# Patient Record
Sex: Male | Born: 1970 | Race: Black or African American | Hispanic: No | Marital: Single | Smoking: Former smoker
Health system: Southern US, Community
[De-identification: ages and names within clinical notes are randomized; demographics above are authoritative.]

## PROBLEM LIST (undated history)

## (undated) DIAGNOSIS — B20 Human immunodeficiency virus [HIV] disease: Secondary | ICD-10-CM

## (undated) DIAGNOSIS — Z21 Asymptomatic human immunodeficiency virus [HIV] infection status: Secondary | ICD-10-CM

## (undated) DIAGNOSIS — G629 Polyneuropathy, unspecified: Secondary | ICD-10-CM

## (undated) HISTORY — DX: Polyneuropathy, unspecified: G62.9

---

## 2021-06-12 ENCOUNTER — Emergency Department (HOSPITAL_BASED_OUTPATIENT_CLINIC_OR_DEPARTMENT_OTHER): Payer: Self-pay

## 2021-06-12 ENCOUNTER — Inpatient Hospital Stay (HOSPITAL_BASED_OUTPATIENT_CLINIC_OR_DEPARTMENT_OTHER)
Admission: EM | Admit: 2021-06-12 | Discharge: 2021-06-17 | DRG: 974 | Disposition: A | Discharge status: Home / Self Care | Payer: Self-pay | Attending: Internal Medicine | Admitting: Internal Medicine

## 2021-06-12 ENCOUNTER — Other Ambulatory Visit: Payer: Self-pay

## 2021-06-12 ENCOUNTER — Encounter (HOSPITAL_BASED_OUTPATIENT_CLINIC_OR_DEPARTMENT_OTHER): Payer: Self-pay

## 2021-06-12 DIAGNOSIS — H3322 Serous retinal detachment, left eye: Secondary | ICD-10-CM | POA: Diagnosis present

## 2021-06-12 DIAGNOSIS — T375X6A Underdosing of antiviral drugs, initial encounter: Secondary | ICD-10-CM | POA: Diagnosis present

## 2021-06-12 DIAGNOSIS — E538 Deficiency of other specified B group vitamins: Secondary | ICD-10-CM | POA: Diagnosis present

## 2021-06-12 DIAGNOSIS — Z8611 Personal history of tuberculosis: Secondary | ICD-10-CM

## 2021-06-12 DIAGNOSIS — J9601 Acute respiratory failure with hypoxia: Secondary | ICD-10-CM | POA: Diagnosis present

## 2021-06-12 DIAGNOSIS — E8809 Other disorders of plasma-protein metabolism, not elsewhere classified: Secondary | ICD-10-CM | POA: Diagnosis present

## 2021-06-12 DIAGNOSIS — R7989 Other specified abnormal findings of blood chemistry: Secondary | ICD-10-CM

## 2021-06-12 DIAGNOSIS — Z79899 Other long term (current) drug therapy: Secondary | ICD-10-CM

## 2021-06-12 DIAGNOSIS — H30892 Other chorioretinal inflammations, left eye: Secondary | ICD-10-CM | POA: Diagnosis present

## 2021-06-12 DIAGNOSIS — A419 Sepsis, unspecified organism: Principal | ICD-10-CM | POA: Diagnosis present

## 2021-06-12 DIAGNOSIS — Z91138 Patient's unintentional underdosing of medication regimen for other reason: Secondary | ICD-10-CM

## 2021-06-12 DIAGNOSIS — H5462 Unqualified visual loss, left eye, normal vision right eye: Secondary | ICD-10-CM | POA: Diagnosis present

## 2021-06-12 DIAGNOSIS — H3090 Unspecified chorioretinal inflammation, unspecified eye: Secondary | ICD-10-CM | POA: Diagnosis present

## 2021-06-12 DIAGNOSIS — Z20822 Contact with and (suspected) exposure to covid-19: Secondary | ICD-10-CM | POA: Diagnosis present

## 2021-06-12 DIAGNOSIS — J189 Pneumonia, unspecified organism: Secondary | ICD-10-CM

## 2021-06-12 DIAGNOSIS — B20 Human immunodeficiency virus [HIV] disease: Secondary | ICD-10-CM | POA: Diagnosis present

## 2021-06-12 DIAGNOSIS — R7401 Elevation of levels of liver transaminase levels: Secondary | ICD-10-CM

## 2021-06-12 DIAGNOSIS — E861 Hypovolemia: Secondary | ICD-10-CM | POA: Diagnosis present

## 2021-06-12 DIAGNOSIS — D75838 Other thrombocytosis: Secondary | ICD-10-CM | POA: Diagnosis present

## 2021-06-12 DIAGNOSIS — Z888 Allergy status to other drugs, medicaments and biological substances status: Secondary | ICD-10-CM

## 2021-06-12 DIAGNOSIS — J850 Gangrene and necrosis of lung: Secondary | ICD-10-CM | POA: Diagnosis present

## 2021-06-12 DIAGNOSIS — H547 Unspecified visual loss: Secondary | ICD-10-CM

## 2021-06-12 DIAGNOSIS — E876 Hypokalemia: Secondary | ICD-10-CM

## 2021-06-12 DIAGNOSIS — D649 Anemia, unspecified: Secondary | ICD-10-CM | POA: Diagnosis present

## 2021-06-12 DIAGNOSIS — B0089 Other herpesviral infection: Secondary | ICD-10-CM | POA: Diagnosis present

## 2021-06-12 DIAGNOSIS — E871 Hypo-osmolality and hyponatremia: Secondary | ICD-10-CM

## 2021-06-12 DIAGNOSIS — J851 Abscess of lung with pneumonia: Secondary | ICD-10-CM | POA: Diagnosis present

## 2021-06-12 DIAGNOSIS — D638 Anemia in other chronic diseases classified elsewhere: Secondary | ICD-10-CM | POA: Diagnosis present

## 2021-06-12 HISTORY — DX: Asymptomatic human immunodeficiency virus (hiv) infection status: Z21

## 2021-06-12 HISTORY — DX: Human immunodeficiency virus (HIV) disease: B20

## 2021-06-12 LAB — COMPREHENSIVE METABOLIC PANEL
ALT: 125 U/L — ABNORMAL HIGH (ref 0–44)
AST: 116 U/L — ABNORMAL HIGH (ref 15–41)
Albumin: 2 g/dL — ABNORMAL LOW (ref 3.5–5.0)
Alkaline Phosphatase: 134 U/L — ABNORMAL HIGH (ref 38–126)
Anion gap: 10 (ref 5–15)
BUN: 8 mg/dL (ref 6–20)
CO2: 27 mmol/L (ref 22–32)
Calcium: 7.9 mg/dL — ABNORMAL LOW (ref 8.9–10.3)
Chloride: 93 mmol/L — ABNORMAL LOW (ref 98–111)
Creatinine, Ser: 0.72 mg/dL (ref 0.61–1.24)
GFR, Estimated: >60 mL/min (ref >60)
Glucose, Bld: 111 mg/dL — ABNORMAL HIGH (ref 70–99)
Potassium: 3.3 mmol/L — ABNORMAL LOW (ref 3.5–5.1)
Sodium: 130 mmol/L — ABNORMAL LOW (ref 135–145)
Total Bilirubin: 0.6 mg/dL (ref 0.3–1.2)
Total Protein: 7.5 g/dL (ref 6.5–8.1)

## 2021-06-12 LAB — URINALYSIS, ROUTINE W REFLEX MICROSCOPIC
Bilirubin Urine: NEGATIVE
Glucose, UA: NEGATIVE mg/dL
Hgb urine dipstick: NEGATIVE
Ketones, ur: NEGATIVE mg/dL
Leukocytes,Ua: NEGATIVE
Nitrite: NEGATIVE
Protein, ur: 30 mg/dL — AB
Specific Gravity, Urine: 1.025 (ref 1.005–1.030)
pH: 6 (ref 5.0–8.0)

## 2021-06-12 LAB — RESP PANEL BY RT-PCR (FLU A&B, COVID) ARPGX2
Influenza A by PCR: NEGATIVE
Influenza B by PCR: NEGATIVE
SARS Coronavirus 2 by RT PCR: NEGATIVE

## 2021-06-12 LAB — URINALYSIS, MICROSCOPIC (REFLEX)

## 2021-06-12 LAB — T-HELPER CELLS (CD4) COUNT (NOT AT ARMC)
CD4 % Helper T Cell: 5 % — ABNORMAL LOW (ref 33–65)
CD4 T Cell Abs: 37 /uL — ABNORMAL LOW (ref 400–1790)

## 2021-06-12 LAB — CBC WITH DIFFERENTIAL/PLATELET
Abs Immature Granulocytes: 0.15 10*3/uL — ABNORMAL HIGH (ref 0.00–0.07)
Basophils Absolute: 0 10*3/uL (ref 0.0–0.1)
Basophils Relative: 0 %
Eosinophils Absolute: 0.3 10*3/uL (ref 0.0–0.5)
Eosinophils Relative: 2 %
HCT: 24.6 % — ABNORMAL LOW (ref 39.0–52.0)
Hemoglobin: 7.7 g/dL — ABNORMAL LOW (ref 13.0–17.0)
Immature Granulocytes: 1 %
Lymphocytes Relative: 6 %
Lymphs Abs: 0.7 10*3/uL (ref 0.7–4.0)
MCH: 25.9 pg — ABNORMAL LOW (ref 26.0–34.0)
MCHC: 31.3 g/dL (ref 30.0–36.0)
MCV: 82.8 fL (ref 80.0–100.0)
Monocytes Absolute: 0.6 10*3/uL (ref 0.1–1.0)
Monocytes Relative: 5 %
Neutro Abs: 10.9 10*3/uL — ABNORMAL HIGH (ref 1.7–7.7)
Neutrophils Relative %: 86 %
Platelets: 655 10*3/uL — ABNORMAL HIGH (ref 150–400)
RBC: 2.97 MIL/uL — ABNORMAL LOW (ref 4.22–5.81)
RDW: 13.7 % (ref 11.5–15.5)
WBC: 12.6 10*3/uL — ABNORMAL HIGH (ref 4.0–10.5)
nRBC: 0 % (ref 0.0–0.2)

## 2021-06-12 LAB — PROTIME-INR
INR: 1.3 — ABNORMAL HIGH (ref 0.8–1.2)
Prothrombin Time: 15.9 seconds — ABNORMAL HIGH (ref 11.4–15.2)

## 2021-06-12 LAB — EXPECTORATED SPUTUM ASSESSMENT W GRAM STAIN, RFLX TO RESP C

## 2021-06-12 LAB — APTT: aPTT: 38 seconds — ABNORMAL HIGH (ref 24–36)

## 2021-06-12 LAB — LACTIC ACID, PLASMA: Lactic Acid, Venous: 0.9 mmol/L (ref 0.5–1.9)

## 2021-06-12 MED ORDER — SODIUM CHLORIDE 0.9 % IV SOLN
2.0000 g | Freq: Once | INTRAVENOUS | Status: AC
  Administered 2021-06-12: 2 g via INTRAVENOUS
  Filled 2021-06-12: qty 2

## 2021-06-12 MED ORDER — CALCIUM CARBONATE 1250 (500 CA) MG PO TABS
1000.0000 mg | ORAL_TABLET | Freq: Every day | ORAL | Status: DC
  Administered 2021-06-13 – 2021-06-15 (×3): 1000 mg via ORAL
  Filled 2021-06-12 (×3): qty 1

## 2021-06-12 MED ORDER — VANCOMYCIN HCL 1250 MG/250ML IV SOLN
1250.0000 mg | Freq: Two times a day (BID) | INTRAVENOUS | Status: DC
  Administered 2021-06-13 (×2): 1250 mg via INTRAVENOUS
  Filled 2021-06-12 (×4): qty 250

## 2021-06-12 MED ORDER — ACETAMINOPHEN 325 MG PO TABS
650.0000 mg | ORAL_TABLET | Freq: Once | ORAL | Status: AC
  Administered 2021-06-12: 650 mg via ORAL
  Filled 2021-06-12: qty 2

## 2021-06-12 MED ORDER — ENOXAPARIN SODIUM 40 MG/0.4ML IJ SOSY
40.0000 mg | PREFILLED_SYRINGE | INTRAMUSCULAR | Status: DC
  Administered 2021-06-13 – 2021-06-17 (×5): 40 mg via SUBCUTANEOUS
  Filled 2021-06-12 (×5): qty 0.4

## 2021-06-12 MED ORDER — ACETAMINOPHEN 500 MG PO TABS
1000.0000 mg | ORAL_TABLET | Freq: Once | ORAL | Status: AC
  Administered 2021-06-12: 1000 mg via ORAL
  Filled 2021-06-12: qty 2

## 2021-06-12 MED ORDER — SODIUM CHLORIDE 0.9 % IV SOLN: INTRAVENOUS | Status: DC

## 2021-06-12 MED ORDER — SODIUM CHLORIDE 0.9 % IV SOLN
2.0000 g | Freq: Three times a day (TID) | INTRAVENOUS | Status: DC
  Administered 2021-06-12 – 2021-06-13 (×3): 2 g via INTRAVENOUS
  Filled 2021-06-12 (×3): qty 2

## 2021-06-12 MED ORDER — SODIUM CHLORIDE 0.9 % IV BOLUS
1000.0000 mL | Freq: Once | INTRAVENOUS | Status: AC
  Administered 2021-06-12: 1000 mL via INTRAVENOUS

## 2021-06-12 MED ORDER — VANCOMYCIN HCL IN DEXTROSE 1-5 GM/200ML-% IV SOLN
1000.0000 mg | Freq: Once | INTRAVENOUS | Status: AC
  Administered 2021-06-12: 1000 mg via INTRAVENOUS
  Filled 2021-06-12: qty 200

## 2021-06-12 MED ORDER — DEXTROSE 5 % IV SOLN
10.0000 mg/kg | Freq: Three times a day (TID) | INTRAVENOUS | Status: DC
  Administered 2021-06-12: 745 mg via INTRAVENOUS
  Filled 2021-06-12 (×4): qty 14.9

## 2021-06-12 MED ORDER — ACYCLOVIR 800 MG PO TABS
800.0000 mg | ORAL_TABLET | Freq: Three times a day (TID) | ORAL | Status: DC
  Filled 2021-06-12: qty 1

## 2021-06-12 MED ORDER — SODIUM CHLORIDE 0.9 % IV SOLN: INTRAVENOUS | Status: DC | PRN

## 2021-06-12 NOTE — Assessment & Plan Note (Addendum)
Sepsis physiology has resolved-continues to have intermittent fever-which is to be expected given advanced HIV-and multiple ongoing infections-has been restarted on antiretroviral so could have IRIS as well.  Multiple differentials for lung abscess -CMV, recurrent Mycobacterium kansasii infection-and usual bacterial organisms.  Sputum cultures negative-AFB smears negative so far.  Discussed with infectious disease-transition from Unasyn to Augmentin x4 weeks on discharge.  Will need to repeat CT chest post treatment to assess response to treatment.

## 2021-06-12 NOTE — ED Notes (Signed)
Patient transported to CT 

## 2021-06-12 NOTE — ED Notes (Signed)
Report given to [facility] 

## 2021-06-12 NOTE — ED Notes (Signed)
[facility] in to transfer. Report given to [facility] charge nurse

## 2021-06-12 NOTE — ED Provider Notes (Addendum)
[facility] EMERGENCY DEPARTMENT Provider Note   CSN: [id number] Arrival date & time: 06/12/21  1014     History  Chief Complaint  Patient presents with   Blurred Vision   Cough    [name] is a 51 y.o. male.  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  Was getting treatment while incarcerated but he has been out of prison for about a year and has not been able to follow-up with his infectious disease team at [facility].  He is still not been able to get in to see them but maybe has an appointment for April.  He is not happy with their care there.  He has had a bad cough for the last 2 weeks and having smelly brown sputum.  Last week he started to have some blurry vision in his left eye and then eventually completely lost vision in the left eye.  He started to feel the same symptoms in his right eye and now but still has vision in the right eye.  He has no headaches, no neck pain, no eye pain.  He is having some right-sided chest wall pain when he coughs.  He is having chills and body aches at times.  The history is provided by the patient.  Cough Cough characteristics:  Productive Sputum characteristics:  [place] Severity:  Moderate Onset quality:  Gradual Duration:  2 weeks Timing:  Constant Progression:  Unchanged Chronicity:  New Relieved by:  Nothing Worsened by:  Nothing Ineffective treatments:  None tried Associated symptoms: chest pain, chills, fever, myalgias and shortness of breath   Associated symptoms: no diaphoresis, no ear fullness, no ear pain, no eye discharge, no headaches, no rash, no rhinorrhea, no sinus congestion, no sore throat, no weight loss and no wheezing       Home Medications Prior to Admission medications   Not on File      Allergies    Dapsone and Primaquine    Review of Systems   Review of Systems  Constitutional:  Positive for chills and fever. Negative for diaphoresis and weight loss.   HENT:  Negative for ear pain, rhinorrhea and sore throat.   Eyes:  Negative for discharge.  Respiratory:  Positive for cough and shortness of breath. Negative for wheezing.   Cardiovascular:  Positive for chest pain.  Musculoskeletal:  Positive for myalgias.  Skin:  Negative for rash.  Neurological:  Negative for headaches.   Physical Exam Updated Vital Signs  ED Triage Vitals  Enc Vitals Group     BP 06/12/21 1051 (!) 145/76     Pulse Rate 06/12/21 1051 84     Resp 06/12/21 1051 20     Temp 06/12/21 1051 100.3 F (37.9 C)     Temp Source 06/12/21 1051 Oral     SpO2 06/12/21 1051 97 %     Weight 06/12/21 1029 164 lb (74.4 kg)     Height 06/12/21 1029 6' (1.829 m)     Head Circumference --      Peak Flow --      Pain Score 06/12/21 1028 7     Pain Loc --      Pain Edu? --      Excl. in [place]? --     Physical Exam Vitals and nursing note reviewed.  Constitutional:      General: He is not in acute distress.    Appearance: He is well-developed. He  is ill-appearing.  HENT:     Head: Normocephalic and atraumatic.     Nose: Nose normal.     Mouth/Throat:     Mouth: Mucous membranes are moist.  Eyes:     Extraocular Movements: Extraocular movements intact.     Conjunctiva/sclera: Conjunctivae normal.     Comments: He appears to have 20/20 vision in the right eye, he is unable to see light or any movement in the left eye, pupil is black bilaterally, no obvious cataracts, left pupil is minimally reactive and right pupil is reactive and equal to light  Cardiovascular:     Rate and Rhythm: Normal rate and regular rhythm.     Pulses: Normal pulses.     Heart sounds: Normal heart sounds. No murmur heard. Pulmonary:     Effort: No respiratory distress.     Comments: Increased work of breathing with diminished right breath sounds compared to left Abdominal:     Palpations: Abdomen is soft.     Tenderness: There is no abdominal tenderness.  Musculoskeletal:        General:  Tenderness present. No swelling.     Cervical back: Normal range of motion and neck supple.     Comments: Tenderness over right anterior chest wall  Skin:    General: Skin is warm and dry.     Capillary Refill: Capillary refill takes less than 2 seconds.  Neurological:     General: No focal deficit present.     Mental Status: He is alert and oriented to person, place, and time.     Cranial Nerves: No cranial nerve deficit.     Sensory: No sensory deficit.     Motor: No weakness.     Coordination: Coordination normal.     Comments: 5+ out of 5 strength, normal sensation  Psychiatric:        Mood and Affect: Mood normal.    ED Results / Procedures / Treatments   Labs (all labs ordered are listed, but only abnormal results are displayed) Labs Reviewed  CBC WITH DIFFERENTIAL/PLATELET - Abnormal; Notable for the following components:      Result Value   WBC 12.6 (*)    RBC 2.97 (*)    Hemoglobin 7.7 (*)    HCT 24.6 (*)    MCH 25.9 (*)    Platelets 655 (*)    Neutro Abs 10.9 (*)    Abs Immature Granulocytes 0.15 (*)    All other components within normal limits  COMPREHENSIVE METABOLIC PANEL - Abnormal; Notable for the following components:   Sodium 130 (*)    Potassium 3.3 (*)    Chloride 93 (*)    Glucose, Bld 111 (*)    Calcium 7.9 (*)    Albumin 2.0 (*)    AST 116 (*)    ALT 125 (*)    Alkaline Phosphatase 134 (*)    All other components within normal limits  PROTIME-INR - Abnormal; Notable for the following components:   Prothrombin Time 15.9 (*)    INR 1.3 (*)    All other components within normal limits  APTT - Abnormal; Notable for the following components:   aPTT 38 (*)    All other components within normal limits  RESP PANEL BY RT-PCR (FLU A&B, COVID) ARPGX2  CULTURE, BLOOD (ROUTINE X 2)  CULTURE, BLOOD (ROUTINE X 2)  URINE CULTURE  EXPECTORATED SPUTUM ASSESSMENT W GRAM STAIN, RFLX TO RESP C  ACID FAST SMEAR (AFB, MYCOBACTERIA)  ACID FAST  CULTURE WITH  REFLEXED SENSITIVITIES (MYCOBACTERIA)  LACTIC ACID, PLASMA  T-HELPER CELLS (CD4) COUNT (NOT AT [facility])  HIV-1 RNA QUANT-NO REFLEX-BLD  URINALYSIS, ROUTINE W REFLEX MICROSCOPIC  QUANTIFERON-TB GOLD PLUS    EKG EKG Interpretation  Date/Time:  Friday June 12 2021 10:57:11 EST Ventricular Rate:  98 PR Interval:  134 QRS Duration: 94 QT Interval:  341 QTC Calculation: 427 R Axis:   84 Text Interpretation: Sinus rhythm Paired ventricular premature complexes Confirmed by Lennice Sites ([id number]) on 06/12/2021 11:07:02 AM  Radiology CT Head Wo Contrast  Result Date: 06/12/2021 CLINICAL DATA:  Vision loss.  No reported injury. EXAM: CT HEAD WITHOUT CONTRAST TECHNIQUE: Contiguous axial images were obtained from the base of the skull through the vertex without intravenous contrast. RADIATION DOSE REDUCTION: This exam was performed according to the departmental dose-optimization program which includes automated exposure control, adjustment of the mA and/or kV according to patient size and/or use of iterative reconstruction technique. COMPARISON:  10/05/2015 head CT. FINDINGS: Brain: No evidence of parenchymal hemorrhage or extra-axial fluid collection. No mass lesion, mass effect, or midline shift. No CT evidence of acute infarction. Cerebral volume is age appropriate. No ventriculomegaly. Vascular: No acute abnormality. Skull: No evidence of calvarial fracture. Sinuses/Orbits: The visualized paranasal sinuses are essentially clear. Heterogeneous mild hyperdensity is seen layering in the vitreous of the globes bilaterally, left greater than right (series 2/image 5). Other:  The mastoid air cells are unopacified. IMPRESSION: 1. No evidence of acute intracranial abnormality. 2. Heterogeneous mild hyperdensity layering in the vitreous of the globes bilaterally, left greater than right, cannot exclude vitreous hemorrhage. Electronically Signed   By: Ilona Sorrel M.D.   On: 06/12/2021 11:27   DG Chest Portable 1  View  Result Date: 06/12/2021 CLINICAL DATA:  Cough shortness of breath EXAM: PORTABLE CHEST 1 VIEW COMPARISON:  10/05/2015 FINDINGS: There is moderate to large alveolar infiltrate in the medial right lower lung fields. Right cardiac margin is preserved. Rest of the lung fields are clear. There is no pleural effusion or pneumothorax. IMPRESSION: There is new large infiltrate in the right lower lung fields suggesting possible pneumonia in the right lower lobe. Electronically Signed   By: Elmer Picker M.D.   On: 06/12/2021 11:21    Procedures .Critical Care Performed by: Lennice Sites, DO Authorized by: Lennice Sites, DO   Critical care provider statement:    Critical care time (minutes):  45   Critical care was necessary to treat or prevent imminent or life-threatening deterioration of the following conditions:  Sepsis   Critical care was time spent personally by me on the following activities:  Blood draw for specimens, development of treatment plan with patient or surrogate, discussions with consultants, evaluation of patient's response to treatment, examination of patient, obtaining history from patient or surrogate, ordering and performing treatments and interventions, ordering and review of laboratory studies, ordering and review of radiographic studies, pulse oximetry, re-evaluation of patient's condition, review of old charts and discussions with primary provider    Medications Ordered in ED Medications  0.9 %  sodium chloride infusion ( Intravenous New Bag/Given 06/12/21 1210)  ceFEPIme (MAXIPIME) 2 g in sodium chloride 0.9 % 100 mL IVPB (has no administration in time range)  vancomycin (VANCOREADY) IVPB 1250 mg/250 mL (has no administration in time range)  vancomycin (VANCOCIN) IVPB 1000 mg/200 mL premix (0 mg Intravenous Stopped 06/12/21 1347)  ceFEPIme (MAXIPIME) 2 g in sodium chloride 0.9 % 100 mL IVPB (0 g Intravenous Stopped 06/12/21 1347)  acetaminophen (TYLENOL) tablet 1,000 mg  (1,000 mg Oral Given 06/12/21 1142)  sodium chloride 0.9 % bolus 1,000 mL (0 mLs Intravenous Stopped 06/12/21 1348)    ED Course/ Medical Decision Making/ A&P                           Medical Decision Making Amount and/or Complexity of Data Reviewed Labs: ordered. Radiology: ordered. ECG/medicine tests: ordered.  Risk OTC drugs. Prescription drug management. Decision regarding hospitalization.   Muzamil Kawecki is here with cough and vision loss.  Patient with history of HIV but no longer on treatment for the past year.  Arrives with fever of 100.7.  Hypoxic to the 80s that improved on 2 L of oxygen.  But otherwise normal vitals.  Patient states that he was getting HIV treatment in prison but when he was released a year ago he has not been able to follow-up with any medical providers.  Over the last 2 weeks has had cough with smelly brown sputum production.  Last week he lost vision in his left eye.  He was having several days of some blurred vision and now he is unable to see any light out of his left eye.  He started to feel the same symptoms in the right eye but still is able to see.  He denies any headache, eye pain, abdominal pain, nausea, vomiting.  But he has had chills and body aches.  He has had pain to the right side of his ribs when he coughs.  On exam he is unable to see light in the left eye.  Pupils minimally reactive.  Maybe there is some corneal clouding.  Hard to get a good funduscopic exam.  Right eye he appears to have 20/20 vision but he states he is having some difficulty focusing but does not appear to have double vision on testing.  Patient otherwise neurologically intact.  He has no neck stiffness and normal strength and sensation throughout.  Normal speech.  Normal extraocular movement.  Diminished breath sounds more so on the right.  Overall given fever and immunocompromise state likely in history of tuberculosis we will get sepsis work-up and start broad-spectrum IV  antibiotics with vancomycin and cefepime.  Patient's is able to produce sputum and we will send that off for evaluation.  We will also send off HIV testing as well as QuantiFERON testing.  Patient to get a head CT given vision loss.  I have a lower suspicion for stroke.  Differential includes sepsis likely from pulmonary source versus acute HIV versus tuberculosis.  Patient possibly with some sort of HIV related vision loss, will touch base with ophthalmology.  May need an MRI to fully rule out stroke upon admission.  Awaiting blood work and then will touch base with infectious disease and anticipate admission to medicine team.  We will talk with ophthalmology as well. Per Chart review, he was treat for myobacterium kansassi few years ago.  Talked with Dr. Katy Fitch with ophthalmology.  He states high on his differential would be endophthalmitis/some sort of HIV retinitis.  He is not a retinal specialist and there is no retinal specialist on-call this weekend and really thinks patient would be safer to be transported to/excepted to a larger healthcare system with those types of ophthalmology specialist.  Awaiting lab work and will talk with outside facilities for admission.  Per my review of chest x-ray patient with right lower lobe infiltrate.  Overall suspect sepsis  from pneumonia.  We will touch base with infectious disease about further antibiotic recommendations.  I have had secretary reach out to ophthalmology team at [facility] as well to try to start to initiate transfer.  1200pm I talked with Dr. Linus Salmons with infectious disease who is okay with IV antibiotic treatment at this time with vancomycin and cefepime.  1215pm I talked with Dr. Gayleen Orem from ophthalmology from [facility] who is happy to see the patient in consultation.  We will try to get her transferred to inpatient bed at [facility].  I have talked with the patient in transfer line and I am awaiting callback.  For my interpretation of labs  patient with mild leukocytosis of 12.6.  Hemoglobin 7.7.  Patient has mild elevation of liver enzymes but no acute kidney injury.  Blood cultures pending.  Sputum cultures pending.  1240 pm [facility] call line states that they are at capacity.  Not accepting outside transfers, not accepting patients on wait list either. I have now put in for calls for [facility] and [facility] which are the only other ophthalmology programs in the state.  115 pm [facility] or [facility] also or not accepting outside transfers or placing patients on wait list.  I have talked with Dr. Tyrone Nine with ED at [facility].  We will do lateral ED to ED transfer.  Dr. Katy Fitch will come to the ED to evaluate patient and do exam to determine urgency of his vision loss.  Suspicion that this is from vitreous hemorrhage.  Overall I am unable to admit him to outside facilities due to capacity issues.  Will have ophthalmology evaluate the patient and if there is emergent finding and need for immediate intervention will need to likely try to push for an ED to ED transfer to these outside facilities for ophthalmology consultation.  Overall he will need also treatment of his sepsis and pneumonia.  I have talked with Dr. Tyrone Nine who is aware of the plan.   When [facility] ED please page Dr. Katy Fitch to let him know that he has arrived.  This chart was dictated using voice recognition software.  Despite best efforts to proofread,  errors can occur which can change the documentation meaning.         Final Clinical Impression(s) / ED Diagnoses Final diagnoses:  Sepsis, due to unspecified organism, unspecified whether acute organ dysfunction present [facility])  Acute respiratory failure with hypoxia ([place])  Vision loss  Community acquired pneumonia of right lower lobe of lung    Rx / DC Orders ED Discharge Orders     None         Lennice Sites, DO 06/12/21 [place], [place], DO 06/12/21 [place], [place], DO 06/12/21 1417

## 2021-06-12 NOTE — ED Provider Notes (Signed)
[facility] EMERGENCY DEPARTMENT Provider Note   CSN: [id number] Arrival date & time: 06/12/21  1014     History  Chief Complaint  Patient presents with   Blurred Vision   Cough    Mason Moran is a 51 y.o. male.  Patient presents as a transfer from outside facility.  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  Presents with complaint of cough and vision loss in left eye.  Cough ongoing for about 2 weeks.  Vision loss in the left eye perhaps for several weeks exact timeframe unclear as the patient gives different time frames on different interviews.  Found to be febrile with vision loss in the left eye transferred here from outside facility for ophthalmology evaluation.      Home Medications Prior to Admission medications   Not on File      Allergies    Dapsone and Primaquine    Review of Systems   Review of Systems  Constitutional:  Positive for fever.  HENT:  Negative for ear pain and sore throat.   Eyes:  Negative for discharge.  Respiratory:  Positive for cough.   Cardiovascular:  Negative for chest pain.  Gastrointestinal:  Negative for abdominal pain.  Genitourinary:  Negative for flank pain.  Musculoskeletal:  Negative for back pain.  Skin:  Negative for color change and rash.  Neurological:  Negative for syncope.  All other systems reviewed and are negative.  Physical Exam Updated Vital Signs BP 109/68    Pulse 78    Temp 98.2 F (36.8 C)    Resp (!) 24    Ht 6' (1.829 m)    Wt 74.4 kg    SpO2 99%    BMI 22.24 kg/m  Physical Exam Constitutional:      Appearance: He is well-developed.  HENT:     Head: Normocephalic.     Nose: Nose normal.  Eyes:     Extraocular Movements: Extraocular movements intact.     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  Left eye is fixed and unresponsive to light.  Right eye is equal round reactive and extraocular motions intact.  Conjunctiva clear  bilaterally.  Cardiovascular:     Rate and Rhythm: Normal rate.  Pulmonary:     Effort: Pulmonary effort is normal.  Skin:    Coloration: Skin is not jaundiced.  Neurological:     Mental Status: He is alert. Mental status is at baseline.    ED Results / Procedures / Treatments   Labs (all labs ordered are listed, but only abnormal results are displayed) Labs Reviewed  CBC WITH DIFFERENTIAL/PLATELET - Abnormal; Notable for the following components:      Result Value   WBC 12.6 (*)    RBC 2.97 (*)    Hemoglobin 7.7 (*)    HCT 24.6 (*)    MCH 25.9 (*)    Platelets 655 (*)    Neutro Abs 10.9 (*)    Abs Immature Granulocytes 0.15 (*)    All other components within normal limits  T-HELPER CELLS (CD4) COUNT (NOT AT [facility]) - Abnormal; Notable for the following components:   CD4 T Cell Abs 37 (*)    CD4 % Helper T Cell 5 (*)    All other components within normal limits  COMPREHENSIVE METABOLIC PANEL - Abnormal; Notable for the following components:   Sodium 130 (*)    Potassium  3.3 (*)    Chloride 93 (*)    Glucose, Bld 111 (*)    Calcium 7.9 (*)    Albumin 2.0 (*)    AST 116 (*)    ALT 125 (*)    Alkaline Phosphatase 134 (*)    All other components within normal limits  PROTIME-INR - Abnormal; Notable for the following components:   Prothrombin Time 15.9 (*)    INR 1.3 (*)    All other components within normal limits  APTT - Abnormal; Notable for the following components:   aPTT 38 (*)    All other components within normal limits  URINALYSIS, ROUTINE W REFLEX MICROSCOPIC - Abnormal; Notable for the following components:   Color, Urine AMBER (*)    Protein, ur 30 (*)    All other components within normal limits  URINALYSIS, MICROSCOPIC (REFLEX) - Abnormal; Notable for the following components:   Bacteria, UA FEW (*)    All other components within normal limits  RESP PANEL BY RT-PCR (FLU A&B, COVID) ARPGX2  CULTURE, BLOOD (ROUTINE X 2)  CULTURE, BLOOD (ROUTINE X 2)   URINE CULTURE  EXPECTORATED SPUTUM ASSESSMENT W GRAM STAIN, RFLX TO RESP C  ACID FAST SMEAR (AFB, MYCOBACTERIA)  ACID FAST CULTURE WITH REFLEXED SENSITIVITIES (MYCOBACTERIA)  LACTIC ACID, PLASMA  HIV-1 RNA QUANT-NO REFLEX-BLD  QUANTIFERON-TB GOLD PLUS    EKG EKG Interpretation  Date/Time:  Friday June 12 2021 10:57:11 EST Ventricular Rate:  98 PR Interval:  134 QRS Duration: 94 QT Interval:  341 QTC Calculation: 427 R Axis:   84 Text Interpretation: Sinus rhythm Paired ventricular premature complexes Confirmed by Lennice Sites ([id number]) on 06/12/2021 11:07:02 AM  Radiology CT Head Wo Contrast  Result Date: 06/12/2021 CLINICAL DATA:  Vision loss.  No reported injury. EXAM: CT HEAD WITHOUT CONTRAST TECHNIQUE: Contiguous axial images were obtained from the base of the skull through the vertex without intravenous contrast. RADIATION DOSE REDUCTION: This exam was performed according to the departmental dose-optimization program which includes automated exposure control, adjustment of the mA and/or kV according to patient size and/or use of iterative reconstruction technique. COMPARISON:  10/05/2015 head CT. FINDINGS: Brain: No evidence of parenchymal hemorrhage or extra-axial fluid collection. No mass lesion, mass effect, or midline shift. No CT evidence of acute infarction. Cerebral volume is age appropriate. No ventriculomegaly. Vascular: No acute abnormality. Skull: No evidence of calvarial fracture. Sinuses/Orbits: The visualized paranasal sinuses are essentially clear. Heterogeneous mild hyperdensity is seen layering in the vitreous of the globes bilaterally, left greater than right (series 2/image 5). Other:  The mastoid air cells are unopacified. IMPRESSION: 1. No evidence of acute intracranial abnormality. 2. Heterogeneous mild hyperdensity layering in the vitreous of the globes bilaterally, left greater than right, cannot exclude vitreous hemorrhage. Electronically Signed   By: Ilona Sorrel M.D.   On: 06/12/2021 11:27   DG Chest Portable 1 View  Result Date: 06/12/2021 CLINICAL DATA:  Cough shortness of breath EXAM: PORTABLE CHEST 1 VIEW COMPARISON:  10/05/2015 FINDINGS: There is moderate to large alveolar infiltrate in the medial right lower lung fields. Right cardiac margin is preserved. Rest of the lung fields are clear. There is no pleural effusion or pneumothorax. IMPRESSION: There is new large infiltrate in the right lower lung fields suggesting possible pneumonia in the right lower lobe. Electronically Signed   By: Elmer Picker M.D.   On: 06/12/2021 11:21    Procedures Procedures    Medications Ordered in ED Medications  0.9 %  sodium chloride infusion (0 mLs Intravenous Stopped 06/12/21 1626)  ceFEPIme (MAXIPIME) 2 g in sodium chloride 0.9 % 100 mL IVPB (has no administration in time range)  vancomycin (VANCOREADY) IVPB 1250 mg/250 mL (has no administration in time range)  vancomycin (VANCOCIN) IVPB 1000 mg/200 mL premix (0 mg Intravenous Stopped 06/12/21 1347)  ceFEPIme (MAXIPIME) 2 g in sodium chloride 0.9 % 100 mL IVPB (0 g Intravenous Stopped 06/12/21 1347)  acetaminophen (TYLENOL) tablet 1,000 mg (1,000 mg Oral Given 06/12/21 1142)  sodium chloride 0.9 % bolus 1,000 mL (0 mLs Intravenous Stopped 06/12/21 1348)    ED Course/ Medical Decision Making/ A&P                           Medical Decision Making Amount and/or Complexity of Data Reviewed Labs: ordered. Radiology: ordered. ECG/medicine tests: ordered.  Risk OTC drugs. Prescription drug management.   Test from outside facility reviewed.  Patient appears to have right-sided pneumonia and is hypoxic here requiring 2 L nasal cannula support.  He was already started on IV cefepime and vancomycin.  Prior physician had consulted infectious disease as well as ophthalmology.  I repaged ophthalmology Dr. Katy Fitch upon patient's arrival here, he was evaluated by ophthalmology and they feel he is stable for  admission here recommending infectious disease consultation and medical admission.  Medicine has been consulted for admission.          Final Clinical Impression(s) / ED Diagnoses Final diagnoses:  Sepsis, due to unspecified organism, unspecified whether acute organ dysfunction present [facility])  Acute respiratory failure with hypoxia ([facility])  Vision loss  Community acquired pneumonia of right lower lobe of lung    Rx / DC Orders ED Discharge Orders     None         Luna Fuse, MD 06/12/21 1800

## 2021-06-12 NOTE — Assessment & Plan Note (Addendum)
Mild borderline hypercalcemia due to hypoalbuminemia-follow closely.

## 2021-06-12 NOTE — ED Notes (Signed)
Pt resting on stretcher, denies any complaints at present. Pt states he wants more food but when he goes upstairs. Pt assisted to turn on the TV and shown how to use the call bell and change the channels. No acute changes noted. Will continue to monitor.

## 2021-06-12 NOTE — Assessment & Plan Note (Addendum)
Mild hyponatremia on admission likely due to hypovolemia-responded to IVF.  Follow electrolytes closely.

## 2021-06-12 NOTE — Progress Notes (Signed)
Pharmacy Antibiotic Note  Mason Moran is a 51 y.o. male admitted on 06/12/2021 with pneumonia. Pharmacy has been consulted for vancomycin and cefepime dosing. Pt with Tmax 100.3 and WBC is elevated at 12.6. Scr and lactic acid are WNL.   Plan: Vancomycin 1gm IV x 1 then 1250mg  IV Q12H Cefepime 2gm IV Q8H F/u renal fxn, C&S, clinical status and peak/trough at SS  Height: 6' (182.9 cm) Weight: 74.4 kg (164 lb) IBW/kg (Calculated) : 77.6  Temp (24hrs), Avg:100.3 F (37.9 C), Min:100.3 F (37.9 C), Max:100.3 F (37.9 C)  Recent Labs  Lab 06/12/21 1130  WBC 12.6*  CREATININE 0.72  LATICACIDVEN 0.9    Estimated Creatinine Clearance: 115 mL/min (by C-G formula based on SCr of 0.72 mg/dL).    Allergies  Allergen Reactions   Dapsone     Other reaction(s): Other (See Comments) G6PD deficiency   Primaquine     Other reaction(s): Other (See Comments) G6PD deficiency    Antimicrobials this admission: Vanc 2/3>> Cefepime 2/3>>  Dose adjustments this admission: N/A  Microbiology results: Pending  Thank you for allowing pharmacy to be a part of this patients care.  Mason Moran, 08/10/21 06/12/2021 10:53 AM

## 2021-06-12 NOTE — Assessment & Plan Note (Addendum)
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  AFB smear x3 negative-please follow sputum AFB cultures until final.

## 2021-06-12 NOTE — Assessment & Plan Note (Addendum)
Recently incarcerated-off antiretrovirals for several years.  ID consulted-has been started on Biktarvy.  On Bactrim for PJP prophylaxis.

## 2021-06-12 NOTE — Consult Note (Signed)
Ophthalmology Initial Consult Note  Mason Moran, 51 y.o. male Date of Service:  06/12/2021   Requesting physician: Cheryll Cockayne, MD  Information Obtained from:  Chief Complaint:  Loss of vision left eye  HPI/Discussion:  Mason Moran is a 51 y.o. male who presents as an ER transfer for loss of vision in the left eye. He has a complex history that includes HIV. He is currently being admitted for pneumonia and sepsis evaluation. He says the vision has been "gone" in the left eye for about 6 weeks. He is worried that he is now losing vision in the right eye. He reports no pain.  Past Ocular Hx:  Could not obtain Ocular Meds:  None Family ocular history: Noncontributory  Past Medical History:  Diagnosis Date   HIV (human immunodeficiency virus infection) (HCC)    History reviewed. No pertinent surgical history.  Prior to Admission Meds: (Not in a hospital admission)   Inpatient Meds: See chart  Allergies  Allergen Reactions   Dapsone     Other reaction(s): Other (See Comments) G6PD deficiency   Primaquine     Other reaction(s): Other (See Comments) G6PD deficiency   Social History   Tobacco Use   Smoking status: Never   Smokeless tobacco: Never  Substance Use Topics   Alcohol use: Not Currently    Comment: none in a month   No family history on file.  ROS: Other than ROS in the HPI, all other systems were negative.  Exam: Temp: 98.2 F (36.8 C) Pulse Rate: 88 BP: 105/67 Resp: (!) 23 SpO2: 96 %  Visual Acuity:   near   OD  20/40   OS  NLP - confirmed      OD OS  Confr Vis Fields WNL Extinguished  EOM (Primary) Full Full  Lids/Lashes Normal Normal  Conjunctiva - Bulbar WNL WNL  Conjunctiva - Palpebral               WNL WNL  Adnexa  WNL WNL  Pupils  3 --> 2, no rAPD 3 --> 2, definite rAPD  Cornea  Clear Clear, scattered fine endopigment  Anterior Chamber Formed, grossly quiet Near-360 posterior synechiae; AC trace cell  Lens:  Clear Cataract  IOP  (tonopen) 19 12  Fundus - Dilated? YES   Optic Disc - C:D Ratio 0.6 No view                     Appearance  Normal                      NF Layer Normal   Post Seg:  Retina                    Vessels Normal caliber                   Vitreous  Clear                   Macula Normal, good FLR                   Periphery Normal, no whitening        Neuro:  Oriented to person, place, and time:  Yes Psychiatric:  Mood and Affect Appropriate:  Yes  Labs/imaging:   A/P:  51 y.o. male with HIV and concern for sepsis:  1) Evidence of anterior uveitis OS  2) Vitreous debris OS on CT - no view  due to tiny pupil and posterior synechiae. - Could be vitreous hemorrhage vs burned out retinitis vs other. B-scan looks most consistent with retinal detachment. 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 Also, with NLP vision there is unfortunately not much left to do for the eye at this time. - I would consider prophylaxis for the good eye (e.g. systemic valacyclovir or equivalent). Recommend ID consult while hospitalized here. - Discussed with retinal specialist Arlys John), who has agreed to see him in the office. - Please have patient notify me over the weekend if he has any loss of vision in the good (right) eye.  Baldwin Jamaica, MD [phone]  R Fabian Sharp, MD 06/12/2021, 4:46 PM

## 2021-06-12 NOTE — Assessment & Plan Note (Addendum)
Unclear etiology-could be related to underlying infectious issue affecting eyes/lung-acute hepatitis serology was negative.  RUQ ultrasound without any acute liver issues.  Thankfully transaminitis slowly improving.

## 2021-06-12 NOTE — ED Notes (Signed)
Pt given urinal and specimen cup for sputum specimen.

## 2021-06-12 NOTE — Assessment & Plan Note (Addendum)
Repleted. °

## 2021-06-12 NOTE — Progress Notes (Signed)
Pharmacy Antibiotic Note  Mason Moran is a 51 y.o. 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  Pharmacy has been consulted for IV acyclovir dosing.  WBC elevated at 12.6. SCr wnl.   Plan: -Start acyclovir 745 mg (10 mg/kg) IV Q 8 hours -Monitor renal fx closely --Vancomycin 1gm IV x 1 then 1250mg  IV Q12H. Per discussion with MD, will order MRSA PCR. If neg, will consider d/c'ing vancomycin to avoid nephrotoxicity  -Cefepime 2gm IV Q8H -F/u renal fxn, C&S, clinical status and peak/trough at SS   Height: 6' (182.9 cm) Weight: 74.4 kg (164 lb) IBW/kg (Calculated) : 77.6  Temp (24hrs), Avg:99.2 F (37.3 C), Min:98.2 F (36.8 C), Max:100.3 F (37.9 C)  Recent Labs  Lab 06/12/21 1130  WBC 12.6*  CREATININE 0.72  LATICACIDVEN 0.9    Estimated Creatinine Clearance: 115 mL/min (by C-G formula based on SCr of 0.72 mg/dL).    Allergies  Allergen Reactions   Dapsone Other (See Comments)    Other reaction(s): Other (See Comments) G6PD deficiency   Other Other (See Comments)    Seafood - can't speak   Primaquine     Other reaction(s): Other (See Comments) G6PD deficiency    Antimicrobials this admission: Vanc 2/3>> Cefepime 2/3>> Acyclovir 2/3 >>    Dose adjustments this admission: N/A   Microbiology results: Pending  Thank you for allowing pharmacy to be a part of this patients care.  08/10/21, PharmD., BCCCP Clinical Pharmacist Please refer to [facility] for unit-specific pharmacist

## 2021-06-12 NOTE — ED Notes (Signed)
Patient transported to X-ray 

## 2021-06-12 NOTE — ED Notes (Signed)
[facility] Op[facility]

## 2021-06-12 NOTE — H&P (Signed)
Mason Moran is an 51 y.o. male.   Chief Complaint:  Chief Complaint  Patient presents with   Blurred Vision   Cough    Today 06/12/21 is hospital day 0 after admission on 06/12/2021 10:22 AM  RECORD REVIEW AND HOSPITAL COURSE: Patient presented this morning to ED with chief complaint of cough over the past 2 weeks productive of brown sputum, also concerned about vision loss in left eye over the past 6 weeks or so.  History of HIV untreated since being released from prison about a year ago, history of TB in the past.  Reports some right-sided chest pain with cough, reports some chills/body aches.  Was initially at the emergency department in [facility] but transferred to [facility] to obtain ophthalmological evaluation for left eye blindness, initial vision screening 20/20 vision in right eye, no ability to see light/movement in left eye, left pupil minimally reactive, right pupil reactive.  CT showed likely vitreous hemorrhage, ophthalmology was concerned for left retinal detachment, no intervention would be helpful at this time but did recommend valacyclovir for preservation of right vision and call if needed otherwise patient can follow-up upon discharge.  In the ED, patient also received IV fluids, started on cefepime and vancomycin, given acetaminophen for pleuritic chest pain not cough which patient states was helpful.  He was having decreased oxygen saturation down to 88% on room air but has been doing well on 2 L nasal cannula  Procedures and Significant Results:  CXR 06/12/21 "There is new large infiltrate in the right lower lung fields suggesting possible pneumonia in the right lower lobe." CT Head 06/12/21 "Heterogeneous mild hyperdensity layering in the vitreous of the globes bilaterally, left greater than right, cannot exclude vitreous hemorrhage."  Consultants:  Ophthalmology saw patient in ED 06/12/2021 Infectious disease    SUBJECTIVE:  Patient seen and examined today in the  ED, reports overall feeling chest pain cough.  Patient said, patient has been about 6 weeks at this point.  At time of my interview/exam, he was most concerned about getting something to eat, and was happy to have a sandwich.      ASSESSMENT & PLAN  Sepsis due to pneumonia (HCC) RLL on CXR, hx TB -TB Quantiferon pending, CXR c/w RLL CAP no obvious granulomatous disease -ID consult pending -continue Vancomycine, Cefepime  -Consider f/u CXR pending clinical improvement -O2 via [place] at this time, continue to monitor and alert MD/DO if concern for needing escalation  -continue IV fluids for now, pt is eating may be able to come off IV soon   HIV (human immunodeficiency virus infection) (HCC) -ID consult placed - messaged on-call physician via secure-chat, please reach out again in AM if needed  -HIV Quant, CD4 labs  -has not been o nhome meds  Retinitis -Ophthalmology assessed in ED, see consult note from 06/12/21 -call if worse, outpatient follow up otherwise -Valcyclovir IV to preserve R eye   Anemia -trend AM CBC, Hgbon admission 7.7, normocytic -Iron, Ferritin, TIBC ordered -B12, Folate ordered  Hyponatremia -sodium on admission 130, mild hyponatremia -Trend AM labs -IV NS for sepsis and also renal protection w/ abx at pharmacy recommendations   Hypokalemia -mild, K on admission 3.3 -repeat in AM and replete as needed   Hypocalcemia Possible d/t HIV infection -PTH/Ionized Ca ordered -repletion protocol w/ po calcium  -repeat AM labs   Elevated platelet count Most likely inflammatory -trend AM CBC  History of TB (tuberculosis) No evidence for active TB at  this time -pending TB Quantiferon -ID consult   Transaminitis -Trend AM CMP -avoid hepatotoxic medications  -consider US liver if labs not improving   VTE Ppx: Lovenox CODE STATUS   Code Status: Full Code Admitted from: home Expected Dispo: home once medically stable Barriers to discharge: Continue  treatment for community-acquired pneumonia and likely immunocompromised patient, continue treatment for HIV related retinitis.  Infectious disease consult pending.  Patient is medically appropriate for inpatient status Family communication: Offered to call family for patient, he declined this stating that he had already spoken to his sister today              Past Medical History:  Diagnosis Date   HIV (human immunodeficiency virus infection) ([place])     History reviewed. No pertinent surgical history.  No family history on file. Social History:  reports that he has never smoked. He has never used smokeless tobacco. He reports that he does not currently use alcohol. He reports that he does not currently use drugs.  Allergies:  Allergies  Allergen Reactions   Dapsone Other (See Comments)    Other reaction(s): Other (See Comments) G6PD deficiency   Other Other (See Comments)    Seafood - can't speak   Primaquine     Other reaction(s): Other (See Comments) G6PD deficiency    (Not in a hospital admission)   Results for orders placed or performed during the hospital encounter of 06/12/21 (from the past 48 hour(s))  CBC with Differential     Status: Abnormal   Collection Time: 06/12/21 11:30 AM  Result Value Ref Range   WBC 12.6 (H) 4.0 - 10.5 K/uL   RBC 2.97 (L) 4.22 - 5.81 MIL/uL   Hemoglobin 7.7 (L) 13.0 - 17.0 g/dL   HCT 24.6 (L) 39.0 - 52.0 %   MCV 82.8 80.0 - 100.0 fL   MCH 25.9 (L) 26.0 - 34.0 pg   MCHC 31.3 30.0 - 36.0 g/dL   RDW 13.7 11.5 - 15.5 %   Platelets 655 (H) 150 - 400 K/uL   nRBC 0.0 0.0 - 0.2 %   Neutrophils Relative % 86 %   Neutro Abs 10.9 (H) 1.7 - 7.7 K/uL   Lymphocytes Relative 6 %   Lymphs Abs 0.7 0.7 - 4.0 K/uL   Monocytes Relative 5 %   Monocytes Absolute 0.6 0.1 - 1.0 K/uL   Eosinophils Relative 2 %   Eosinophils Absolute 0.3 0.0 - 0.5 K/uL   Basophils Relative 0 %   Basophils Absolute 0.0 0.0 - 0.1 K/uL   Immature Granulocytes 1 %    Abs Immature Granulocytes 0.15 (H) 0.00 - 0.07 K/uL    Comment: Performed at [facility], [address] [place], [place] [address]  T-helper cells (CD4) count (not at [facility])     Status: Abnormal   Collection Time: 06/12/21 11:30 AM  Result Value Ref Range   CD4 T Cell Abs 37 (L) 400 - 1,790 /uL   CD4 % Helper T Cell 5 (L) 33 - 65 %    Comment: Performed at [facility], [place] [address], [place], [place] [address]  Resp Panel by RT-PCR (Flu A&B, Covid) Nasopharyngeal Swab     Status: None   Collection Time: 06/12/21 11:30 AM   Specimen: Nasopharyngeal Swab; Nasopharyngeal(NP) swabs in vial transport medium  Result Value Ref Range   SARS Coronavirus 2 by RT PCR NEGATIVE NEGATIVE    Comment: (NOTE) SARS-CoV-2 target nucleic acids are  NOT DETECTED.  The SARS-CoV-2 RNA is generally detectable in upper respiratory specimens during the acute phase of infection. The lowest concentration of SARS-CoV-2 viral copies this assay can detect is 138 copies/mL. A negative result does not preclude SARS-Cov-2 infection and should not be used as the sole basis for treatment or other patient management decisions. A negative result may occur with  improper specimen collection/handling, submission of specimen other than nasopharyngeal swab, presence of viral mutation(s) within the areas targeted by this assay, and inadequate number of viral copies(<138 copies/mL). A negative result must be combined with clinical observations, patient history, and epidemiological information. The expected result is Negative.  Fact Sheet for Patients:  EntrepreneurPulse.com.au  Fact Sheet for Healthcare Providers:  IncredibleEmployment.be  This test is no t yet approved or cleared by the [place] FDA and  has been authorized for detection and/or diagnosis of SARS-CoV-2 by FDA under an Emergency Use Authorization (EUA). This EUA will remain  in  effect (meaning this test can be used) for the duration of the COVID-19 declaration under Section 564(b)(1) of the Act, 21 U.S.C.section 360bbb-3(b)(1), unless the authorization is terminated  or revoked sooner.       Influenza A by PCR NEGATIVE NEGATIVE   Influenza B by PCR NEGATIVE NEGATIVE    Comment: (NOTE) The Xpert Xpress SARS-CoV-2/FLU/RSV plus assay is intended as an aid in the diagnosis of influenza from Nasopharyngeal swab specimens and should not be used as a sole basis for treatment. Nasal washings and aspirates are unacceptable for Xpert Xpress SARS-CoV-2/FLU/RSV testing.  Fact Sheet for Patients: EntrepreneurPulse.com.au  Fact Sheet for Healthcare Providers: IncredibleEmployment.be  This test is not yet approved or cleared by the [place] FDA and has been authorized for detection and/or diagnosis of SARS-CoV-2 by FDA under an Emergency Use Authorization (EUA). This EUA will remain in effect (meaning this test can be used) for the duration of the COVID-19 declaration under Section 564(b)(1) of the Act, 21 U.S.C. section 360bbb-3(b)(1), unless the authorization is terminated or revoked.  Performed at [facility], [address] [place], [place] [address]   Lactic acid, plasma     Status: None   Collection Time: 06/12/21 11:30 AM  Result Value Ref Range   Lactic Acid, Venous 0.9 0.5 - 1.9 mmol/L    Comment: Performed at [facility], [address] [place], [place] [address]  Comprehensive metabolic panel     Status: Abnormal   Collection Time: 06/12/21 11:30 AM  Result Value Ref Range   Sodium 130 (L) 135 - 145 mmol/L   Potassium 3.3 (L) 3.5 - 5.1 mmol/L   Chloride 93 (L) 98 - 111 mmol/L   CO2 27 22 - 32 mmol/L   Glucose, Bld 111 (H) 70 - 99 mg/dL    Comment: Glucose reference range applies only to samples taken after fasting for at least 8 hours.   BUN 8 6 - 20 mg/dL   Creatinine, Ser 0.72  0.61 - 1.24 mg/dL   Calcium 7.9 (L) 8.9 - 10.3 mg/dL   Total Protein 7.5 6.5 - 8.1 g/dL   Albumin 2.0 (L) 3.5 - 5.0 g/dL   AST 116 (H) 15 - 41 U/L   ALT 125 (H) 0 - 44 U/L   Alkaline Phosphatase 134 (H) 38 - 126 U/L   Total Bilirubin 0.6 0.3 - 1.2 mg/dL   GFR, Estimated >60 >60 mL/min    Comment: (NOTE) Calculated using the CKD-EPI Creatinine Equation (2021)  Anion gap 10 5 - 15    Comment: Performed at [facility], [address] [place], [place] [address]  Protime-INR     Status: Abnormal   Collection Time: 06/12/21 11:30 AM  Result Value Ref Range   Prothrombin Time 15.9 (H) 11.4 - 15.2 seconds   INR 1.3 (H) 0.8 - 1.2    Comment: (NOTE) INR goal varies based on device and disease states. Performed at [facility], [address] [place], [place] [address]   APTT     Status: Abnormal   Collection Time: 06/12/21 11:30 AM  Result Value Ref Range   aPTT 38 (H) 24 - 36 seconds    Comment:        IF BASELINE aPTT IS ELEVATED, SUGGEST PATIENT RISK ASSESSMENT BE USED TO DETERMINE APPROPRIATE ANTICOAGULANT THERAPY. Performed at [facility], [address] [place], [place] [address]   Urinalysis, Routine w reflex microscopic Urine, Clean Catch     Status: Abnormal   Collection Time: 06/12/21  1:45 PM  Result Value Ref Range   Color, Urine AMBER (A) YELLOW    Comment: BIOCHEMICALS MAY BE AFFECTED BY COLOR   APPearance CLEAR CLEAR   Specific Gravity, Urine 1.025 1.005 - 1.030   pH 6.0 5.0 - 8.0   Glucose, UA NEGATIVE NEGATIVE mg/dL   Hgb urine dipstick NEGATIVE NEGATIVE   Bilirubin Urine NEGATIVE NEGATIVE   Ketones, ur NEGATIVE NEGATIVE mg/dL   Protein, ur 30 (A) NEGATIVE mg/dL   Nitrite NEGATIVE NEGATIVE   Leukocytes,Ua NEGATIVE NEGATIVE    Comment: Performed at [facility], [address] [place], [place] [address]  Urinalysis, Microscopic (reflex)     Status: Abnormal   Collection Time: 06/12/21  1:45 PM  Result Value  Ref Range   RBC / HPF 0-5 0 - 5 RBC/hpf   WBC, UA 0-5 0 - 5 WBC/hpf   Bacteria, UA FEW (A) NONE SEEN   Squamous Epithelial / LPF 6-10 0 - 5   Mucus PRESENT    Hyaline Casts, UA PRESENT    Granular Casts, UA PRESENT     Comment: Performed at [facility], [address] [place], [place] [address]   CT Head Wo Contrast  Result Date: 06/12/2021 CLINICAL DATA:  Vision loss.  No reported injury. EXAM: CT HEAD WITHOUT CONTRAST TECHNIQUE: Contiguous axial images were obtained from the base of the skull through the vertex without intravenous contrast. RADIATION DOSE REDUCTION: This exam was performed according to the departmental dose-optimization program which includes automated exposure control, adjustment of the mA and/or kV according to patient size and/or use of iterative reconstruction technique. COMPARISON:  10/05/2015 head CT. FINDINGS: Brain: No evidence of parenchymal hemorrhage or extra-axial fluid collection. No mass lesion, mass effect, or midline shift. No CT evidence of acute infarction. Cerebral volume is age appropriate. No ventriculomegaly. Vascular: No acute abnormality. Skull: No evidence of calvarial fracture. Sinuses/Orbits: The visualized paranasal sinuses are essentially clear. Heterogeneous mild hyperdensity is seen layering in the vitreous of the globes bilaterally, left greater than right (series 2/image 5). Other:  The mastoid air cells are unopacified. IMPRESSION: 1. No evidence of acute intracranial abnormality. 2. Heterogeneous mild hyperdensity layering in the vitreous of the globes bilaterally, left greater than right, cannot exclude vitreous hemorrhage. Electronically Signed   By: Ilona Sorrel M.D.   On: 06/12/2021 11:27   DG Chest Portable 1 View  Result Date:  06/12/2021 CLINICAL DATA:  Cough shortness of breath EXAM: PORTABLE CHEST 1 VIEW COMPARISON:  10/05/2015 FINDINGS: There is moderate to large alveolar infiltrate in the medial right lower lung fields. Right  cardiac margin is preserved. Rest of the lung fields are clear. There is no pleural effusion or pneumothorax. IMPRESSION: There is new large infiltrate in the right lower lung fields suggesting possible pneumonia in the right lower lobe. Electronically Signed   By: Elmer Picker M.D.   On: 06/12/2021 11:21    Review of Systems  Constitutional:  Positive for chills and fatigue.  HENT:  Negative for hearing loss.   Eyes:  Positive for visual disturbance.  Respiratory:  Positive for cough and shortness of breath. Negative for apnea and choking.   Cardiovascular:  Positive for chest pain. Negative for palpitations and leg swelling.  Gastrointestinal:  Negative for abdominal pain, constipation and diarrhea.  Genitourinary:  Negative for difficulty urinating.  Musculoskeletal:  Negative for arthralgias.  Neurological:  Positive for light-headedness. Negative for dizziness, syncope, speech difficulty and weakness.  Psychiatric/Behavioral:  Negative for agitation and confusion.    Blood pressure 107/65, pulse 88, temperature 98.2 F (36.8 C), resp. rate 20, height 6' (1.829 m), weight 74.4 kg, SpO2 96 %. Physical Exam Constitutional:      Appearance: Normal appearance. He is normal weight.  HENT:     Head: Normocephalic and atraumatic.  Eyes:     Extraocular Movements: Extraocular movements intact.     Comments: L eye pupil dilated  Cardiovascular:     Rate and Rhythm: Normal rate and regular rhythm.  Pulmonary:     Effort: Pulmonary effort is normal.     Breath sounds: Rhonchi (RLL) present.  Abdominal:     General: Abdomen is flat.     Palpations: Abdomen is soft.  Musculoskeletal:        General: Normal range of motion.     Cervical back: Normal range of motion and neck supple.  Skin:    General: Skin is warm and dry.  Neurological:     Mental Status: He is alert and oriented to person, place, and time.     Comments: No deficit other than L eye as noted  Psychiatric:         Mood and Affect: Mood normal.        Behavior: Behavior normal.        Thought Content: Thought content normal.        Judgment: Judgment normal.      Emeterio Reeve, DO 06/12/2021, 8:44 PM

## 2021-06-12 NOTE — ED Triage Notes (Signed)
Pt arrives ambulatory to ED with reports of 'left eye going out last week'. Pt reports he can see nothing out of the left eye and is now having some decreased vision in the right eye. Pt also states that he is HIV positive, recently out of prison, was getting all health care in prison but has had not other health care. Pt states he is also 'coughing up black stuff'

## 2021-06-12 NOTE — Assessment & Plan Note (Addendum)
Due to underlying chronic disease/HIV-transfused 1 unit of PRBC on 2/4-hemoglobin stable this morning.  No evidence of bleeding.  Please repeat CBC periodically as an outpatient.

## 2021-06-12 NOTE — Sepsis Progress Note (Signed)
eLink is following this Code Sepsis. °

## 2021-06-12 NOTE — ED Notes (Addendum)
Pt bib [facility] from [facility] for c/c of loss of vision. Pt started to lose vision in L eye last week and now is losing vision in R eye. Per carelink, concern for sepsis. Pt experiencing cough with dark sputum. Hx of TB and HIV.

## 2021-06-12 NOTE — Assessment & Plan Note (Addendum)
Visual loss in left eye-for the past 6 weeks-suspicion for HSV/VZV/CMV retinitis-on acyclovir appreciate ophthalmology input.  Discussed with ID-plan to transition to Valtrex on discharge.  Right eye appears unchanged overnight.  Please ensure follow-up with ophthalmology postdischarge.

## 2021-06-12 NOTE — Assessment & Plan Note (Addendum)
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

## 2021-06-13 ENCOUNTER — Inpatient Hospital Stay (HOSPITAL_COMMUNITY): Payer: Self-pay

## 2021-06-13 ENCOUNTER — Encounter (HOSPITAL_COMMUNITY): Payer: Self-pay | Admitting: Osteopathic Medicine

## 2021-06-13 DIAGNOSIS — H3320 Serous retinal detachment, unspecified eye: Secondary | ICD-10-CM

## 2021-06-13 DIAGNOSIS — E538 Deficiency of other specified B group vitamins: Secondary | ICD-10-CM | POA: Diagnosis present

## 2021-06-13 DIAGNOSIS — J852 Abscess of lung without pneumonia: Secondary | ICD-10-CM

## 2021-06-13 LAB — ACID FAST SMEAR (AFB, MYCOBACTERIA): Acid Fast Smear: NEGATIVE

## 2021-06-13 LAB — CBC
HCT: 20.6 % — ABNORMAL LOW (ref 39.0–52.0)
HCT: 22.1 % — ABNORMAL LOW (ref 39.0–52.0)
Hemoglobin: 6.3 g/dL — CL (ref 13.0–17.0)
Hemoglobin: 6.9 g/dL — CL (ref 13.0–17.0)
MCH: 25.8 pg — ABNORMAL LOW (ref 26.0–34.0)
MCH: 26.3 pg (ref 26.0–34.0)
MCHC: 30.6 g/dL (ref 30.0–36.0)
MCHC: 31.2 g/dL (ref 30.0–36.0)
MCV: 84.4 fL (ref 80.0–100.0)
MCV: 84.4 fL (ref 80.0–100.0)
Platelets: 508 10*3/uL — ABNORMAL HIGH (ref 150–400)
Platelets: 536 10*3/uL — ABNORMAL HIGH (ref 150–400)
RBC: 2.44 MIL/uL — ABNORMAL LOW (ref 4.22–5.81)
RBC: 2.62 MIL/uL — ABNORMAL LOW (ref 4.22–5.81)
RDW: 13.7 % (ref 11.5–15.5)
RDW: 13.6 % (ref 11.5–15.5)
WBC: 10.2 10*3/uL (ref 4.0–10.5)
WBC: 13.8 10*3/uL — ABNORMAL HIGH (ref 4.0–10.5)
nRBC: 0 % (ref 0.0–0.2)
nRBC: 0 % (ref 0.0–0.2)

## 2021-06-13 LAB — BASIC METABOLIC PANEL
Anion gap: 8 (ref 5–15)
BUN: 6 mg/dL (ref 6–20)
CO2: 27 mmol/L (ref 22–32)
Calcium: 7.7 mg/dL — ABNORMAL LOW (ref 8.9–10.3)
Chloride: 100 mmol/L (ref 98–111)
Creatinine, Ser: 0.58 mg/dL — ABNORMAL LOW (ref 0.61–1.24)
GFR, Estimated: >60 mL/min (ref >60)
Glucose, Bld: 114 mg/dL — ABNORMAL HIGH (ref 70–99)
Potassium: 3.5 mmol/L (ref 3.5–5.1)
Sodium: 135 mmol/L (ref 135–145)

## 2021-06-13 LAB — ABO/RH: ABO/RH(D): O POS

## 2021-06-13 LAB — IRON AND TIBC
Iron: 14 ug/dL — ABNORMAL LOW (ref 45–182)
Saturation Ratios: 10 % — ABNORMAL LOW (ref 17.9–39.5)
TIBC: 146 ug/dL — ABNORMAL LOW (ref 250–450)
UIBC: 132 ug/dL

## 2021-06-13 LAB — HEPATITIS PANEL, ACUTE
HCV Ab: NONREACTIVE
Hep A IgM: NONREACTIVE
Hep B C IgM: NONREACTIVE
Hepatitis B Surface Ag: NONREACTIVE

## 2021-06-13 LAB — FERRITIN: Ferritin: 854 ng/mL — ABNORMAL HIGH (ref 24–336)

## 2021-06-13 LAB — PREPARE RBC (CROSSMATCH)

## 2021-06-13 LAB — HEMOGLOBIN AND HEMATOCRIT, BLOOD
HCT: 21.8 % — ABNORMAL LOW (ref 39.0–52.0)
Hemoglobin: 7 g/dL — ABNORMAL LOW (ref 13.0–17.0)

## 2021-06-13 LAB — FOLATE: Folate: 15.6 ng/mL (ref >5.9)

## 2021-06-13 LAB — CREATININE, SERUM
Creatinine, Ser: 0.62 mg/dL (ref 0.61–1.24)
GFR, Estimated: >60 mL/min (ref >60)

## 2021-06-13 LAB — VITAMIN B12: Vitamin B-12: 333 pg/mL (ref 180–914)

## 2021-06-13 LAB — HIV-1 RNA QUANT-NO REFLEX-BLD
HIV 1 RNA Quant: 315000 copies/mL
LOG10 HIV-1 RNA: 5.498 log10copy/mL

## 2021-06-13 MED ORDER — SODIUM CHLORIDE 0.9 % IV SOLN
2.0000 g | INTRAVENOUS | Status: DC
  Administered 2021-06-13 – 2021-06-14 (×2): 2 g via INTRAVENOUS
  Filled 2021-06-13 (×2): qty 20

## 2021-06-13 MED ORDER — SODIUM CHLORIDE 0.9 % IV SOLN: INTRAVENOUS | Status: AC

## 2021-06-13 MED ORDER — SODIUM CHLORIDE 0.9% IV SOLUTION: Freq: Once | INTRAVENOUS | Status: AC

## 2021-06-13 MED ORDER — SODIUM CHLORIDE 0.9 % IV SOLN
5.0000 mg/kg | Freq: Two times a day (BID) | INTRAVENOUS | Status: DC
  Filled 2021-06-13: qty 7.4

## 2021-06-13 MED ORDER — IOHEXOL 300 MG/ML  SOLN
75.0000 mL | Freq: Once | INTRAMUSCULAR | Status: AC | PRN
  Administered 2021-06-13: 75 mL via INTRAVENOUS

## 2021-06-13 MED ORDER — DEXTROSE 5 % IV SOLN
10.0000 mg/kg | Freq: Three times a day (TID) | INTRAVENOUS | Status: DC
  Filled 2021-06-13 (×2): qty 14.7

## 2021-06-13 MED ORDER — SODIUM CHLORIDE 0.9 % IV SOLN: INTRAVENOUS | Status: DC

## 2021-06-13 MED ORDER — SODIUM CHLORIDE 0.9 % IV SOLN
5.0000 mg/kg | Freq: Two times a day (BID) | INTRAVENOUS | Status: DC
  Administered 2021-06-13: 370 mg via INTRAVENOUS
  Filled 2021-06-13 (×2): qty 7.4

## 2021-06-13 MED ORDER — METRONIDAZOLE 500 MG PO TABS
500.0000 mg | ORAL_TABLET | Freq: Two times a day (BID) | ORAL | Status: DC
  Administered 2021-06-13 – 2021-06-14 (×4): 500 mg via ORAL
  Filled 2021-06-13 (×4): qty 1

## 2021-06-13 MED ORDER — IBUPROFEN 400 MG PO TABS
400.0000 mg | ORAL_TABLET | Freq: Once | ORAL | Status: AC
  Administered 2021-06-13: 400 mg via ORAL
  Filled 2021-06-13: qty 1

## 2021-06-13 MED ORDER — CYANOCOBALAMIN 1000 MCG/ML IJ SOLN
1000.0000 ug | Freq: Every day | INTRAMUSCULAR | Status: DC
  Administered 2021-06-13 – 2021-06-17 (×5): 1000 ug via SUBCUTANEOUS
  Filled 2021-06-13 (×5): qty 1

## 2021-06-13 MED ORDER — ENSURE ENLIVE PO LIQD
237.0000 mL | Freq: Two times a day (BID) | ORAL | Status: DC
  Administered 2021-06-13 – 2021-06-17 (×7): 237 mL via ORAL

## 2021-06-13 NOTE — Progress Notes (Signed)
Lab called with critical value Hgb of 6.3.  Notified MD.

## 2021-06-13 NOTE — Assessment & Plan Note (Addendum)
Hypoxia seems to have resolved-on room air.

## 2021-06-13 NOTE — Hospital Course (Addendum)
Patient is a 51 y.o.  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  Significant Hospital events: 2/3>> admit to [facility] PNA/left eye visual loss.  Significant imaging studies: 2/3>> CT head: No acute intracranial abnormality 2/3>> CXR: Large right lower lobe PNA 2/4>> CT chest: 7.2 cm lung abscess in right lower lobe.  Significant microbiology data: 2/3>> COVID/flu PCR: Negative 2/3>> blood culture: No growth 2/3>> urine culture: Pending 2/3>> sputum AFB smear: Negative 2/3>> sputum culture: Normal respiratory flora 2/6>> sputum AFB smear: Negative 2/6>>CMV DNR>>pending 2/7>> sputum AFB smear: negative  Procedures: None  Consults:  ID Ophthalmology

## 2021-06-13 NOTE — Assessment & Plan Note (Addendum)
Borderline vitamin B12 levels-continue supplementation.

## 2021-06-13 NOTE — Progress Notes (Signed)
Pharmacy Antibiotic Note  Mason Moran is a 51 y.o. male admitted on 06/12/2021 with pneumonia. 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 Pharmacy has been consulted for vancomycin, cefepime, and ganciclovir dosing.   2/3 CD4 count 37. Tmax 100.9. WBC 13.8.  Plan: Start acyclovir 735 mg (10 mg/kg) IV Q 8 hours. Will time first dose when next dose of ganciclovir was scheduled.  Started continuous IV fluids per protocol NS @125mL /hr.  Discontinue ganciclovir per pharmacy  F/u renal fxn, CX results, clinical status and levels as needed F/u ability to transition to oral valacyclovir  Height: 6' (182.9 cm) Weight: 73.5 kg (161 lb 15.6 oz) IBW/kg (Calculated) : 77.6  Temp (24hrs), Avg:99.8 F (37.7 C), Min:98.2 F (36.8 C), Max:100.9 F (38.3 C)  Recent Labs  Lab 06/12/21 1130 06/12/21 2000 06/13/21 0143  WBC 12.6* 10.2 13.8*  CREATININE 0.72 0.62 0.58*  LATICACIDVEN 0.9  --   --      Estimated Creatinine Clearance: 113.6 mL/min (A) (by C-G formula based on SCr of 0.58 mg/dL (L)).    Allergies  Allergen Reactions   Dapsone Other (See Comments)    Other reaction(s): Other (See Comments) G6PD deficiency   Other Other (See Comments)    Seafood - can't speak   Primaquine     Other reaction(s): Other (See Comments) G6PD deficiency    Antimicrobials this admission: Vanc 2/3>> Cefepime 2/3>> Ganciclovir 2/4>>  Dose adjustments this admission: N/A  Microbiology results: 2/3 Resp Cx: gram + cocci, gram - rods, gram + rods 2/3 Sputum Cx: pending  2/3 Bcx: pending 2/3 Ucx: pending   Thank you for allowing pharmacy to be a part of this patients care.  08/11/21 06/13/2021 1:55 PM

## 2021-06-13 NOTE — Progress Notes (Signed)
Blood bank called to notify of [facility] unit ready to pick up.

## 2021-06-13 NOTE — Progress Notes (Signed)
PROGRESS NOTE        PATIENT DETAILS Name: Mason Moran Age: 51 y.o. Sex: male Date of Birth: 1970/07/15 Admit Date: 06/12/2021 Admitting Physician Emeterio Reeve, DO PCP:Pcp, No  Brief Summary: Patient is a 51 y.o.  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  Significant Hospital events: 2/3>> admit to [facility] PNA/left eye visual loss.  Significant imaging studies: 2/3>> CT head: No acute intracranial abnormality 2/3>> CXR: Large right lower lobe PNA  Significant microbiology data: 2/3>> COVID/flu PCR: Negative 2/3>> blood culture: Pending 2/3>> urine culture: Pending 2/3>> sputum AFB culture/smear: Pending 2/3>> sputum culture: Pending  Procedures: None  Consults:  ID Ophthalmology  Subjective: Lying comfortably in bed-denies any chest pain or shortness of breath.  Objective: Vitals: Blood pressure 107/70, pulse 80, temperature (!) 100.8 F (38.2 C), temperature source Oral, resp. rate (!) 22, height 6' (1.829 m), weight 73.5 kg, SpO2 100 %.   Exam: Gen Exam:Alert awake-not in any distress HEENT:atraumatic, normocephalic Chest: B/L clear to auscultation anteriorly CVS:S1S2 regular Abdomen:soft non tender, non distended Extremities:no edema Neurology: Non focal Skin: no rash  Pertinent Labs/Radiology: CBC Latest Ref Rng & Units 06/13/2021 06/13/2021 06/12/2021  WBC 4.0 - 10.5 K/uL - 13.8(H) 10.2  Hemoglobin 13.0 - 17.0 g/dL 7.0(L) 6.9(LL) 6.3(LL)  Hematocrit 39.0 - 52.0 % 21.8(L) 22.1(L) 20.6(L)  Platelets 150 - 400 K/uL - 536(H) 508(H)    Lab Results  Component Value Date   NA 135 06/13/2021   K 3.5 06/13/2021   CL 100 06/13/2021   CO2 27 06/13/2021      Assessment/Plan: * Sepsis due to RLL PNA- (present on admission) Sepsis physiology has resolved-multiple differentials  for PNA-CMV, recurrent Mycobacterium kansasii infection-and usual bacterial organisms.  Unlikely to be PJP given RLL consolidation on CXR await sputum/AFB cultures.  Ordered CT chest to better delineate PNA.  For now continue with Vanco/cefepime/and after discussion with ID-have switched from acyclovir to ganciclovir  Acute respiratory failure with hypoxia ([place])- (present on admission) Appears to have mild hypoxia due to PNA-titrate off oxygen as tolerated.  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  Appreciate ophthalmology input.  Right eye appears unchanged overnight.  Anemia- (present on admission) Due to underlying chronic disease/HIV-transfused 1 unit of PRBC earlier this morning-follow posttransfusion CBC.  No evidence of bleeding.  HIV  (CD4 count of 37 on 2/3)- (present on admission) Recently incarcerated-off antiretrovirals for several years.  Await ID evaluation-defer PJP prophylaxis/MAI prophylaxis medications to infectious disease.  Elevated platelet count Likely reactive thrombocytosis in the setting of HIV/AIDS.  Transaminitis Unclear etiology-could be related to underlying infectious issue-?  CMV.  Check hepatitis serology and RUQ ultrasound.  History of TB (tuberculosis) History of Mycobacterium kansasii in 2018-Per chart review and Care Everywhere-this was apparently treated.  Presenting with large RLL PNA-awaiting CT chest and sputum cultures/AFB smear.  Maintain airborne isolation for now.  Hyponatremia Mild hyponatremia on admission likely due to hypovolemia-responded to IVF.  Follow electrolytes closely.  Hypokalemia Repleted.  Hypocalcemia Mild borderline hypercalcemia due to hypoalbuminemia-follow closely.  BMI: Estimated body mass index is 21.97 kg/m as calculated from the following:   Height as  of this  encounter: 6' (1.829 m).   Weight as of this encounter: 73.5 kg.   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    Planned Discharge Destination:Home   Diet: Diet Order             Diet regular Room service appropriate? Yes; Fluid consistency: Thin  Diet effective now                     Antimicrobial agents: Anti-infectives (From admission, onward)    Start     Dose/Rate Route Frequency Ordered Stop   06/13/21 0930  ganciclovir (CYTOVENE) 370 mg in sodium chloride 0.9 % 100 mL IVPB        5 mg/kg  73.5 kg 100 mL/hr over 60 Minutes Intravenous Every 12 hours 06/13/21 0830     06/12/21 2200  vancomycin (VANCOREADY) IVPB 1250 mg/250 mL        1,250 mg 166.7 mL/hr over 90 Minutes Intravenous Every 12 hours 06/12/21 1254     06/12/21 2100  acyclovir (ZOVIRAX) 745 mg in dextrose 5 % 150 mL IVPB  Status:  Discontinued        10 mg/kg  74.4 kg 164.9 mL/hr over 60 Minutes Intravenous Every 8 hours 06/12/21 2003 06/13/21 0809   06/12/21 2030  acyclovir (ZOVIRAX) tablet 800 mg  Status:  Discontinued        800 mg Oral 3 times daily 06/12/21 1946 06/12/21 1952   06/12/21 2000  ceFEPIme (MAXIPIME) 2 g in sodium chloride 0.9 % 100 mL IVPB        2 g 200 mL/hr over 30 Minutes Intravenous Every 8 hours 06/12/21 1254     06/12/21 1100  vancomycin (VANCOCIN) IVPB 1000 mg/200 mL premix        1,000 mg 200 mL/hr over 60 Minutes Intravenous  Once 06/12/21 1046 06/12/21 1347   06/12/21 1100  ceFEPIme (MAXIPIME) 2 g in sodium chloride 0.9 % 100 mL IVPB        2 g 200 mL/hr over 30 Minutes Intravenous  Once 06/12/21 1046 06/12/21 1347         MEDICATIONS: Scheduled Meds:  calcium carbonate  1,000 mg of elemental calcium Oral Q breakfast   enoxaparin (LOVENOX) injection  40 mg Subcutaneous Q24H   feeding supplement  237 mL Oral BID BM   Continuous Infusions:  sodium chloride Stopped (06/12/21 1626)   sodium chloride     ceFEPime (MAXIPIME) IV 2 g (06/13/21 0706)   ganciclovir     vancomycin 1,250 mg (06/13/21 0902)   PRN Meds:.sodium chloride   I have personally reviewed following labs and imaging studies  LABORATORY DATA: CBC: Recent Labs  Lab 06/12/21 1130 06/12/21 2000 06/13/21 0143 06/13/21 0931  WBC 12.6* 10.2 13.8*  --   NEUTROABS 10.9*  --   --   --   HGB 7.7* 6.3* 6.9* 7.0*  HCT 24.6* 20.6* 22.1* 21.8*  MCV 82.8 84.4 84.4  --   PLT 655* 508* 536*  --     Basic Metabolic Panel: Recent Labs  Lab 06/12/21 1130 06/12/21 2000 06/13/21 0143  NA 130*  --  135  K 3.3*  --  3.5  CL 93*  --  100  CO2 27  --  27  GLUCOSE 111*  --  114*  BUN 8  --  6  CREATININE 0.72 0.62 0.58*  CALCIUM 7.9*  --  7.7*    GFR: Estimated Creatinine Clearance: 113.6 mL/min (A) (by C-G formula based on SCr of 0.58 mg/dL (L)).  Liver Function Tests: Recent Labs  Lab 06/12/21 1130  AST 116*  ALT 125*  ALKPHOS 134*  BILITOT 0.6  PROT 7.5  ALBUMIN 2.0*   No results for input(s): LIPASE, AMYLASE in the last 168 hours. No results for input(s): AMMONIA in the last 168 hours.  Coagulation Profile: Recent Labs  Lab 06/12/21 1130  INR 1.3*    Cardiac Enzymes: No results for input(s): CKTOTAL, CKMB, CKMBINDEX, TROPONINI in the last 168 hours.  BNP (last 3 results) No results for input(s): PROBNP in the last 8760 hours.  Lipid Profile: No results for input(s): CHOL, HDL, LDLCALC, TRIG, CHOLHDL, LDLDIRECT in the last 72 hours.  Thyroid Function Tests: No results for input(s): TSH, T4TOTAL, FREET4, T3FREE, THYROIDAB in the last 72 hours.  Anemia Panel: Recent Labs    06/13/21 0143  VITAMINB12 333   FOLATE 15.6  FERRITIN 854*  TIBC 146*  IRON 14*    Urine analysis:    Component Value Date/Time   COLORURINE AMBER (A) 06/12/2021 1345   APPEARANCEUR CLEAR 06/12/2021 1345   LABSPEC 1.025 06/12/2021 1345   PHURINE 6.0 06/12/2021 1345   GLUCOSEU NEGATIVE 06/12/2021 1345   HGBUR NEGATIVE 06/12/2021 1345   BILIRUBINUR NEGATIVE 06/12/2021 1345   KETONESUR NEGATIVE 06/12/2021 1345   PROTEINUR 30 (A) 06/12/2021 1345   NITRITE NEGATIVE 06/12/2021 1345   LEUKOCYTESUR NEGATIVE 06/12/2021 1345    Sepsis Labs: Lactic Acid, Venous    Component Value Date/Time   LATICACIDVEN 0.9 06/12/2021 1130    MICROBIOLOGY: Recent Results (from the past 240 hour(s))  Expectorated Sputum Assessment w Gram Stain, Rflx to Resp Cult     Status: None   Collection Time: 06/12/21 11:04 AM   Specimen: Sputum  Result Value Ref Range Status   Specimen Description   Final    SPUTUM Performed at [facility], [address] [place], [place] [address]    Special Requests   Final    Immunocompromised Performed at [facility], [address] [place], [place] [address]    Sputum evaluation   Final    THIS SPECIMEN IS ACCEPTABLE FOR SPUTUM CULTURE Performed at [facility], [address] [place], [place] [address]    Report Status 06/12/2021 FINAL  Final  Culture, Respiratory w Gram Stain     Status: None (Preliminary result)   Collection Time: 06/12/21 11:04 AM   Specimen: SPU  Result Value Ref Range Status   Specimen Description SPUTUM  Final   Special Requests Immunocompromised Reflexed from [id number]  Final   Gram Stain   Final    ABUNDANT WBC PRESENT,BOTH PMN AND MONONUCLEAR ABUNDANT GRAM POSITIVE COCCI FEW GRAM NEGATIVE RODS FEW GRAM POSITIVE RODS    Culture   Final    TOO YOUNG TO READ Performed at [facility], [address] [place], [place] [address]    Report Status PENDING  Incomplete  Resp Panel by RT-PCR (Flu A&B, Covid) Nasopharyngeal Swab      Status: None   Collection Time: 06/12/21 11:30 AM   Specimen: Nasopharyngeal Swab; Nasopharyngeal(NP) swabs in vial transport medium  Result Value Ref Range Status  SARS Coronavirus 2 by RT PCR NEGATIVE NEGATIVE Final    Comment: (NOTE) SARS-CoV-2 target nucleic acids are NOT DETECTED.  The SARS-CoV-2 RNA is generally detectable in upper respiratory specimens during the acute phase of infection. The lowest concentration of SARS-CoV-2 viral copies this assay can detect is 138 copies/mL. A negative result does not preclude SARS-Cov-2 infection and should not be used as the sole basis for treatment or other patient management decisions. A negative result may occur with  improper specimen collection/handling, submission of specimen other than nasopharyngeal swab, presence of viral mutation(s) within the areas targeted by this assay, and inadequate number of viral copies(<138 copies/mL). A negative result must be combined with clinical observations, patient history, and epidemiological information. The expected result is Negative.  Fact Sheet for Patients:  EntrepreneurPulse.com.au  Fact Sheet for Healthcare Providers:  IncredibleEmployment.be  This test is no t yet approved or cleared by the [place] FDA and  has been authorized for detection and/or diagnosis of SARS-CoV-2 by FDA under an Emergency Use Authorization (EUA). This EUA will remain  in effect (meaning this test can be used) for the duration of the COVID-19 declaration under Section 564(b)(1) of the Act, 21 U.S.C.section 360bbb-3(b)(1), unless the authorization is terminated  or revoked sooner.       Influenza A by PCR NEGATIVE NEGATIVE Final   Influenza B by PCR NEGATIVE NEGATIVE Final    Comment: (NOTE) The Xpert Xpress SARS-CoV-2/FLU/RSV plus assay is intended as an aid in the diagnosis of influenza from Nasopharyngeal swab specimens and should not be used as a sole basis  for treatment. Nasal washings and aspirates are unacceptable for Xpert Xpress SARS-CoV-2/FLU/RSV testing.  Fact Sheet for Patients: EntrepreneurPulse.com.au  Fact Sheet for Healthcare Providers: IncredibleEmployment.be  This test is not yet approved or cleared by the [place] FDA and has been authorized for detection and/or diagnosis of SARS-CoV-2 by FDA under an Emergency Use Authorization (EUA). This EUA will remain in effect (meaning this test can be used) for the duration of the COVID-19 declaration under Section 564(b)(1) of the Act, 21 U.S.C. section 360bbb-3(b)(1), unless the authorization is terminated or revoked.  Performed at [facility], [address] [place], [place] [address]   Blood Culture (routine x 2)     Status: None (Preliminary result)   Collection Time: 06/12/21 11:30 AM   Specimen: BLOOD  Result Value Ref Range Status   Specimen Description   Final    BLOOD Blood Culture adequate volume Performed at [facility], [address] [place], [place] [address]    Special Requests   Final    BOTTLES DRAWN AEROBIC AND ANAEROBIC RIGHT ANTECUBITAL Performed at [facility], [address] [place], [place] [address]    Culture   Final    NO GROWTH < 24 HOURS Performed at [facility], [place] [address] [place], [place] [address]    Report Status PENDING  Incomplete  Blood Culture (routine x 2)     Status: None (Preliminary result)   Collection Time: 06/12/21 11:50 AM   Specimen: BLOOD  Result Value Ref Range Status   Specimen Description   Final    BLOOD Blood Culture adequate volume Performed at [facility], [address] [place], [place] [address]    Special Requests   Final    BOTTLES DRAWN AEROBIC AND ANAEROBIC BLOOD RIGHT FOREARM Performed at [facility], [address] [place], [place] [address]    Culture   Final    NO GROWTH < 24  HOURS Performed at [facility], [place] [address] [place], [place] [address]    Report Status PENDING  Incomplete    RADIOLOGY STUDIES/RESULTS: CT Head Wo Contrast  Result Date: 06/12/2021 CLINICAL DATA:  Vision loss.  No reported injury. EXAM: CT HEAD WITHOUT CONTRAST TECHNIQUE: Contiguous axial images were obtained from the base of the skull through the vertex without intravenous contrast. RADIATION DOSE REDUCTION: This exam was performed according to the departmental dose-optimization program which includes automated exposure control, adjustment of the mA and/or kV according to patient size and/or use of iterative reconstruction technique. COMPARISON:  10/05/2015 head CT. FINDINGS: Brain: No evidence of parenchymal hemorrhage or extra-axial fluid collection. No mass lesion, mass effect, or midline shift. No CT evidence of acute infarction. Cerebral volume is age appropriate. No ventriculomegaly. Vascular: No acute abnormality. Skull: No evidence of calvarial fracture. Sinuses/Orbits: The visualized paranasal sinuses are essentially clear. Heterogeneous mild hyperdensity is seen layering in the vitreous of the globes bilaterally, left greater than right (series 2/image 5). Other:  The mastoid air cells are unopacified. IMPRESSION: 1. No evidence of acute intracranial abnormality. 2. Heterogeneous mild hyperdensity layering in the vitreous of the globes bilaterally, left greater than right, cannot exclude vitreous hemorrhage. Electronically Signed   By: Ilona Sorrel M.D.   On: 06/12/2021 11:27   DG Chest Portable 1 View  Result Date: 06/12/2021 CLINICAL DATA:  Cough shortness of breath EXAM: PORTABLE CHEST 1 VIEW COMPARISON:  10/05/2015 FINDINGS: There is moderate to large alveolar infiltrate in the medial right lower lung fields. Right cardiac margin is preserved. Rest of the lung fields are clear. There is no pleural effusion or pneumothorax. IMPRESSION: There is new large infiltrate in the right  lower lung fields suggesting possible pneumonia in the right lower lobe. Electronically Signed   By: Elmer Picker M.D.   On: 06/12/2021 11:21     LOS: 1 day   Oren Binet, MD  [facility]    To contact the attending provider between 7A-7P or the covering provider during after hours 7P-7A, please log into the web site [email or web address] and access using universal [place] password for that web site. If you do not have the password, please call the hospital operator.  06/13/2021, 10:47 AM

## 2021-06-13 NOTE — Plan of Care (Signed)

## 2021-06-13 NOTE — Progress Notes (Signed)
Blood bank called to notify they filed a 2nd lab draw on pt before they can finalize his type and screen. Called phlebotomy to make sure they knew about the new lab.  Will be up soon to draw patient.

## 2021-06-13 NOTE — Progress Notes (Signed)
Lab called with critical Hgb of 6.9; this was draw from type and screen.  Notified MD

## 2021-06-13 NOTE — Consult Note (Signed)
[facility]       Reason for Consult: HIV/AIDS, retinitis, cavitary pneumonia    Referring Physician: Dr. 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   Anemia   Hyponatremia   Hypokalemia   Hypocalcemia   Elevated platelet count   History of TB (tuberculosis)   Transaminitis   Acute respiratory failure with hypoxia (HCC)   Vitamin B12 deficiency    calcium carbonate  1,000 mg of elemental calcium Oral Q breakfast   cyanocobalamin  1,000 mcg Subcutaneous Daily   enoxaparin (LOVENOX) injection  40 mg Subcutaneous Q24H   feeding supplement  237 mL Oral BID BM    Recommendations: Acyclovir Will hold on ARVs Will add metronidazole Change cefepime to ceftriaxone Will stop vancomycin   Assessment: He is blind in his left eye with retinal detachment.  Differential of the retinitis and chronic retinal detachment in him with a CD4 count of just 37 includes VZV/HSV-type such as progressive outer retinal necrosis vs CMV retinitis.  The severity of the necrosis is more c/w VZV/HSV so will use acyclovir, though no way to know definitively.   This is mainly for prevention of progression in his right eye in hopes of avoiding further vision loss.  He will not need a prolonged course of IV acyclovir but will use IV for now and transition to oral valacyclovir.   Case discussed with Dr. Dione BoozeGroat.    He appears to have a lung abscess based on CT findings.  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  Appearance of the CT less like a Mycobacterial infection and more c/w a typical pneumonia/abscess such as anaerobic or MRSA.  He does report a putrid odor as well. Sputum culture with abundant GPC at this time.  He will need 3 AFB smears to rule out Tb, as is being done.    For his HIV/AIDS, he clearly has been off his medications and the only resistance concern in the past I can see is to efavirenz.  He had been on Tivicay and Truvada and likely will be ok with Biktarvy or Symtuza.  At this time though with his underlying issues, I will hold on starting ARVs to avoid early IRIS-like symptoms of his eyes.    Antibiotics: Vancomycin, cefepime  HPI: Mason Moran is a 51 y.o. male with a history of HIV over 17 years previously well-controlled here with multiple issues as above related to his poor immune system with a CD4 of 37.  He tells me he has been off his ARVs about 2 years and understands the implications of his poor immune system.  He presented mainly with blindness of his right eye and was seen by ophthalmology and noted his right eye c/w retinitis and chronic retinal detachment concerning for viral etiology.  He also has a large infiltrate of the posterior right lower lobe and multiple loculated fluid collections with air-fluid levels within the infiltrate concerning for abscess.  He has a history of M kansasii infection in 2018 and treated with isoniazid/B6, ethambutol and rifampin with a RUL process and hilar adenopathy. This occurred while he was in jail.    Review of Systems:  Constitutional: positive for fevers, chills, and weight loss or negative for sweats  and anorexia Respiratory: positive for cough or sputum, negative for hemoptysis Gastrointestinal: negative for nausea and diarrhea Integument/breast: negative for dryness All other systems reviewed and are negative    Past Medical History:  Diagnosis Date   HIV (human immunodeficiency virus infection) (HCC)     Social History   Tobacco Use   Smoking status: Never   Smokeless tobacco: Never  Vaping Use   Vaping Use: Never used  Substance Use Topics   Alcohol use: Not Currently    Comment: none in a month   Drug use: Not Currently    Comment: none in the past month    FMH: + cardiac  disease  Allergies  Allergen Reactions   Dapsone Other (See Comments)    Other reaction(s): Other (See Comments) G6PD deficiency   Other Other (See Comments)    Seafood - can't speak   Primaquine     Other reaction(s): Other (See Comments) G6PD deficiency    Physical Exam: Constitutional: in no apparent distress  Vitals:   06/13/21 0800 06/13/21 1200  BP: 115/77   Pulse: 85   Resp: 12   Temp:  99.8 F (37.7 C)  SpO2: 98%    EYES: anicteric ENMT: no thrush Cardiovascular: Cor RRR Respiratory: diminished at the bases; GI: soft Musculoskeletal: no edema Skin: diffuse scaly, dry skin Neuro: non-focal  Lab Results  Component Value Date   WBC 13.8 (H) 06/13/2021   HGB 7.0 (L) 06/13/2021   HCT 21.8 (L) 06/13/2021   MCV 84.4 06/13/2021   PLT 536 (H) 06/13/2021    Lab Results  Component Value Date   CREATININE 0.58 (L) 06/13/2021   BUN 6 06/13/2021   NA 135 06/13/2021   K 3.5 06/13/2021   CL 100 06/13/2021   CO2 27 06/13/2021    Lab Results  Component Value Date   ALT 125 (H) 06/12/2021   AST 116 (H) 06/12/2021   ALKPHOS 134 (H) 06/12/2021     Microbiology: Recent Results (from the past 240 hour(s))  Expectorated Sputum Assessment w Gram Stain, Rflx to Resp Cult     Status: None   Collection Time: 06/12/21 11:04 AM   Specimen: Sputum  Result Value Ref Range Status   Specimen Description   Final    SPUTUM Performed at [facility], [address] [place], [place] [address]    Special Requests   Final    Immunocompromised Performed at [facility], [address] [place], [place] [address]    Sputum evaluation   Final    THIS SPECIMEN IS ACCEPTABLE FOR SPUTUM CULTURE Performed at [facility], [address], [place], [place] [address]    Report Status 06/12/2021 FINAL  Final  Culture, Respiratory w Gram Stain     Status: None (Preliminary result)   Collection Time: 06/12/21 11:04 AM   Specimen: SPU  Result Value Ref  Range Status   Specimen Description SPUTUM  Final   Special Requests Immunocompromised Reflexed from [id number]  Final   Gram Stain   Final    ABUNDANT WBC PRESENT,BOTH PMN AND MONONUCLEAR ABUNDANT GRAM POSITIVE COCCI FEW GRAM NEGATIVE RODS FEW GRAM POSITIVE RODS    Culture   Final    TOO YOUNG TO READ Performed at [facility], [address] [place], [place] [address]    Report Status PENDING  Incomplete  Resp Panel by RT-PCR (Flu A&B, Covid) Nasopharyngeal Swab     Status: None  Collection Time: 06/12/21 11:30 AM   Specimen: Nasopharyngeal Swab; Nasopharyngeal(NP) swabs in vial transport medium  Result Value Ref Range Status   SARS Coronavirus 2 by RT PCR NEGATIVE NEGATIVE Final    Comment: (NOTE) SARS-CoV-2 target nucleic acids are NOT DETECTED.  The SARS-CoV-2 RNA is generally detectable in upper respiratory specimens during the acute phase of infection. The lowest concentration of SARS-CoV-2 viral copies this assay can detect is 138 copies/mL. A negative result does not preclude SARS-Cov-2 infection and should not be used as the sole basis for treatment or other patient management decisions. A negative result may occur with  improper specimen collection/handling, submission of specimen other than nasopharyngeal swab, presence of viral mutation(s) within the areas targeted by this assay, and inadequate number of viral copies(<138 copies/mL). A negative result must be combined with clinical observations, patient history, and epidemiological information. The expected result is Negative.  Fact Sheet for Patients:  BloggerCourse.comhttps://www.fda.gov/media/152166/download  Fact Sheet for Healthcare Providers:  SeriousBroker.ithttps://www.fda.gov/media/152162/download  This test is no t yet approved or cleared by the [place] FDA and  has been authorized for detection and/or diagnosis of SARS-CoV-2 by FDA under an Emergency Use Authorization (EUA). This EUA will remain  in effect (meaning this  test can be used) for the duration of the COVID-19 declaration under Section 564(b)(1) of the Act, 21 U.S.C.section 360bbb-3(b)(1), unless the authorization is terminated  or revoked sooner.       Influenza A by PCR NEGATIVE NEGATIVE Final   Influenza B by PCR NEGATIVE NEGATIVE Final    Comment: (NOTE) The Xpert Xpress SARS-CoV-2/FLU/RSV plus assay is intended as an aid in the diagnosis of influenza from Nasopharyngeal swab specimens and should not be used as a sole basis for treatment. Nasal washings and aspirates are unacceptable for Xpert Xpress SARS-CoV-2/FLU/RSV testing.  Fact Sheet for Patients: BloggerCourse.comhttps://www.fda.gov/media/152166/download  Fact Sheet for Healthcare Providers: SeriousBroker.ithttps://www.fda.gov/media/152162/download  This test is not yet approved or cleared by the [place] FDA and has been authorized for detection and/or diagnosis of SARS-CoV-2 by FDA under an Emergency Use Authorization (EUA). This EUA will remain in effect (meaning this test can be used) for the duration of the COVID-19 declaration under Section 564(b)(1) of the Act, 21 U.S.C. section 360bbb-3(b)(1), unless the authorization is terminated or revoked.  Performed at [facility], [address] [place], [place] [address]   Blood Culture (routine x 2)     Status: None (Preliminary result)   Collection Time: 06/12/21 11:30 AM   Specimen: BLOOD  Result Value Ref Range Status   Specimen Description   Final    BLOOD Blood Culture adequate volume Performed at [facility], [address] [place], [place] [address]    Special Requests   Final    BOTTLES DRAWN AEROBIC AND ANAEROBIC RIGHT ANTECUBITAL Performed at [facility] [place], [address] [place], [place] [address]    Culture   Final    NO GROWTH < 24 HOURS Performed at [facility], [address] [place], [place] [address]    Report Status PENDING  Incomplete  Blood Culture (routine x 2)     Status:  None (Preliminary result)   Collection Time: 06/12/21 11:50 AM   Specimen: BLOOD  Result Value Ref Range Status   Specimen Description   Final    BLOOD Blood Culture adequate volume Performed at [facility] [place], [address] [place], [place] [address]  Special Requests   Final    BOTTLES DRAWN AEROBIC AND ANAEROBIC BLOOD RIGHT FOREARM Performed at [facility], [address] [place], [place] [address]    Culture   Final    NO GROWTH < 24 HOURS Performed at [facility], [address], [place], [place] [address]    Report Status PENDING  Incomplete    Gardiner Barefoot, MD [facility] [email or web address] 06/13/2021, 1:31 PM

## 2021-06-13 NOTE — Progress Notes (Signed)
Pharmacy Antibiotic Note  Mason Moran is a 51 y.o. male admitted on 06/12/2021 with pneumonia. 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 Pharmacy has been consulted for vancomycin, cefepime, and ganciclovir dosing.   2/3 CD4 count 37. Tmax 100.9. WBC 13.8.  Plan: Start Ganciclovir 370mg  q12h for 14-21 days and follow with secondary ppx dosing as needed  Discontinue acyclovir per pharmacy  Vancomycin 1250mg  IV Q12H Cefepime 2gm IV Q8H F/u renal fxn, CX results, clinical status and levels as needed  Height: 6' (182.9 cm) Weight: 73.5 kg (161 lb 15.6 oz) IBW/kg (Calculated) : 77.6  Temp (24hrs), Avg:99.6 F (37.6 C), Min:98.2 F (36.8 C), Max:100.9 F (38.3 C)  Recent Labs  Lab 06/12/21 1130 06/12/21 2000 06/13/21 0143  WBC 12.6* 10.2 13.8*  CREATININE 0.72 0.62 0.58*  LATICACIDVEN 0.9  --   --      Estimated Creatinine Clearance: 113.6 mL/min (A) (by C-G formula based on SCr of 0.58 mg/dL (L)).    Allergies  Allergen Reactions   Dapsone Other (See Comments)    Other reaction(s): Other (See Comments) G6PD deficiency   Other Other (See Comments)    Seafood - can't speak   Primaquine     Other reaction(s): Other (See Comments) G6PD deficiency    Antimicrobials this admission: Vanc 2/3>> Cefepime 2/3>> Ganciclovir 2/4>>  Dose adjustments this admission: N/A  Microbiology results: 2/3 Resp Cx: gram + cocci, gram - rods, gram + rods 2/3 Sputum Cx: pending  2/3 Bcx: pending 2/3 Ucx: pending   Thank you for allowing pharmacy to be a part of this patients care.  08/10/21 06/13/2021 8:13 AM

## 2021-06-14 ENCOUNTER — Inpatient Hospital Stay (HOSPITAL_COMMUNITY): Payer: Self-pay

## 2021-06-14 DIAGNOSIS — R0603 Acute respiratory distress: Secondary | ICD-10-CM

## 2021-06-14 LAB — COMPREHENSIVE METABOLIC PANEL
ALT: 63 U/L — ABNORMAL HIGH (ref 0–44)
AST: 40 U/L (ref 15–41)
Albumin: 1.5 g/dL — ABNORMAL LOW (ref 3.5–5.0)
Alkaline Phosphatase: 81 U/L (ref 38–126)
Anion gap: 10 (ref 5–15)
BUN: 5 mg/dL — ABNORMAL LOW (ref 6–20)
CO2: 26 mmol/L (ref 22–32)
Calcium: 7.7 mg/dL — ABNORMAL LOW (ref 8.9–10.3)
Chloride: 100 mmol/L (ref 98–111)
Creatinine, Ser: 0.68 mg/dL (ref 0.61–1.24)
GFR, Estimated: >60 mL/min (ref >60)
Glucose, Bld: 95 mg/dL (ref 70–99)
Potassium: 3 mmol/L — ABNORMAL LOW (ref 3.5–5.1)
Sodium: 136 mmol/L (ref 135–145)
Total Bilirubin: 0.3 mg/dL (ref 0.3–1.2)
Total Protein: 6.1 g/dL — ABNORMAL LOW (ref 6.5–8.1)

## 2021-06-14 LAB — CBC
HCT: 25.9 % — ABNORMAL LOW (ref 39.0–52.0)
Hemoglobin: 8.3 g/dL — ABNORMAL LOW (ref 13.0–17.0)
MCH: 26.8 pg (ref 26.0–34.0)
MCHC: 32 g/dL (ref 30.0–36.0)
MCV: 83.5 fL (ref 80.0–100.0)
Platelets: 543 10*3/uL — ABNORMAL HIGH (ref 150–400)
RBC: 3.1 MIL/uL — ABNORMAL LOW (ref 4.22–5.81)
RDW: 13.8 % (ref 11.5–15.5)
WBC: 8.7 10*3/uL (ref 4.0–10.5)
nRBC: 0 % (ref 0.0–0.2)

## 2021-06-14 LAB — TYPE AND SCREEN
ABO/RH(D): O POS
Antibody Screen: NEGATIVE
Unit division: 0

## 2021-06-14 LAB — ECHOCARDIOGRAM COMPLETE
AR max vel: 2.46 cm2
AV Area VTI: 2.51 cm2
AV Area mean vel: 2.59 cm2
AV Mean grad: 8 mmHg
AV Peak grad: 18.5 mmHg
Ao pk vel: 2.15 m/s
Area-P 1/2: 2.6 cm2
Height: 72 in
MV VTI: 3.69 cm2
S' Lateral: 3.9 cm
Weight: 2591.55 oz

## 2021-06-14 LAB — BPAM RBC
Blood Product Expiration Date: 202303012359
ISSUE DATE / TIME: 202302040410
Unit Type and Rh: 5100

## 2021-06-14 LAB — PTH, INTACT AND CALCIUM
Calcium, Total (PTH): 7.5 mg/dL — ABNORMAL LOW (ref 8.7–10.2)
PTH: 18 pg/mL (ref 15–65)

## 2021-06-14 LAB — URINE CULTURE: Culture: NO GROWTH

## 2021-06-14 MED ORDER — DEXTROSE 5 % IV SOLN
10.0000 mg/kg | Freq: Once | INTRAVENOUS | Status: AC
  Administered 2021-06-14: 735 mg via INTRAVENOUS
  Filled 2021-06-14: qty 14.7

## 2021-06-14 MED ORDER — DEXTROSE 5 % IV SOLN
10.0000 mg/kg | Freq: Three times a day (TID) | INTRAVENOUS | Status: DC
  Administered 2021-06-14 – 2021-06-15 (×4): 735 mg via INTRAVENOUS
  Filled 2021-06-14 (×6): qty 14.7

## 2021-06-14 MED ORDER — IBUPROFEN 400 MG PO TABS
400.0000 mg | ORAL_TABLET | Freq: Four times a day (QID) | ORAL | Status: DC | PRN
  Administered 2021-06-14 – 2021-06-16 (×5): 400 mg via ORAL
  Filled 2021-06-14 (×5): qty 1

## 2021-06-14 MED ORDER — POTASSIUM CHLORIDE CRYS ER 20 MEQ PO TBCR
40.0000 meq | EXTENDED_RELEASE_TABLET | Freq: Once | ORAL | Status: AC
Start: 2021-06-14 — End: 2021-06-14
  Administered 2021-06-14: 40 meq via ORAL
  Filled 2021-06-14: qty 2

## 2021-06-14 MED ORDER — SODIUM CHLORIDE 0.9 % IV SOLN: INTRAVENOUS | Status: DC

## 2021-06-14 MED ORDER — ADULT MULTIVITAMIN W/MINERALS CH
1.0000 | ORAL_TABLET | Freq: Every day | ORAL | Status: DC
  Administered 2021-06-14 – 2021-06-15 (×2): 1 via ORAL
  Filled 2021-06-14 (×2): qty 1

## 2021-06-14 NOTE — Progress Notes (Signed)
Initial Nutrition Assessment  DOCUMENTATION CODES:   Not applicable  INTERVENTION:   Continue Ensure Enlive po BID, each supplement provides 350 kcal and 20 grams of protein.  MVI with minerals daily.  NUTRITION DIAGNOSIS:   Inadequate oral intake related to decreased appetite as evidenced by per patient/family report.  GOAL:   Patient will meet greater than or equal to 90% of their needs  MONITOR:   PO intake, Supplement acceptance  REASON FOR ASSESSMENT:   Malnutrition Screening Tool    ASSESSMENT:   51 yo male admitted with sepsis d/t PNA. PMH includes HIV, seafood allergy, TB.  Spoke with patient over the phone. He reports that his taste buds have been messed up since his blood transfusion (unsure when that occurred). He thinks he has lost a lot of weight. Usual weight in the 180's. He was not eating well because he had no appetite, but that seems to be improving. He likes vegetables, like potatoes, pinto beans, and greens. He likes meat with bones in it. Did not eat the chicken with lunch today because it was boneless. He tried the vanilla Ensure and did not like it; wants to try the strawberry flavor next.  Meal intakes: 100% of breakfast today.  Labs reviewed. K 3  Medications reviewed and include Os-cal, vitamin B-12. IVF: NS at 125 ml/h  No weight history available for review. Patient thinks he weighs in the 130's. Current weight documented at 73.5 kg (162 lbs)  NUTRITION - FOCUSED PHYSICAL EXAM:  Unable to complete  Diet Order:   Diet Order             Diet regular Room service appropriate? Yes; Fluid consistency: Thin  Diet effective now                   EDUCATION NEEDS:   No education needs have been identified at this time  Skin:  Skin Assessment: Reviewed RN Assessment  Last BM:  2/4  Height:   Ht Readings from Last 1 Encounters:  06/12/21 6' (1.829 m)    Weight:   Wt Readings from Last 1 Encounters:  06/12/21 73.5 kg      BMI:  Body mass index is 21.97 kg/m.  Estimated Nutritional Needs:   Kcal:  2000-2200  Protein:  95-115 gm  Fluid:  >/= 2 L    Gabriel Rainwater RD, LDN, CNSC Please refer to Amion for contact information.

## 2021-06-14 NOTE — Progress Notes (Signed)
PROGRESS NOTE        PATIENT DETAILS Name: Mason Moran Age: 51 y.o. Sex: male Date of Birth: [birth year] Admit Date: 06/12/2021 Admitting Physician Emeterio Reeve, DO PCP:Pcp, No  Brief Summary: Patient is a 51 y.o.  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  Significant Hospital events: 2/3>> admit to [facility] PNA/left eye visual loss.  Significant imaging studies: 2/3>> CT head: No acute intracranial abnormality 2/3>> CXR: Large right lower lobe PNA 2/4>> CT chest: 7.2 cm lung abscess in right lower lobe.  Significant microbiology data: 2/3>> COVID/flu PCR: Negative 2/3>> blood culture: No growth 2/3>> urine culture: Pending 2/3>> sputum AFB culture/smear: Pending 2/3>> sputum culture: Pending  Procedures: None  Consults:  ID Ophthalmology  Subjective: Complains of gritty taste in his mouth.  No issues with right eye vision-unchanged compared to yesterday.  Objective: Vitals: Blood pressure 120/70, pulse 98, temperature 100 F (37.8 C), temperature source Oral, resp. rate 20, height 6' (1.829 m), weight 73.5 kg, SpO2 95 %.   Exam: Gen Exam:Alert awake-not in any distress HEENT:atraumatic, normocephalic Chest: B/L clear to auscultation anteriorly CVS:S1S2 regular Abdomen:soft non tender, non distended Extremities:no edema Neurology: Non focal Skin: no rash   Pertinent Labs/Radiology: CBC Latest Ref Rng & Units 06/14/2021 06/13/2021 06/13/2021  WBC 4.0 - 10.5 K/uL 8.7 - 13.8(H)  Hemoglobin 13.0 - 17.0 g/dL 8.3(L) 7.0(L) 6.9(LL)  Hematocrit 39.0 - 52.0 % 25.9(L) 21.8(L) 22.1(L)  Platelets 150 - 400 K/uL 543(H) - 536(H)    Lab Results  Component Value Date   NA 136 06/14/2021   K 3.0 (L) 06/14/2021   CL 100 06/14/2021   CO2 26 06/14/2021      Assessment/Plan: * Sepsis due to RLL lung  abscess/necrotizing pneumonia- (present on admission) Sepsis physiology has resolved-multiple differentials for PNA-CMV, recurrent Mycobacterium kansasii infection-and usual bacterial organisms.  On broad-spectrum antimicrobial therapy with vancomycin/cefepime/acyclovir.  Await sputum cultures/AFB cultures..  Blood cultures negative so far.  ID following.   Acute respiratory failure with hypoxia ([place])- (present on admission) Hypoxia seems to have resolved-on room air.  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  Right eye appears unchanged overnight.  Anemia- (present on admission) Due to underlying chronic disease/HIV-transfused 1 unit of PRBC on 2/4-hemoglobin stable this morning.  No evidence of bleeding.  HIV  (CD4 count of 37 on 2/3)- (present on admission) Recently incarcerated-off antiretrovirals for several years.  ID following-defer PJP prophylaxis/MAI prophylaxis medications to infectious disease.  Elevated platelet count Likely reactive thrombocytosis in the setting of HIV/AIDS.  Transaminitis Unclear etiology-could be related to underlying infectious issue affecting eyes/lung-acute hepatitis serology was negative.  RUQ ultrasound without any acute liver issues.  Thankfully transaminitis slowly improving.  History of TB (tuberculosis) History of Mycobacterium kansasii in 2018-Per chart review and Care Everywhere-this was apparently treated.  Continue airborne isolation-I have discussed with nursing staff to see if we can get sputum induced in the morning-need 3 samples for AFB smears/cultures.  Hyponatremia Mild hyponatremia on admission likely due to hypovolemia-responded to IVF.  Follow electrolytes closely.  Hypokalemia Replete and recheck.  Hypocalcemia Mild borderline hypercalcemia due  to hypoalbuminemia-follow  closely.  Vitamin B12 deficiency Borderline vitamin B12 levels-continue supplementation.  BMI: Estimated body mass index is 21.97 kg/m as calculated from the following:   Height as of this encounter: 6' (1.829 m).   Weight as of this encounter: 73.5 kg.   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    Planned Discharge Destination:Home   Diet: Diet Order             Diet regular Room service appropriate? Yes; Fluid consistency: Thin  Diet effective now                     Antimicrobial agents: Anti-infectives (From admission, onward)    Start     Dose/Rate Route Frequency Ordered Stop   06/14/21 1200  acyclovir (ZOVIRAX) 735 mg in dextrose 5 % 150 mL IVPB  Status:  Discontinued        10 mg/kg  73.5 kg 164.7 mL/hr over 60 Minutes Intravenous Every 8 hours 06/13/21 1442 06/14/21 0823   06/14/21 0930  acyclovir (ZOVIRAX) 735 mg in dextrose 5 % 150 mL IVPB        10 mg/kg  73.5 kg 164.7 mL/hr over 60 Minutes Intravenous Every 8 hours 06/14/21 0823     06/14/21 0415  acyclovir (ZOVIRAX) 735 mg in dextrose 5 % 150 mL IVPB        10 mg/kg  73.5 kg 164.7 mL/hr over 60 Minutes Intravenous  Once 06/14/21 0315 06/14/21 0539   06/14/21 0000  acyclovir (ZOVIRAX) 735 mg in dextrose 5 % 150 mL IVPB  Status:  Discontinued        10 mg/kg  73.5 kg 164.7 mL/hr over 60 Minutes Intravenous Every 8 hours 06/13/21 1402 06/13/21 1442   06/14/21 0000  ganciclovir (CYTOVENE) 370 mg in sodium chloride 0.9 % 100 mL IVPB  Status:  Discontinued        5 mg/kg  73.5 kg 100 mL/hr over 60 Minutes Intravenous Every 12 hours 06/13/21 1442  06/14/21 0315   06/13/21 1500  metroNIDAZOLE (FLAGYL) tablet 500 mg        500 mg Oral Every 12 hours 06/13/21 1411     06/13/21 1500  cefTRIAXone (ROCEPHIN) 2 g in sodium chloride 0.9 % 100 mL IVPB        2 g 200 mL/hr over 30 Minutes Intravenous Every 24 hours 06/13/21 1411     06/13/21 0930  ganciclovir (CYTOVENE) 370 mg in sodium chloride 0.9 % 100 mL IVPB  Status:  Discontinued        5 mg/kg  73.5 kg 100 mL/hr over 60 Minutes Intravenous Every 12 hours 06/13/21 0830 06/13/21 1330   06/12/21 2200  vancomycin (VANCOREADY) IVPB 1250 mg/250 mL  Status:  Discontinued        1,250 mg 166.7 mL/hr over 90 Minutes Intravenous Every 12 hours 06/12/21 1254 06/13/21 1411   06/12/21 2100  acyclovir (ZOVIRAX) 745 mg in dextrose 5 % 150 mL IVPB  Status:  Discontinued        10 mg/kg  74.4 kg 164.9 mL/hr over 60 Minutes Intravenous Every 8 hours 06/12/21 2003 06/13/21 0809   06/12/21 2030  acyclovir (ZOVIRAX) tablet 800 mg  Status:  Discontinued  800 mg Oral 3 times daily 06/12/21 1946 06/12/21 1952   06/12/21 2000  ceFEPIme (MAXIPIME) 2 g in sodium chloride 0.9 % 100 mL IVPB  Status:  Discontinued        2 g 200 mL/hr over 30 Minutes Intravenous Every 8 hours 06/12/21 1254 06/13/21 1411   06/12/21 1100  vancomycin (VANCOCIN) IVPB 1000 mg/200 mL premix        1,000 mg 200 mL/hr over 60 Minutes Intravenous  Once 06/12/21 1046 06/12/21 1347   06/12/21 1100  ceFEPIme (MAXIPIME) 2 g in sodium chloride 0.9 % 100 mL IVPB        2 g 200 mL/hr over 30 Minutes Intravenous  Once 06/12/21 1046 06/12/21 1347        MEDICATIONS: Scheduled Meds:  calcium carbonate  1,000 mg of elemental calcium Oral Q breakfast   cyanocobalamin  1,000 mcg Subcutaneous Daily   enoxaparin (LOVENOX) injection  40 mg Subcutaneous Q24H   feeding supplement  237 mL Oral BID BM   metroNIDAZOLE  500 mg Oral Q12H   Continuous Infusions:  sodium chloride Stopped (06/12/21 1626)   sodium chloride 125 mL/hr at 06/14/21  1224   acyclovir 735 mg (06/14/21 0956)   cefTRIAXone (ROCEPHIN)  IV 2 g (06/13/21 1525)   PRN Meds:.sodium chloride, ibuprofen   I have personally reviewed following labs and imaging studies  LABORATORY DATA: CBC: Recent Labs  Lab 06/12/21 1130 06/12/21 2000 06/13/21 0143 06/13/21 0931 06/14/21 0201  WBC 12.6* 10.2 13.8*  --  8.7  NEUTROABS 10.9*  --   --   --   --   HGB 7.7* 6.3* 6.9* 7.0* 8.3*  HCT 24.6* 20.6* 22.1* 21.8* 25.9*  MCV 82.8 84.4 84.4  --  83.5  PLT 655* 508* 536*  --  543*    Basic Metabolic Panel: Recent Labs  Lab 06/12/21 1130 06/12/21 2000 06/13/21 0143 06/14/21 0201  NA 130*  --  135 136  K 3.3*  --  3.5 3.0*  CL 93*  --  100 100  CO2 27  --  27 26  GLUCOSE 111*  --  114* 95  BUN 8  --  6 5*  CREATININE 0.72 0.62 0.58* 0.68  CALCIUM 7.9*  --  7.7* 7.7*    GFR: Estimated Creatinine Clearance: 113.6 mL/min (by C-G formula based on SCr of 0.68 mg/dL).  Liver Function Tests: Recent Labs  Lab 06/12/21 1130 06/14/21 0201  AST 116* 40  ALT 125* 63*  ALKPHOS 134* 81  BILITOT 0.6 0.3  PROT 7.5 6.1*  ALBUMIN 2.0* 1.5*   No results for input(s): LIPASE, AMYLASE in the last 168 hours. No results for input(s): AMMONIA in the last 168 hours.  Coagulation Profile: Recent Labs  Lab 06/12/21 1130  INR 1.3*    Cardiac Enzymes: No results for input(s): CKTOTAL, CKMB, CKMBINDEX, TROPONINI in the last 168 hours.  BNP (last 3 results) No results for input(s): PROBNP in the last 8760 hours.  Lipid Profile: No results for input(s): CHOL, HDL, LDLCALC, TRIG, CHOLHDL, LDLDIRECT in the last 72 hours.  Thyroid Function Tests: No results for input(s): TSH, T4TOTAL, FREET4, T3FREE, THYROIDAB in the last 72 hours.  Anemia Panel: Recent Labs    06/13/21 0143  VITAMINB12 333  FOLATE 15.6  FERRITIN 854*  TIBC 146*  IRON 14*    Urine analysis:    Component Value Date/Time   COLORURINE AMBER (A) 06/12/2021 1345   APPEARANCEUR CLEAR  06/12/2021 1345  LABSPEC 1.025 06/12/2021 1345   PHURINE 6.0 06/12/2021 1345   GLUCOSEU NEGATIVE 06/12/2021 1345   HGBUR NEGATIVE 06/12/2021 1345   BILIRUBINUR NEGATIVE 06/12/2021 1345   KETONESUR NEGATIVE 06/12/2021 1345   PROTEINUR 30 (A) 06/12/2021 1345   NITRITE NEGATIVE 06/12/2021 1345   LEUKOCYTESUR NEGATIVE 06/12/2021 1345    Sepsis Labs: Lactic Acid, Venous    Component Value Date/Time   LATICACIDVEN 0.9 06/12/2021 1130    MICROBIOLOGY: Recent Results (from the past 240 hour(s))  Expectorated Sputum Assessment w Gram Stain, Rflx to Resp Cult     Status: None   Collection Time: 06/12/21 11:04 AM   Specimen: Sputum  Result Value Ref Range Status   Specimen Description   Final    SPUTUM Performed at [facility], [address] [place], [place] [address]    Special Requests   Final    Immunocompromised Performed at [facility], [address] [place], [place] [address]    Sputum evaluation   Final    THIS SPECIMEN IS ACCEPTABLE FOR SPUTUM CULTURE Performed at [facility], [address], [place], [place] [address]    Report Status 06/12/2021 FINAL  Final  Culture, Respiratory w Gram Stain     Status: None (Preliminary result)   Collection Time: 06/12/21 11:04 AM   Specimen: SPU  Result Value Ref Range Status   Specimen Description SPUTUM  Final   Special Requests Immunocompromised Reflexed from [id number]  Final   Gram Stain   Final    ABUNDANT WBC PRESENT,BOTH PMN AND MONONUCLEAR ABUNDANT GRAM POSITIVE COCCI FEW GRAM NEGATIVE RODS FEW GRAM POSITIVE RODS    Culture   Final    CULTURE REINCUBATED FOR BETTER GROWTH Performed at [facility], [place] [address], [place], [place] [address]    Report Status PENDING  Incomplete  Resp Panel by RT-PCR (Flu A&B, Covid) Nasopharyngeal Swab     Status: None   Collection Time: 06/12/21 11:30 AM   Specimen: Nasopharyngeal Swab; Nasopharyngeal(NP) swabs in vial transport medium  Result  Value Ref Range Status   SARS Coronavirus 2 by RT PCR NEGATIVE NEGATIVE Final    Comment: (NOTE) SARS-CoV-2 target nucleic acids are NOT DETECTED.  The SARS-CoV-2 RNA is generally detectable in upper respiratory specimens during the acute phase of infection. The lowest concentration of SARS-CoV-2 viral copies this assay can detect is 138 copies/mL. A negative result does not preclude SARS-Cov-2 infection and should not be used as the sole basis for treatment or other patient management decisions. A negative result may occur with  improper specimen collection/handling, submission of specimen other than nasopharyngeal swab, presence of viral mutation(s) within the areas targeted by this assay, and inadequate number of viral copies(<138 copies/mL). A negative result must be combined with clinical observations, patient history, and epidemiological information. The expected result is Negative.  Fact Sheet for Patients:  EntrepreneurPulse.com.au  Fact Sheet for Healthcare Providers:  IncredibleEmployment.be  This test is no t yet approved or cleared by the [place] FDA and  has been authorized for detection and/or diagnosis of SARS-CoV-2 by FDA under an Emergency Use Authorization (EUA). This EUA will remain  in effect (meaning this test can be used) for the duration of the COVID-19 declaration under Section 564(b)(1) of the Act, 21 U.S.C.section 360bbb-3(b)(1), unless the authorization is terminated  or revoked sooner.       Influenza A by PCR NEGATIVE NEGATIVE Final   Influenza B by PCR  NEGATIVE NEGATIVE Final    Comment: (NOTE) The Xpert Xpress SARS-CoV-2/FLU/RSV plus assay is intended as an aid in the diagnosis of influenza from Nasopharyngeal swab specimens and should not be used as a sole basis for treatment. Nasal washings and aspirates are unacceptable for Xpert Xpress SARS-CoV-2/FLU/RSV testing.  Fact Sheet for  Patients: EntrepreneurPulse.com.au  Fact Sheet for Healthcare Providers: IncredibleEmployment.be  This test is not yet approved or cleared by the [place] FDA and has been authorized for detection and/or diagnosis of SARS-CoV-2 by FDA under an Emergency Use Authorization (EUA). This EUA will remain in effect (meaning this test can be used) for the duration of the COVID-19 declaration under Section 564(b)(1) of the Act, 21 U.S.C. section 360bbb-3(b)(1), unless the authorization is terminated or revoked.  Performed at [facility], [address] [place], [place] [address]   Blood Culture (routine x 2)     Status: None (Preliminary result)   Collection Time: 06/12/21 11:30 AM   Specimen: BLOOD  Result Value Ref Range Status   Specimen Description   Final    BLOOD Blood Culture adequate volume Performed at [facility], [address] [place], [place] [address]    Special Requests   Final    BOTTLES DRAWN AEROBIC AND ANAEROBIC RIGHT ANTECUBITAL Performed at [facility], [address] [place], [place] [address]    Culture   Final    NO GROWTH 2 DAYS Performed at [facility], [place] [address], [place], [place] [address]    Report Status PENDING  Incomplete  Blood Culture (routine x 2)     Status: None (Preliminary result)   Collection Time: 06/12/21 11:50 AM   Specimen: BLOOD  Result Value Ref Range Status   Specimen Description   Final    BLOOD Blood Culture adequate volume Performed at [facility], [address] [place], [place] [address]    Special Requests   Final    BOTTLES DRAWN AEROBIC AND ANAEROBIC BLOOD RIGHT FOREARM Performed at [facility], [address] [place], [place] [address]    Culture   Final    NO GROWTH 2 DAYS Performed at [facility], [place] [address], [place], [place] [address]    Report Status PENDING  Incomplete  Urine Culture      Status: None   Collection Time: 06/12/21  1:45 PM   Specimen: In/Out Cath Urine  Result Value Ref Range Status   Specimen Description   Final    IN/OUT CATH URINE Performed at [facility], [address] [place], [place] [address]    Special Requests   Final    Immunocompromised Performed at [facility], [address] [place], [place] [address]    Culture   Final    NO GROWTH Performed at [facility], [place] [address] [place], [place] [address]    Report Status 06/14/2021 FINAL  Final  Acid Fast Smear (AFB)     Status: None   Collection Time: 06/12/21  1:45 PM   Specimen: Sputum  Result Value Ref Range Status   AFB Specimen Processing Concentration  Final   Acid Fast Smear Negative  Final    Comment: (NOTE) Performed At: [facility] [address] [place], [place] [id number] Rush Farmer MD [id number]    Source (AFB) SPUTUM  Final    Comment: Performed at [facility], [address] [place], [place] [address]    RADIOLOGY STUDIES/RESULTS: CT CHEST W CONTRAST  Result Date: 06/13/2021 CLINICAL DATA:  Pneumonia, evaluate for complications EXAM: CT CHEST WITH CONTRAST TECHNIQUE: Multidetector CT imaging of the chest was performed during intravenous contrast administration. RADIATION DOSE REDUCTION: This exam was performed according to the departmental dose-optimization program which includes automated exposure control, adjustment of the mA and/or kV according to patient size and/or use of iterative reconstruction technique. CONTRAST:  38mL OMNIPAQUE IOHEXOL 300 MG/ML  SOLN COMPARISON:  Chest radiographs done on 06/12/2021 FINDINGS: Cardiovascular: Heart is enlarged in size. Small pericardial effusion is present. Mediastinum/Nodes: No significant lymphadenopathy seen. Lungs/Pleura: There is large infiltrate in the posterior right lower lobe. There are multiple loculated fluid collections with air-fluid levels within the  infiltrate. Largest of these collections measures approximately 7.2 cm. Small right pleural effusion is present. There are no pockets of air in the right pleural effusion. Small subpleural patchy density is seen in the left lower lobe. There is small linear density in the posterior right apex, possibly suggesting scarring. There are few small pleural-based nodules in the right upper lung fields each measuring less than 4 mm. There is mild ectasia of bronchi. Upper Abdomen: Liver is enlarged in size. Musculoskeletal: Unremarkable. IMPRESSION: There is large infiltrate in the posterior right lower lobe suggesting pneumonia. There are multiple loculated fluid collections with air-fluid levels within this infiltrate. Findings suggest necrotic pneumonia with multiple lung abscesses largest measuring 7.2 cm in diameter. Small right pleural effusion is present. There is no pneumothorax. Small patchy infiltrate is seen in the subpleural location in the posterior left lower lobe suggesting atelectasis/pneumonia. Linear density in the right apex may suggest scarring. Small pericardial effusion is present. These results will be called to the ordering clinician or representative by the Radiologist Assistant, and communication documented in the PACS or [employer]. Electronically Signed   By: Elmer Picker M.D.   On: 06/13/2021 12:05   US Abdomen Limited RUQ (LIVER/GB)  Result Date: 06/13/2021 CLINICAL DATA:  Transaminitis EXAM: ULTRASOUND ABDOMEN LIMITED RIGHT UPPER QUADRANT COMPARISON:  None. FINDINGS: Gallbladder: Gallbladder is contracted. No gallbladder wall thickening or pericholecystic fluid. Questionable 5 mm stone within the gallbladder fundus region. No sonographic Murphy's sign elicited during the exam. Common bile duct: Diameter: 8 mm Liver: No focal lesion identified. Within normal limits in parenchymal echogenicity. Portal vein is patent on color Doppler imaging with normal direction of blood flow  towards the liver. Other: RIGHT pleural effusion. IMPRESSION: 1. Gallbladder is unremarkable. No evidence of cholecystitis. Questionable 5 mm gallstone, versus artifact. 2. CBD dilatation, measuring 8 mm diameter. Recommend correlation with liver function tests. If abnormal LFTs, when the patient is clinically stable and able to follow directions and hold their breath (preferably as an outpatient), further evaluation with dedicated MRCP should be considered. Otherwise, consider follow-up RIGHT upper quadrant ultrasound in 3-6 months to ensure stability. Electronically Signed   By: Franki Cabot M.D.   On: 06/13/2021 15:41     LOS: 2 days   Oren Binet, MD  [facility]    To contact the attending provider between 7A-7P or the covering provider during after hours 7P-7A, please log into the web site [email or web address] and access using universal [place] password for that web site. If you do not have the password, please call the hospital operator.  06/14/2021, 12:24 PM

## 2021-06-14 NOTE — Progress Notes (Signed)
*  PRELIMINARY RESULTS* Echocardiogram 2D Echocardiogram has been performed.  Mason Moran 06/14/2021, 3:46 PM

## 2021-06-15 ENCOUNTER — Other Ambulatory Visit (HOSPITAL_COMMUNITY): Payer: Self-pay

## 2021-06-15 LAB — CBC
HCT: 23.9 % — ABNORMAL LOW (ref 39.0–52.0)
Hemoglobin: 7.4 g/dL — ABNORMAL LOW (ref 13.0–17.0)
MCH: 26.2 pg (ref 26.0–34.0)
MCHC: 31 g/dL (ref 30.0–36.0)
MCV: 84.8 fL (ref 80.0–100.0)
Platelets: 556 10*3/uL — ABNORMAL HIGH (ref 150–400)
RBC: 2.82 MIL/uL — ABNORMAL LOW (ref 4.22–5.81)
RDW: 13.8 % (ref 11.5–15.5)
WBC: 8.1 10*3/uL (ref 4.0–10.5)
nRBC: 0 % (ref 0.0–0.2)

## 2021-06-15 LAB — COMPREHENSIVE METABOLIC PANEL
ALT: 49 U/L — ABNORMAL HIGH (ref 0–44)
AST: 30 U/L (ref 15–41)
Albumin: <1.5 g/dL — ABNORMAL LOW (ref 3.5–5.0)
Alkaline Phosphatase: 79 U/L (ref 38–126)
Anion gap: 10 (ref 5–15)
BUN: 6 mg/dL (ref 6–20)
CO2: 24 mmol/L (ref 22–32)
Calcium: 7.3 mg/dL — ABNORMAL LOW (ref 8.9–10.3)
Chloride: 99 mmol/L (ref 98–111)
Creatinine, Ser: 0.68 mg/dL (ref 0.61–1.24)
GFR, Estimated: >60 mL/min (ref >60)
Glucose, Bld: 95 mg/dL (ref 70–99)
Potassium: 3.3 mmol/L — ABNORMAL LOW (ref 3.5–5.1)
Sodium: 133 mmol/L — ABNORMAL LOW (ref 135–145)
Total Bilirubin: 0.4 mg/dL (ref 0.3–1.2)
Total Protein: 6 g/dL — ABNORMAL LOW (ref 6.5–8.1)

## 2021-06-15 LAB — CULTURE, RESPIRATORY W GRAM STAIN: Culture: NORMAL

## 2021-06-15 LAB — MAGNESIUM: Magnesium: 1.7 mg/dL (ref 1.7–2.4)

## 2021-06-15 MED ORDER — ADULT MULTIVITAMIN W/MINERALS CH
1.0000 | ORAL_TABLET | Freq: Every day | ORAL | Status: DC
  Administered 2021-06-16: 1 via ORAL
  Filled 2021-06-15: qty 1

## 2021-06-15 MED ORDER — CALCIUM CARBONATE 1250 (500 CA) MG PO TABS
1000.0000 mg | ORAL_TABLET | Freq: Every day | ORAL | Status: DC
  Administered 2021-06-16: 1000 mg via ORAL
  Filled 2021-06-15: qty 1

## 2021-06-15 MED ORDER — POTASSIUM CHLORIDE CRYS ER 20 MEQ PO TBCR
40.0000 meq | EXTENDED_RELEASE_TABLET | ORAL | Status: AC
  Administered 2021-06-15 (×2): 40 meq via ORAL
  Filled 2021-06-15 (×2): qty 2

## 2021-06-15 MED ORDER — SULFAMETHOXAZOLE-TRIMETHOPRIM 800-160 MG PO TABS
1.0000 | ORAL_TABLET | Freq: Every day | ORAL | Status: DC
  Administered 2021-06-15 – 2021-06-17 (×3): 1 via ORAL
  Filled 2021-06-15 (×3): qty 1

## 2021-06-15 MED ORDER — VALGANCICLOVIR HCL 450 MG PO TABS
900.0000 mg | ORAL_TABLET | Freq: Every day | ORAL | Status: DC
  Administered 2021-06-15 – 2021-06-16 (×2): 900 mg via ORAL
  Filled 2021-06-15 (×2): qty 2

## 2021-06-15 MED ORDER — MAGNESIUM SULFATE 2 GM/50ML IV SOLN
2.0000 g | Freq: Once | INTRAVENOUS | Status: AC
Start: 2021-06-15 — End: 2021-06-15
  Administered 2021-06-15: 2 g via INTRAVENOUS
  Filled 2021-06-15: qty 50

## 2021-06-15 MED ORDER — SODIUM CHLORIDE 0.9 % IV SOLN
3.0000 g | Freq: Four times a day (QID) | INTRAVENOUS | Status: DC
  Administered 2021-06-15 – 2021-06-17 (×9): 3 g via INTRAVENOUS
  Filled 2021-06-15 (×9): qty 8

## 2021-06-15 MED ORDER — GUAIFENESIN 100 MG/5ML PO LIQD
5.0000 mL | ORAL | Status: DC | PRN
  Administered 2021-06-15 – 2021-06-16 (×2): 5 mL via ORAL
  Filled 2021-06-15 (×2): qty 5

## 2021-06-15 MED ORDER — BICTEGRAVIR-EMTRICITAB-TENOFOV 50-200-25 MG PO TABS
1.0000 | ORAL_TABLET | Freq: Every day | ORAL | Status: DC
  Administered 2021-06-15 – 2021-06-17 (×3): 1 via ORAL
  Filled 2021-06-15 (×3): qty 1

## 2021-06-15 MED ORDER — VALGANCICLOVIR HCL 450 MG PO TABS
900.0000 mg | ORAL_TABLET | Freq: Two times a day (BID) | ORAL | Status: DC
  Filled 2021-06-15: qty 2

## 2021-06-15 NOTE — Progress Notes (Signed)
Pharmacy Antibiotic Note  Mason Moran is a 51 y.o. male admitted on 06/12/2021 with pneumonia. 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 Pharmacy has been consulted for acyclovir and unasyn dosing.  Afeb, WBC down to nl, CD4 37  Pt was on flagyl and ceftriaxone but pt with c/o metallic taste in mouth so changing to Unasyn.  Plan: Continue acyclovir 735 mg (10 mg/kg) IV Q 8 hours Unasyn 3gm IV q6h F/u renal fxn, CX results, clinical status F/u ability to transition to oral valacyclovir  Height: 6' (182.9 cm) Weight: 73.5 kg (161 lb 15.6 oz) IBW/kg (Calculated) : 77.6  Temp (24hrs), Avg:99 F (37.2 C), Min:97.6 F (36.4 C), Max:101.3 F (38.5 C)  Recent Labs  Lab 06/12/21 1130 06/12/21 2000 06/13/21 0143 06/14/21 0201 06/15/21 0343  WBC 12.6* 10.2 13.8* 8.7 8.1  CREATININE 0.72 0.62 0.58* 0.68 0.68  LATICACIDVEN 0.9  --   --   --   --      Estimated Creatinine Clearance: 113.6 mL/min (by C-G formula based on SCr of 0.68 mg/dL).    Allergies  Allergen Reactions   Dapsone Other (See Comments)    Other reaction(s): Other (See Comments) G6PD deficiency   Other Other (See Comments)    Seafood - can't speak   Primaquine     Other reaction(s): Other (See Comments) G6PD deficiency    Antimicrobials this admission: Vanc 2/3>> 2/4 Cefepime 2/3>> 2/4 Acyclovir 2/3 >> 2/4, 2/5 >> Ganiciclovir 2/4 x 2 doses CTX 2/4 > 2/6 Flagyl 2/4 > 2/6 Unasyn 2/6 >>  Microbiology results: 2/3 BCx: ngtd 2/3 sputum: abundant GPC, few GNR/GPR - reincubated 2/3 UCx: neg  Thank you for allowing pharmacy to be a part of this patients care.  Christoper Fabian, PharmD, BCPS Please see amion for complete clinical pharmacist phone list 06/15/2021 9:53 AM

## 2021-06-15 NOTE — Progress Notes (Signed)
Patient son Mason Moran. visiting with patient at bedside.  Patient's son requested information from RN on patient status and clinical diagnosis when exiting patient room to leave for the day.   RN and patient's son went back in to the patient's room, confirmed with the patient that it was okay for RN to provide info to patient's son.  Patient's son had some questions for MD, RN confirmed with patient okay for MD to call patient's son to discuss patient's clinical status and update on patient status.  Patient advised yes it was okay for MD to update son.  Added patient's son's contact info to Epic.

## 2021-06-15 NOTE — TOC Benefit Eligibility Note (Signed)
Patient Advocate Encounter  Completed and sent [employer] Advancing Access application for [place] for this patient who is uninsured.      BIN      [id number]  PCN    [id number] GRP    [id number] ID        [id number]     Lyndel Safe, [place] Patient Advocate Specialist [place] Patient Advocate Team Direct Number: [phone]  Fax: [phone]

## 2021-06-15 NOTE — TOC Initial Note (Signed)
Transition of Care [facility]) - Initial/Assessment Note    Patient Details  Name: Mason Moran MRN: [id number] Date of Birth: 1970-05-29  Transition of Care [facility]) CM/SW Contact:    Harriet Masson, RN Phone Number: 06/15/2021, 12:28 PM  Clinical Narrative:              Spoke to patient regarding PCP and no insurance. Patient agreeable to speak to someone regarding medicaid application and agreeable for TOC to make him a PCP apt.    PCP apt made. Referral made to [employer] in regards to [facility] application. Patient states he has a car and can drive himself to apts TOC will continue to follow for needs.   Expected Discharge Plan: Home/Self Care Barriers to Discharge: Continued Medical Work up   Patient Goals and CMS Choice Patient states their goals for this hospitalization and ongoing recovery are:: return home      Expected Discharge Plan and Services Expected Discharge Plan: Home/Self Care   Discharge Planning Services: [facility], Follow-up appt scheduled   Living arrangements for the past 2 months: Single Family Home                                      Prior Living Arrangements/Services Living arrangements for the past 2 months: Single Family Home     Do you feel safe going back to the place where you live?: Yes               Activities of Daily Living Home Assistive Devices/Equipment: None ADL Screening (condition at time of admission) Patient's cognitive ability adequate to safely complete daily activities?: Yes Is the patient deaf or have difficulty hearing?: No Does the patient have difficulty seeing, even when wearing glasses/contacts?: Yes Does the patient have difficulty concentrating, remembering, or making decisions?: No Patient able to express need for assistance with ADLs?: No Does the patient have difficulty dressing or bathing?: No Independently performs ADLs?: Yes (appropriate for developmental age) Does the patient have difficulty  walking or climbing stairs?: No Weakness of Legs: None Weakness of Arms/Hands: None  Permission Sought/Granted                  Emotional Assessment   Attitude/Demeanor/Rapport: Engaged Affect (typically observed): Accepting Orientation: : Oriented to Self, Oriented to Place, Oriented to  Time, Oriented to Situation   Psych Involvement: No (comment)  Admission diagnosis:  Vision loss [H54.7] Acute respiratory failure with hypoxia (HCC) [J96.01] Sepsis due to pneumonia (HCC) [J18.9, A41.9] Community acquired pneumonia of right lower lobe of lung [J18.9] Sepsis, due to unspecified organism, unspecified whether acute organ dysfunction present [facility]) [A41.9] Patient Active Problem List   Diagnosis Date Noted   Vitamin B12 deficiency 06/13/2021   Sepsis due to RLL lung abscess/necrotizing pneumonia 06/12/2021   HIV  (CD4 count of 37 on 2/3) 06/12/2021   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 [place] [address] Phone: [phone] Fax:  [phone]     Social Determinants of Health (SDOH) Interventions    Readmission Risk Interventions No flowsheet data found.

## 2021-06-15 NOTE — Progress Notes (Signed)
Patient currently on precautions for rule out Tuberculosis.  Contact and airborne precautions currently in place.   Called and spoke with infection prevention, infection prevention advised patient only requires airborne precautions.  Infection prevention advised patient should also be in a negative pressure room.   Contact precautions discontinued.   Charge RN notified of need for negative pressure room.

## 2021-06-15 NOTE — Progress Notes (Addendum)
PROGRESS NOTE        PATIENT DETAILS Name: Mason Moran Age: 51 y.o. Sex: male Date of Birth: 1970-06-11 Admit Date: 06/12/2021 Admitting Physician Emeterio Reeve, DO PCP:Pcp, No  Brief Summary: Patient is a 51 y.o.  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  Significant Hospital events: 2/3>> admit to [facility] PNA/left eye visual loss.  Significant imaging studies: 2/3>> CT head: No acute intracranial abnormality 2/3>> CXR: Large right lower lobe PNA 2/4>> CT chest: 7.2 cm lung abscess in right lower lobe.  Significant microbiology data: 2/3>> COVID/flu PCR: Negative 2/3>> blood culture: No growth 2/3>> urine culture: Pending 2/3>> sputum AFB culture/smear: Pending 2/3>> sputum culture: Pending  Procedures: None  Consults:  ID Ophthalmology  Subjective: Complains of gritty taste in his mouth.   Objective: Vitals: Blood pressure 115/76, pulse 90, temperature 99.1 F (37.3 C), temperature source Oral, resp. rate 20, height 6' (1.829 m), weight 73.5 kg, SpO2 96 %.   Exam: Gen Exam:Alert awake-not in any distress HEENT:atraumatic, normocephalic Chest: B/L clear to auscultation anteriorly CVS:S1S2 regular Abdomen:soft non tender, non distended Extremities:no edema Neurology: Non focal Skin: no rash   Pertinent Labs/Radiology: CBC Latest Ref Rng & Units 06/15/2021 06/14/2021 06/13/2021  WBC 4.0 - 10.5 K/uL 8.1 8.7 -  Hemoglobin 13.0 - 17.0 g/dL 7.4(L) 8.3(L) 7.0(L)  Hematocrit 39.0 - 52.0 % 23.9(L) 25.9(L) 21.8(L)  Platelets 150 - 400 K/uL 556(H) 543(H) -    Lab Results  Component Value Date   NA 133 (L) 06/15/2021   K 3.3 (L) 06/15/2021   CL 99 06/15/2021   CO2 24 06/15/2021      Assessment/Plan: * Sepsis due to RLL lung abscess/necrotizing pneumonia- (present on admission) Sepsis  physiology has resolved-multiple differentials for PNA-CMV, recurrent Mycobacterium kansasii infection-and usual bacterial organisms.  Sputum cultures negative-awaiting further AFB cultures/smears.  Given persistent gritty/metallic taste in mouth-we will switch to Unasyn-remains on acyclovir.  Will await further recommendations from infectious disease.  Acute respiratory failure with hypoxia ([place])- (present on admission) Hypoxia seems to have resolved-on room air.  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  Right eye appears unchanged overnight.  Anemia- (present on admission) Due to underlying chronic disease/HIV-transfused 1 unit of PRBC on 2/4-hemoglobin stable this morning.  No evidence of bleeding.  HIV  (CD4 count of 37 on 2/3)- (present on admission) Recently incarcerated-off antiretrovirals for several years.  ID following-defer PJP prophylaxis/MAI prophylaxis medications to infectious disease.  Elevated platelet count Likely reactive thrombocytosis in the setting of HIV/AIDS.  Transaminitis Unclear etiology-could be related to underlying infectious issue affecting eyes/lung-acute hepatitis serology was negative.  RUQ ultrasound without any acute liver issues.  Thankfully transaminitis slowly improving.  History of TB (tuberculosis) History of Mycobacterium kansasii in 2018-Per chart review and Care Everywhere-this was apparently treated.  Continue airborne isolation-follow sputum AFB smears/cultures-x1 negative so far.  RN staff aware of the need to recollect sputum tomorrow morning.  Hyponatremia Mild hyponatremia on admission likely due to hypovolemia-responded to IVF.  Follow electrolytes closely.  Hypokalemia Replete and recheck.  Hypocalcemia Mild borderline hypercalcemia due to  hypoalbuminemia-follow closely.  Vitamin B12 deficiency  Borderline vitamin B12 levels-continue supplementation.  BMI: Estimated body mass index is 21.97 kg/m as calculated from the following:   Height as of this encounter: 6' (1.829 m).   Weight as of this encounter: 73.5 kg.   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    Planned Discharge Destination:Home   Diet: Diet Order             Diet regular Room service appropriate? Yes; Fluid consistency: Thin  Diet effective now                     Antimicrobial agents: Anti-infectives (From admission, onward)    Start     Dose/Rate Route Frequency Ordered Stop   06/15/21 1315  sulfamethoxazole-trimethoprim (BACTRIM DS) 800-160 MG per tablet 1 tablet        1 tablet Oral Daily 06/15/21 1219     06/15/21 1000  Ampicillin-Sulbactam (UNASYN) 3 g in sodium chloride 0.9 % 100 mL IVPB        3 g 200 mL/hr over 30 Minutes Intravenous Every 6 hours 06/15/21 0956     06/14/21 1200  acyclovir (ZOVIRAX) 735 mg in dextrose 5 % 150 mL IVPB  Status:  Discontinued        10 mg/kg  73.5 kg 164.7 mL/hr over 60 Minutes Intravenous Every 8 hours 06/13/21 1442 06/14/21 0823   06/14/21 0930  acyclovir (ZOVIRAX) 735 mg in dextrose 5 % 150 mL IVPB        10 mg/kg  73.5 kg 164.7 mL/hr over 60 Minutes Intravenous Every 8 hours 06/14/21 0823     06/14/21 0415  acyclovir (ZOVIRAX) 735 mg in dextrose 5 % 150 mL IVPB        10 mg/kg  73.5 kg 164.7 mL/hr over 60 Minutes Intravenous  Once 06/14/21 0315 06/14/21 0539   06/14/21 0000  acyclovir  (ZOVIRAX) 735 mg in dextrose 5 % 150 mL IVPB  Status:  Discontinued        10 mg/kg  73.5 kg 164.7 mL/hr over 60 Minutes Intravenous Every 8 hours 06/13/21 1402 06/13/21 1442   06/14/21 0000  ganciclovir (CYTOVENE) 370 mg in sodium chloride 0.9 % 100 mL IVPB  Status:  Discontinued        5 mg/kg  73.5 kg 100 mL/hr over 60 Minutes Intravenous Every 12 hours 06/13/21 1442 06/14/21 0315   06/13/21 1500  metroNIDAZOLE (FLAGYL) tablet 500 mg  Status:  Discontinued        500 mg Oral Every 12 hours 06/13/21 1411 06/15/21 0943   06/13/21 1500  cefTRIAXone (ROCEPHIN) 2 g in sodium chloride 0.9 % 100 mL IVPB  Status:  Discontinued        2 g 200 mL/hr over 30 Minutes Intravenous Every 24 hours 06/13/21 1411 06/15/21 0943   06/13/21 0930  ganciclovir (CYTOVENE) 370 mg in sodium chloride 0.9 % 100 mL IVPB  Status:  Discontinued        5 mg/kg  73.5 kg 100 mL/hr over 60 Minutes Intravenous Every 12 hours 06/13/21 0830 06/13/21 1330   06/12/21 2200  vancomycin (VANCOREADY) IVPB 1250 mg/250 mL  Status:  Discontinued        1,250 mg 166.7 mL/hr over 90 Minutes Intravenous Every 12 hours 06/12/21 1254 06/13/21 1411   06/12/21 2100  acyclovir (ZOVIRAX) 745 mg in dextrose 5 % 150 mL IVPB  Status:  Discontinued        10 mg/kg  74.4 kg 164.9 mL/hr over 60 Minutes Intravenous Every 8 hours 06/12/21 2003 06/13/21 0809   06/12/21 2030  acyclovir (ZOVIRAX) tablet 800 mg  Status:  Discontinued        800 mg Oral 3 times daily 06/12/21 1946 06/12/21 1952   06/12/21 2000  ceFEPIme (MAXIPIME) 2 g in sodium chloride 0.9 % 100 mL IVPB  Status:  Discontinued        2 g 200 mL/hr over 30 Minutes Intravenous Every 8 hours 06/12/21 1254 06/13/21 1411   06/12/21 1100  vancomycin (VANCOCIN) IVPB 1000 mg/200 mL premix        1,000 mg 200 mL/hr over 60 Minutes Intravenous  Once 06/12/21 1046 06/12/21 1347   06/12/21 1100  ceFEPIme (MAXIPIME) 2 g in sodium chloride 0.9 % 100 mL IVPB        2 g 200 mL/hr over 30 Minutes  Intravenous  Once 06/12/21 1046 06/12/21 1347        MEDICATIONS: Scheduled Meds:  calcium carbonate  1,000 mg of elemental calcium Oral Q breakfast   cyanocobalamin  1,000 mcg Subcutaneous Daily   enoxaparin (LOVENOX) injection  40 mg Subcutaneous Q24H   feeding supplement  237 mL Oral BID BM   multivitamin with minerals  1 tablet Oral Daily   sulfamethoxazole-trimethoprim  1 tablet Oral Daily   Continuous Infusions:  sodium chloride Stopped (06/12/21 1342)   sodium chloride 125 mL/hr at 06/15/21 0800   acyclovir 735 mg (06/15/21 1103)   ampicillin-sulbactam (UNASYN) IV 3 g (06/15/21 1229)   PRN Meds:.sodium chloride, guaiFENesin, ibuprofen   I have personally reviewed following labs and imaging studies  LABORATORY DATA: CBC: Recent Labs  Lab 06/12/21 1130 06/12/21 2000 06/13/21 0143 06/13/21 0931 06/14/21 0201 06/15/21 0343  WBC 12.6* 10.2 13.8*  --  8.7 8.1  NEUTROABS 10.9*  --   --   --   --   --   HGB 7.7* 6.3* 6.9* 7.0* 8.3* 7.4*  HCT 24.6* 20.6* 22.1* 21.8* 25.9* 23.9*  MCV 82.8 84.4 84.4  --  83.5 84.8  PLT 655* 508* 536*  --  543* 556*    Basic Metabolic Panel: Recent Labs  Lab 06/12/21 1130 06/12/21 2000 06/13/21 0143 06/14/21 0201 06/15/21 0343  NA 130*  --  135 136 133*  K 3.3*  --  3.5 3.0* 3.3*  CL 93*  --  100 100 99  CO2 27  --  27 26 24   GLUCOSE 111*  --  114* 95 95  BUN 8  --  6 5* 6  CREATININE 0.72 0.62 0.58* 0.68 0.68  CALCIUM 7.9*  --  7.7*   7.5* 7.7* 7.3*  MG  --   --   --   --  1.7    GFR: Estimated Creatinine Clearance: 113.6 mL/min (by C-G formula based on SCr of 0.68 mg/dL).  Liver Function Tests: Recent Labs  Lab 06/12/21 1130 06/14/21 0201 06/15/21 0343  AST 116* 40 30  ALT 125* 63* 49*  ALKPHOS 134* 81 79  BILITOT 0.6 0.3 0.4  PROT 7.5 6.1* 6.0*  ALBUMIN 2.0* 1.5* <1.5*   No results for input(s): LIPASE, AMYLASE in  the last 168 hours. No results for input(s): AMMONIA in the last 168 hours.  Coagulation  Profile: Recent Labs  Lab 06/12/21 1130  INR 1.3*    Cardiac Enzymes: No results for input(s): CKTOTAL, CKMB, CKMBINDEX, TROPONINI in the last 168 hours.  BNP (last 3 results) No results for input(s): PROBNP in the last 8760 hours.  Lipid Profile: No results for input(s): CHOL, HDL, LDLCALC, TRIG, CHOLHDL, LDLDIRECT in the last 72 hours.  Thyroid Function Tests: No results for input(s): TSH, T4TOTAL, FREET4, T3FREE, THYROIDAB in the last 72 hours.  Anemia Panel: Recent Labs    06/13/21 0143  VITAMINB12 333  FOLATE 15.6  FERRITIN 854*  TIBC 146*  IRON 14*    Urine analysis:    Component Value Date/Time   COLORURINE AMBER (A) 06/12/2021 1345   APPEARANCEUR CLEAR 06/12/2021 1345   LABSPEC 1.025 06/12/2021 1345   PHURINE 6.0 06/12/2021 1345   GLUCOSEU NEGATIVE 06/12/2021 1345   HGBUR NEGATIVE 06/12/2021 1345   BILIRUBINUR NEGATIVE 06/12/2021 1345   KETONESUR NEGATIVE 06/12/2021 1345   PROTEINUR 30 (A) 06/12/2021 1345   NITRITE NEGATIVE 06/12/2021 1345   LEUKOCYTESUR NEGATIVE 06/12/2021 1345    Sepsis Labs: Lactic Acid, Venous    Component Value Date/Time   LATICACIDVEN 0.9 06/12/2021 1130    MICROBIOLOGY: Recent Results (from the past 240 hour(s))  Expectorated Sputum Assessment w Gram Stain, Rflx to Resp Cult     Status: None   Collection Time: 06/12/21 11:04 AM   Specimen: Sputum  Result Value Ref Range Status   Specimen Description   Final    SPUTUM Performed at [facility], [address] [place], [place] [address]    Special Requests   Final    Immunocompromised Performed at [facility], [address] [place], [place] [address]    Sputum evaluation   Final    THIS SPECIMEN IS ACCEPTABLE FOR SPUTUM CULTURE Performed at [facility], [place] [address] [place], [place] [address]    Report Status 06/12/2021 FINAL  Final  Culture, Respiratory w Gram Stain     Status: None   Collection Time: 06/12/21 11:04 AM    Specimen: SPU  Result Value Ref Range Status   Specimen Description SPUTUM  Final   Special Requests Immunocompromised Reflexed from [id number]  Final   Gram Stain   Final    ABUNDANT WBC PRESENT,BOTH PMN AND MONONUCLEAR ABUNDANT GRAM POSITIVE COCCI FEW GRAM NEGATIVE RODS FEW GRAM POSITIVE RODS    Culture   Final    MODERATE Consistent with normal respiratory flora. Performed at [facility], [place] [address] [place], [place] [address]    Report Status 06/15/2021 FINAL  Final  Resp Panel by RT-PCR (Flu A&B, Covid) Nasopharyngeal Swab     Status: None   Collection Time: 06/12/21 11:30 AM   Specimen: Nasopharyngeal Swab; Nasopharyngeal(NP) swabs in vial transport medium  Result Value Ref Range Status   SARS Coronavirus 2 by RT PCR NEGATIVE NEGATIVE Final    Comment: (NOTE) SARS-CoV-2 target nucleic acids are NOT DETECTED.  The SARS-CoV-2 RNA is generally detectable in upper respiratory specimens during the acute phase of infection. The lowest concentration of SARS-CoV-2 viral copies this assay can detect is 138 copies/mL. A negative result does not preclude SARS-Cov-2 infection and should not be used as the sole basis for treatment or other patient management decisions. A negative result may occur with  improper specimen collection/handling, submission of specimen  other than nasopharyngeal swab, presence of viral mutation(s) within the areas targeted by this assay, and inadequate number of viral copies(<138 copies/mL). A negative result must be combined with clinical observations, patient history, and epidemiological information. The expected result is Negative.  Fact Sheet for Patients:  EntrepreneurPulse.com.au  Fact Sheet for Healthcare Providers:  IncredibleEmployment.be  This test is no t yet approved or cleared by the [place] FDA and  has been authorized for detection and/or diagnosis of SARS-CoV-2 by FDA under an Emergency Use  Authorization (EUA). This EUA will remain  in effect (meaning this test can be used) for the duration of the COVID-19 declaration under Section 564(b)(1) of the Act, 21 U.S.C.section 360bbb-3(b)(1), unless the authorization is terminated  or revoked sooner.       Influenza A by PCR NEGATIVE NEGATIVE Final   Influenza B by PCR NEGATIVE NEGATIVE Final    Comment: (NOTE) The Xpert Xpress SARS-CoV-2/FLU/RSV plus assay is intended as an aid in the diagnosis of influenza from Nasopharyngeal swab specimens and should not be used as a sole basis for treatment. Nasal washings and aspirates are unacceptable for Xpert Xpress SARS-CoV-2/FLU/RSV testing.  Fact Sheet for Patients: EntrepreneurPulse.com.au  Fact Sheet for Healthcare Providers: IncredibleEmployment.be  This test is not yet approved or cleared by the [place] FDA and has been authorized for detection and/or diagnosis of SARS-CoV-2 by FDA under an Emergency Use Authorization (EUA). This EUA will remain in effect (meaning this test can be used) for the duration of the COVID-19 declaration under Section 564(b)(1) of the Act, 21 U.S.C. section 360bbb-3(b)(1), unless the authorization is terminated or revoked.  Performed at [facility], [address] [place], [place] [address]   Blood Culture (routine x 2)     Status: None (Preliminary result)   Collection Time: 06/12/21 11:30 AM   Specimen: BLOOD  Result Value Ref Range Status   Specimen Description   Final    BLOOD Blood Culture adequate volume Performed at [facility], [address] [place], [place] [address]    Special Requests   Final    BOTTLES DRAWN AEROBIC AND ANAEROBIC RIGHT ANTECUBITAL Performed at [facility], [address] [place], [place] [address]    Culture   Final    NO GROWTH 3 DAYS Performed at [facility], [place] [address] [place], [place] [address]    Report Status  PENDING  Incomplete  Blood Culture (routine x 2)     Status: None (Preliminary result)   Collection Time: 06/12/21 11:50 AM   Specimen: BLOOD  Result Value Ref Range Status   Specimen Description   Final    BLOOD Blood Culture adequate volume Performed at [facility], [address] [place], [place] [address]    Special Requests   Final    BOTTLES DRAWN AEROBIC AND ANAEROBIC BLOOD RIGHT FOREARM Performed at [facility], [address] [place], [place] [address]    Culture   Final    NO GROWTH 3 DAYS Performed at [facility], [place] [address] [place], [place] [address]    Report Status PENDING  Incomplete  Urine Culture     Status: None   Collection Time: 06/12/21  1:45 PM   Specimen: In/Out Cath Urine  Result Value Ref Range Status   Specimen Description   Final    IN/OUT CATH URINE Performed at [facility], [address] [place], [place] [address]    Special Requests   Final    Immunocompromised Performed at [facility], [address] [place], [place] [address]    Culture   Final    NO GROWTH Performed at [facility], [place] [address] [place], [place] [address]    Report Status 06/14/2021 FINAL  Final  Acid Fast Smear (AFB)     Status: None   Collection Time: 06/12/21  1:45 PM   Specimen: Sputum  Result Value Ref Range Status   AFB Specimen Processing Concentration  Final   Acid Fast Smear Negative  Final    Comment: (NOTE) Performed At: [facility] [place], [place] [id number] Rush Farmer MD [id number]    Source (AFB) SPUTUM  Final    Comment: Performed at [facility], [address] [place], [place] [address]    RADIOLOGY STUDIES/RESULTS: ECHOCARDIOGRAM COMPLETE  Result Date: 06/14/2021    ECHOCARDIOGRAM REPORT   Patient Name:   Shrey Garron Date of Exam: 06/14/2021 Medical Rec #:  [id number]    Height:       72.0 in Accession #:    [id number]   Weight:        162.0 lb Date of Birth:  Oct 07, 1970    BSA:          1.948 m Patient Age:    52 years     BP:           119/83 mmHg Patient Gender: M            HR:           91 bpm. Exam Location:  Inpatient Procedure: 2D Echo, Cardiac Doppler and Color Doppler Indications:    Acute respiratory distress  History:        Patient has no prior history of Echocardiogram examinations.  Sonographer:    Wenda Low Referring Phys: [place]  1. Left ventricular ejection fraction, by estimation, is 50 to 55%. The left ventricle has low normal function. The left ventricle has no regional wall motion abnormalities. There is mild left ventricular hypertrophy. Left ventricular diastolic parameters were normal.  2. Right ventricular systolic function is normal. The right ventricular size is normal. There is normal pulmonary artery systolic pressure.  3. A small pericardial effusion is present. There is no evidence of cardiac tamponade.  4. The mitral valve is grossly normal. Mild mitral valve regurgitation.  5. The aortic valve is grossly normal. Aortic valve regurgitation is not visualized. No aortic stenosis is present.  6. The inferior vena cava is normal in size with greater than 50% respiratory variability, suggesting right atrial pressure of 3 mmHg. Conclusion(s)/Recommendation(s): Otherwise normal echocardiogram, with minor abnormalities described in the report. FINDINGS  Left Ventricle: Left ventricular ejection fraction, by estimation, is 50 to 55%. The left ventricle has low normal function. The left ventricle has no regional wall motion abnormalities. The left ventricular internal cavity size was normal in size. There is mild left ventricular hypertrophy. Left ventricular diastolic parameters were normal. Right Ventricle: The right ventricular size is normal. No increase in right ventricular wall thickness. Right ventricular systolic function is normal. There is normal pulmonary artery systolic  pressure. The tricuspid regurgitant velocity is 2.59 m/s, and  with an assumed right atrial pressure of 3 mmHg, the estimated right ventricular systolic pressure is [id number] mmHg. Left Atrium: Left atrial size was normal in size.  Right Atrium: Right atrial size was normal in size. Pericardium: A small pericardial effusion is present. There is no evidence of cardiac tamponade. Mitral Valve: The mitral valve is grossly normal. Mild mitral valve regurgitation. MV peak gradient, 3.6 mmHg. The mean mitral valve gradient is 1.0 mmHg. Tricuspid Valve: The tricuspid valve is grossly normal. Tricuspid valve regurgitation is trivial. Aortic Valve: The aortic valve is grossly normal. Aortic valve regurgitation is not visualized. No aortic stenosis is present. Aortic valve mean gradient measures 8.0 mmHg. Aortic valve peak gradient measures 18.5 mmHg. Aortic valve area, by VTI measures  2.51 cm. Pulmonic Valve: The pulmonic valve was normal in structure. Pulmonic valve regurgitation is not visualized. Aorta: The aortic root is normal in size and structure. Venous: The inferior vena cava is normal in size with greater than 50% respiratory variability, suggesting right atrial pressure of 3 mmHg. IAS/Shunts: No atrial level shunt detected by color flow Doppler.  LEFT VENTRICLE PLAX 2D LVIDd:         4.80 cm   Diastology LVIDs:         3.90 cm   LV e' medial:   11.10 cm/s LV PW:         1.50 cm   LV E/e' medial: 8.5 LV IVS:        1.30 cm LVOT diam:     2.10 cm LV SV:         105 LV SV Index:   54 LVOT Area:     3.46 cm  RIGHT VENTRICLE RV Basal diam:  3.40 cm RV Mid diam:    3.00 cm RV S prime:     17.00 cm/s TAPSE (M-mode): 2.7 cm LEFT ATRIUM             Index        RIGHT ATRIUM           Index LA diam:        3.50 cm 1.80 cm/m   RA Area:     20.00 cm LA Vol (A2C):   69.4 ml 35.63 ml/m  RA Volume:   60.00 ml  30.80 ml/m LA Vol (A4C):   48.8 ml 25.05 ml/m LA Biplane Vol: 63.5 ml 32.60 ml/m  AORTIC VALVE AV Area (Vmax):     2.46 cm AV Area (Vmean):   2.59 cm AV Area (VTI):     2.51 cm AV Vmax:           215.00 cm/s AV Vmean:          128.000 cm/s AV VTI:            0.419 m AV Peak Grad:      18.5 mmHg AV Mean Grad:      8.0 mmHg LVOT Vmax:         153.00 cm/s LVOT Vmean:        95.700 cm/s LVOT VTI:          0.304 m LVOT/AV VTI ratio: 0.73  AORTA Ao Root diam: 3.20 cm MITRAL VALVE               TRICUSPID VALVE MV Area (PHT): 2.60 cm    TR Peak grad:   26.8 mmHg MV Area VTI:   3.69 cm    TR Vmax:        259.00 cm/s MV Peak grad:  3.6 mmHg MV Mean grad:  1.0 mmHg    SHUNTS MV Vmax:       0.96  m/s    Systemic VTI:  0.30 m MV Vmean:      51.7 cm/s   Systemic Diam: 2.10 cm MV Decel Time: 292 msec MV E velocity: 93.80 cm/s MV A velocity: 67.30 cm/s MV E/A ratio:  1.39 Placido Sou signed by Phineas Inches Signature Date/Time: 06/14/2021/3:59:39 PM    Final    US Abdomen Limited RUQ (LIVER/GB)  Result Date: 06/13/2021 CLINICAL DATA:  Transaminitis EXAM: ULTRASOUND ABDOMEN LIMITED RIGHT UPPER QUADRANT COMPARISON:  None. FINDINGS: Gallbladder: Gallbladder is contracted. No gallbladder wall thickening or pericholecystic fluid. Questionable 5 mm stone within the gallbladder fundus region. No sonographic Murphy's sign elicited during the exam. Common bile duct: Diameter: 8 mm Liver: No focal lesion identified. Within normal limits in parenchymal echogenicity. Portal vein is patent on color Doppler imaging with normal direction of blood flow towards the liver. Other: RIGHT pleural effusion. IMPRESSION: 1. Gallbladder is unremarkable. No evidence of cholecystitis. Questionable 5 mm gallstone, versus artifact. 2. CBD dilatation, measuring 8 mm diameter. Recommend correlation with liver function tests. If abnormal LFTs, when the patient is clinically stable and able to follow directions and hold their breath (preferably as an outpatient), further evaluation with dedicated MRCP should be considered. Otherwise, consider follow-up RIGHT  upper quadrant ultrasound in 3-6 months to ensure stability. Electronically Signed   By: Franki Cabot M.D.   On: 06/13/2021 15:41     LOS: 3 days   Oren Binet, MD  [facility]    To contact the attending provider between 7A-7P or the covering provider during after hours 7P-7A, please log into the web site [email or web address] and access using universal [place] password for that web site. If you do not have the password, please call the hospital operator.  06/15/2021, 12:31 PM

## 2021-06-15 NOTE — Progress Notes (Signed)
Pt sputum collected to send.

## 2021-06-15 NOTE — Progress Notes (Signed)
[facility]   Reason for visit: Follow up on lung abscess, HIV, retinal detachment  Interval History: right eye with no changes, no floaters or new concerns.  He is asking about discharging soon.  Tmax 101.3   Physical Exam: Constitutional:  Vitals:   06/15/21 0350 06/15/21 0522  BP:    Pulse: 90   Resp: 20   Temp: (!) 101.3 F (38.5 C) 99.1 F (37.3 C)  SpO2:     patient appears in NAD Respiratory: Normal respiratory effort; CTA B Cardiovascular: RRR GI: soft, nt, nd  Review of Systems: Gastrointestinal: negative for nausea and diarrhea Integument/breast: negative for rash  Lab Results  Component Value Date   WBC 8.1 06/15/2021   HGB 7.4 (L) 06/15/2021   HCT 23.9 (L) 06/15/2021   MCV 84.8 06/15/2021   PLT 556 (H) 06/15/2021    Lab Results  Component Value Date   CREATININE 0.68 06/15/2021   BUN 6 06/15/2021   NA 133 (L) 06/15/2021   K 3.3 (L) 06/15/2021   CL 99 06/15/2021   CO2 24 06/15/2021    Lab Results  Component Value Date   ALT 49 (H) 06/15/2021   AST 30 06/15/2021   ALKPHOS 79 06/15/2021     Microbiology: Recent Results (from the past 240 hour(s))  Expectorated Sputum Assessment w Gram Stain, Rflx to Resp Cult     Status: None   Collection Time: 06/12/21 11:04 AM   Specimen: Sputum  Result Value Ref Range Status   Specimen Description   Final    SPUTUM Performed at [facility], [address] [place], [place] [address]    Special Requests   Final    Immunocompromised Performed at [facility], [address] [place], [place] [address]    Sputum evaluation   Final    THIS SPECIMEN IS ACCEPTABLE FOR SPUTUM CULTURE Performed at [facility], [address] [place], [place] [address]    Report Status 06/12/2021 FINAL  Final  Culture, Respiratory w Gram Stain     Status: None   Collection Time: 06/12/21 11:04 AM   Specimen: SPU  Result Value Ref Range Status   Specimen  Description SPUTUM  Final   Special Requests Immunocompromised Reflexed from [id number]  Final   Gram Stain   Final    ABUNDANT WBC PRESENT,BOTH PMN AND MONONUCLEAR ABUNDANT GRAM POSITIVE COCCI FEW GRAM NEGATIVE RODS FEW GRAM POSITIVE RODS    Culture   Final    MODERATE Consistent with normal respiratory flora. Performed at [facility], [address], [place], [place] [address]    Report Status 06/15/2021 FINAL  Final  Resp Panel by RT-PCR (Flu A&B, Covid) Nasopharyngeal Swab     Status: None   Collection Time: 06/12/21 11:30 AM   Specimen: Nasopharyngeal Swab; Nasopharyngeal(NP) swabs in vial transport medium  Result Value Ref Range Status   SARS Coronavirus 2 by RT PCR NEGATIVE NEGATIVE Final    Comment: (NOTE) SARS-CoV-2 target nucleic acids are NOT DETECTED.  The SARS-CoV-2 RNA is generally detectable in upper respiratory specimens during the acute phase of infection. The lowest concentration of SARS-CoV-2 viral copies this assay can detect is 138 copies/mL. A negative result does not preclude SARS-Cov-2 infection and should not be used as the sole basis for treatment or other patient management decisions. A negative result may occur with  improper specimen collection/handling, submission of specimen other than nasopharyngeal  swab, presence of viral mutation(s) within the areas targeted by this assay, and inadequate number of viral copies(<138 copies/mL). A negative result must be combined with clinical observations, patient history, and epidemiological information. The expected result is Negative.  Fact Sheet for Patients:  BloggerCourse.com  Fact Sheet for Healthcare Providers:  SeriousBroker.it  This test is no t yet approved or cleared by the [place] FDA and  has been authorized for detection and/or diagnosis of SARS-CoV-2 by FDA under an Emergency Use Authorization (EUA). This EUA will remain  in effect  (meaning this test can be used) for the duration of the COVID-19 declaration under Section 564(b)(1) of the Act, 21 U.S.C.section 360bbb-3(b)(1), unless the authorization is terminated  or revoked sooner.       Influenza A by PCR NEGATIVE NEGATIVE Final   Influenza B by PCR NEGATIVE NEGATIVE Final    Comment: (NOTE) The Xpert Xpress SARS-CoV-2/FLU/RSV plus assay is intended as an aid in the diagnosis of influenza from Nasopharyngeal swab specimens and should not be used as a sole basis for treatment. Nasal washings and aspirates are unacceptable for Xpert Xpress SARS-CoV-2/FLU/RSV testing.  Fact Sheet for Patients: BloggerCourse.com  Fact Sheet for Healthcare Providers: SeriousBroker.it  This test is not yet approved or cleared by the [place] FDA and has been authorized for detection and/or diagnosis of SARS-CoV-2 by FDA under an Emergency Use Authorization (EUA). This EUA will remain in effect (meaning this test can be used) for the duration of the COVID-19 declaration under Section 564(b)(1) of the Act, 21 U.S.C. section 360bbb-3(b)(1), unless the authorization is terminated or revoked.  Performed at [facility], [address] [place], [place] [address]   Blood Culture (routine x 2)     Status: None (Preliminary result)   Collection Time: 06/12/21 11:30 AM   Specimen: BLOOD  Result Value Ref Range Status   Specimen Description   Final    BLOOD Blood Culture adequate volume Performed at [facility], [address] [place], [place] [address]    Special Requests   Final    BOTTLES DRAWN AEROBIC AND ANAEROBIC RIGHT ANTECUBITAL Performed at [facility], [address] [place], [place] [address]    Culture   Final    NO GROWTH 3 DAYS Performed at [facility], [address], [place], [place] [address]    Report Status PENDING  Incomplete  Blood Culture (routine x 2)      Status: None (Preliminary result)   Collection Time: 06/12/21 11:50 AM   Specimen: BLOOD  Result Value Ref Range Status   Specimen Description   Final    BLOOD Blood Culture adequate volume Performed at [facility], [address] [place], [place] [address]    Special Requests   Final    BOTTLES DRAWN AEROBIC AND ANAEROBIC BLOOD RIGHT FOREARM Performed at [facility], [address] [place], [place] [address]    Culture   Final    NO GROWTH 3 DAYS Performed at [facility], [address] [place], [place] [address]    Report Status PENDING  Incomplete  Urine Culture     Status: None   Collection Time: 06/12/21  1:45 PM   Specimen: In/Out Cath Urine  Result Value Ref Range Status   Specimen Description   Final    IN/OUT CATH URINE Performed at [facility], [address]  [place], [place] [address]    Special Requests   Final    Immunocompromised Performed at [facility], [address] [place], [place] [address]    Culture   Final    NO GROWTH Performed at [facility], [address] [place], [place] [address]    Report Status 06/14/2021 FINAL  Final  Acid Fast Smear (AFB)     Status: None   Collection Time: 06/12/21  1:45 PM   Specimen: Sputum  Result Value Ref Range Status   AFB Specimen Processing Concentration  Final   Acid Fast Smear Negative  Final    Comment: (NOTE) Performed At: [facility] [address] [place], [place] [id number] Jolene Schimke MD DP:[id number]    Source (AFB) SPUTUM  Final    Comment: Performed at [facility], [address] [place], [place] [address]    Impression/Plan:  1. 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  No new concerns in right eye and on acyclovir.  He will need follow up with the retinal specialist per Dr. Laruth Bouchard note to check on his good eye to se if there are any signs after some immune reconstitution.    2.  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  Will also assure he gets a supply for discharge.  He reports he has an appointment with infectious disease in [facility] in April ([facility]).  I have not verified this.    3. Opportunistic infection prophylaxis - will put him on bactrim prophylaxis  4. Necrotic pneumonia with Lung abscess - putrid breath, findings c/w abscess.  On amp/sulbactam. He will need about 4 weeks of Augmentin at discharge.  Sputum culture unhelpful with normal flora

## 2021-06-15 NOTE — Progress Notes (Signed)
°  Transition of Care [facility]) Screening Note   Patient Details  Name: Mason Moran Date of Birth: 08/09/1970   [facility]) CM/SW Contact:    Harriet Masson, RN Phone Number: 06/15/2021, 8:30 AM    Transition of Care Department [facility]) has reviewed patient. Patient has no insurance or PCP. We will continue to monitor patient advancement through interdisciplinary progression rounds. If new patient transition needs arise, please place a TOC consult.

## 2021-06-15 NOTE — Progress Notes (Signed)
Primary RN notified of  sputum collection needed in the morning. 06/16/21. If sample cannot be collected RTT needs to be contacted.

## 2021-06-16 ENCOUNTER — Other Ambulatory Visit (HOSPITAL_COMMUNITY): Payer: Self-pay

## 2021-06-16 LAB — CBC
HCT: 22.4 % — ABNORMAL LOW (ref 39.0–52.0)
Hemoglobin: 7.3 g/dL — ABNORMAL LOW (ref 13.0–17.0)
MCH: 27 pg (ref 26.0–34.0)
MCHC: 32.6 g/dL (ref 30.0–36.0)
MCV: 83 fL (ref 80.0–100.0)
Platelets: 528 10*3/uL — ABNORMAL HIGH (ref 150–400)
RBC: 2.7 MIL/uL — ABNORMAL LOW (ref 4.22–5.81)
RDW: 14 % (ref 11.5–15.5)
WBC: 6.4 10*3/uL (ref 4.0–10.5)
nRBC: 0 % (ref 0.0–0.2)

## 2021-06-16 LAB — BASIC METABOLIC PANEL
Anion gap: 6 (ref 5–15)
BUN: 5 mg/dL — ABNORMAL LOW (ref 6–20)
CO2: 25 mmol/L (ref 22–32)
Calcium: 7.3 mg/dL — ABNORMAL LOW (ref 8.9–10.3)
Chloride: 103 mmol/L (ref 98–111)
Creatinine, Ser: 0.62 mg/dL (ref 0.61–1.24)
GFR, Estimated: >60 mL/min (ref >60)
Glucose, Bld: 102 mg/dL — ABNORMAL HIGH (ref 70–99)
Potassium: 4 mmol/L (ref 3.5–5.1)
Sodium: 134 mmol/L — ABNORMAL LOW (ref 135–145)

## 2021-06-16 LAB — ACID FAST SMEAR (AFB, MYCOBACTERIA): Acid Fast Smear: NEGATIVE

## 2021-06-16 LAB — MAGNESIUM: Magnesium: 2 mg/dL (ref 1.7–2.4)

## 2021-06-16 MED ORDER — BICTEGRAVIR-EMTRICITAB-TENOFOV 50-200-25 MG PO TABS
1.0000 | ORAL_TABLET | Freq: Every day | ORAL | 3 refills | Status: DC
  Filled 2021-06-16: qty 30, 30d supply, fill #0

## 2021-06-16 MED ORDER — SULFAMETHOXAZOLE-TRIMETHOPRIM 800-160 MG PO TABS
1.0000 | ORAL_TABLET | Freq: Every day | ORAL | 3 refills | Status: DC
  Filled 2021-06-16: qty 30, 30d supply, fill #0

## 2021-06-16 MED ORDER — SULFAMETHOXAZOLE-TRIMETHOPRIM 800-160 MG PO TABS: 1.0000 | ORAL_TABLET | Freq: Every day | ORAL | 3 refills | Status: DC

## 2021-06-16 MED ORDER — BICTEGRAVIR-EMTRICITAB-TENOFOV 50-200-25 MG PO TABS: 1.0000 | ORAL_TABLET | Freq: Every day | ORAL | 3 refills | Status: DC

## 2021-06-16 MED ORDER — VALACYCLOVIR HCL 500 MG PO TABS
500.0000 mg | ORAL_TABLET | Freq: Two times a day (BID) | ORAL | Status: DC
  Administered 2021-06-16 – 2021-06-17 (×2): 500 mg via ORAL
  Filled 2021-06-16 (×2): qty 1

## 2021-06-16 MED ORDER — AMOXICILLIN-POT CLAVULANATE 875-125 MG PO TABS
1.0000 | ORAL_TABLET | Freq: Two times a day (BID) | ORAL | 0 refills | Status: DC
  Filled 2021-06-16: qty 56, 28d supply, fill #0

## 2021-06-16 MED ORDER — AMOXICILLIN-POT CLAVULANATE 875-125 MG PO TABS: 1.0000 | ORAL_TABLET | Freq: Two times a day (BID) | ORAL | 0 refills | Status: DC

## 2021-06-16 MED ORDER — VALACYCLOVIR HCL 500 MG PO TABS: 500.0000 mg | ORAL_TABLET | Freq: Two times a day (BID) | ORAL | 3 refills | Status: DC

## 2021-06-16 MED ORDER — VALACYCLOVIR HCL 500 MG PO TABS
500.0000 mg | ORAL_TABLET | Freq: Two times a day (BID) | ORAL | 3 refills | Status: DC
  Filled 2021-06-16: qty 60, 30d supply, fill #0

## 2021-06-16 NOTE — TOC Progression Note (Addendum)
Transition of Care [facility]) - Progression Note    Patient Details  Name: Mason Moran MRN: [id number] Date of Birth: Sep 23, 1970  Transition [facility] Care [facility]) CM/SW Contact  Lawerance Sabal, RN Phone Number: 06/16/2021, 2:19 PM  Clinical Narrative:    Entered patient into [facility] for [facility], please send meds through [facility] pharmacy. ID clinic will provide with Biktarvy. Patient would like to switch his ID provider to Dr Luciana Axe, Dr Luciana Axe happy to assist and will make follow up appointment. Made for 2/23/ at 3:00 Patient states that he has transportation help fromhis son and his sister. Informed that [facility] is no longer being used and he will be responsible for arranging his transportation to his follow up. He states that between his sister and his son he will have help.  Patient has follow up in [facility] 07/20/21 at 2:30  He states that he will be staying at his sister's house at [address] [facility] [place] after DC     Expected Discharge Plan: Home/Self Care Barriers to Discharge: Continued Medical Work up  Expected Discharge Plan and Services Expected Discharge Plan: Home/Self Care   Discharge Planning Services: [facility], Follow-up appt scheduled   Living arrangements for the past 2 months: Single Family Home                                       Social Determinants of Health (SDOH) Interventions    Readmission Risk Interventions No flowsheet data found.

## 2021-06-16 NOTE — Progress Notes (Addendum)
PROGRESS NOTE        PATIENT DETAILS Name: Mason Moran Age: 51 y.o. Sex: male Date of Birth: 03-20-1971 Admit Date: 06/12/2021 Admitting Physician Emeterio Reeve, DO PCP:Pcp, No  Brief Summary: Patient is a 51 y.o.  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  Significant Hospital events: 2/3>> admit to [facility] PNA/left eye visual loss.  Significant imaging studies: 2/3>> CT head: No acute intracranial abnormality 2/3>> CXR: Large right lower lobe PNA 2/4>> CT chest: 7.2 cm lung abscess in right lower lobe.  Significant microbiology data: 2/3>> COVID/flu PCR: Negative 2/3>> blood culture: No growth 2/3>> urine culture: Pending 2/3>> sputum AFB smear: Negative 2/3>> sputum culture: Normal respiratory flora 2/6>> sputum AFB smear: Negative 2/6>>CMV DNR>>pending 2/7>> sputum AFB smear: Pending  Procedures: None  Consults:  ID Ophthalmology  Subjective: No cough shortness of breath-lying comfortably in bed.  Gritty sensation in mouth has resolved after switching antibiotics to Unasyn.  Objective: Vitals: Blood pressure 132/73, pulse 83, temperature 98.9 F (37.2 C), temperature source Oral, resp. rate 18, height 6' (1.829 m), weight 73.5 kg, SpO2 97 %.   Exam: Gen Exam:Alert awake-not in any distress HEENT:atraumatic, normocephalic Chest: B/L clear to auscultation anteriorly CVS:S1S2 regular Abdomen:soft non tender, non distended Extremities:no edema Neurology: Non focal Skin: no rash   Pertinent Labs/Radiology: CBC Latest Ref Rng & Units 06/16/2021 06/15/2021 06/14/2021  WBC 4.0 - 10.5 K/uL 6.4 8.1 8.7  Hemoglobin 13.0 - 17.0 g/dL 7.3(L) 7.4(L) 8.3(L)  Hematocrit 39.0 - 52.0 % 22.4(L) 23.9(L) 25.9(L)  Platelets 150 - 400 K/uL 528(H) 556(H) 543(H)    Lab Results  Component Value Date   NA  134 (L) 06/16/2021   K 4.0 06/16/2021   CL 103 06/16/2021   CO2 25 06/16/2021      Assessment/Plan: * Sepsis due to RLL lung abscess/necrotizing pneumonia- (present on admission) Sepsis physiology has resolved-continues to have intermittent fever-which is to be expected given advanced HIV-and multiple ongoing infections-has been restarted on antiretroviral so could have high risk as well.  Multiple differentials for lung abscess -CMV, recurrent Mycobacterium kansasii infection-and usual bacterial organisms.  Sputum cultures negative-AFB smears negative so far.  Discussed with infectious disease-continue IV Unasyn for now-planning to switch to Augmentin x4 weeks on discharge.    Acute respiratory failure with hypoxia ([place])- (present on admission) Hypoxia seems to have resolved-on room air.  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  Right eye appears unchanged overnight.  Anemia- (present on admission) Due to underlying chronic disease/HIV-transfused 1 unit of PRBC on 2/4-hemoglobin stable this morning.  No evidence of bleeding.  Transfuse if Hb<7  HIV/AIDS  (CD4 count of 37 on 2/3)- (present on admission) Recently incarcerated-off antiretrovirals for several years.  ID consulted-has been started on Biktarvy.  On Bactrim for PJP prophylaxis.  Elevated platelet count Likely reactive thrombocytosis in the setting of HIV/AIDS.  Transaminitis Unclear etiology-could be related to underlying infectious issue affecting eyes/lung-acute hepatitis serology was negative.  RUQ ultrasound without any acute liver issues.  Thankfully transaminitis slowly improving.  History of TB (tuberculosis) History of Mycobacterium kansasii in 2018-Per chart review and Care Everywhere-this  was apparently treated.  Remains on  airborne isolation-awaiting AFB smears x3 to be negative before discontinuing isolation.  Hyponatremia Mild hyponatremia on admission likely due to hypovolemia-responded to IVF.  Follow electrolytes closely.  Hypokalemia Repleted  Hypocalcemia Mild borderline hypercalcemia due to hypoalbuminemia-follow closely.  Vitamin B12 deficiency- (present on admission) Borderline vitamin B12 levels-continue supplementation.  BMI: Estimated body mass index is 21.97 kg/m as calculated from the following:   Height as of this encounter: 6' (1.829 m).   Weight as of this encounter: 73.5 kg.   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    Planned Discharge Destination:Home   Diet: Diet Order             Diet regular Room service appropriate? Yes; Fluid consistency: Thin  Diet effective now                     Antimicrobial agents: Anti-infectives (From admission, onward)    Start     Dose/Rate Route Frequency Ordered Stop   06/17/21 0000  sulfamethoxazole-trimethoprim (BACTRIM DS) 800-160 MG tablet  Status:  Discontinued        1 tablet Oral Daily 06/16/21 1052 06/16/21    06/17/21 0000  sulfamethoxazole-trimethoprim (BACTRIM DS) 800-160 MG tablet        1 tablet Oral Daily 06/16/21 1113     06/16/21 2200  valACYclovir (VALTREX) tablet 500 mg        500 mg Oral 2 times daily 06/16/21 0943     06/16/21 0000  amoxicillin-clavulanate (AUGMENTIN) 875-125 MG tablet  Status:  Discontinued        1 tablet Oral 2 times daily 06/16/21 1052 06/16/21    06/16/21 0000   bictegravir-emtricitabine-tenofovir AF (BIKTARVY) 50-200-25 MG TABS tablet  Status:  Discontinued        1 tablet Oral Daily 06/16/21 1052 06/16/21    06/16/21 0000  valACYclovir (VALTREX) 500 MG tablet  Status:  Discontinued        500 mg Oral 2 times daily 06/16/21 1052 06/16/21    06/16/21 0000  bictegravir-emtricitabine-tenofovir AF (BIKTARVY) 50-200-25 MG TABS tablet        1 tablet Oral Daily 06/16/21 1113     06/16/21 0000  valACYclovir (VALTREX) 500 MG tablet        500 mg Oral 2 times daily 06/16/21 1113 10/14/21 2359   06/16/21 0000  amoxicillin-clavulanate (AUGMENTIN) 875-125 MG tablet        1 tablet Oral 2 times daily 06/16/21 1113 07/14/21 2359   06/15/21 1800  valGANciclovir (VALCYTE) 450 MG tablet TABS 900 mg  Status:  Discontinued        900 mg Oral 2 times daily 06/15/21 1448 06/15/21 1519   06/15/21 1615  valGANciclovir (VALCYTE) 450 MG tablet TABS 900 mg  Status:  Discontinued        900 mg Oral Daily 06/15/21 1519 06/16/21 0943   06/15/21 1545  bictegravir-emtricitabine-tenofovir AF (BIKTARVY) 50-200-25 MG per tablet 1 tablet        1 tablet Oral Daily 06/15/21 1448     06/15/21 1315  sulfamethoxazole-trimethoprim (BACTRIM DS) 800-160 MG per tablet 1 tablet  1 tablet Oral Daily 06/15/21 1219     06/15/21 1000  Ampicillin-Sulbactam (UNASYN) 3 g in sodium chloride 0.9 % 100 mL IVPB        3 g 200 mL/hr over 30 Minutes Intravenous Every 6 hours 06/15/21 0956     06/14/21 1200  acyclovir (ZOVIRAX) 735 mg in dextrose 5 % 150 mL IVPB  Status:  Discontinued        10 mg/kg  73.5 kg 164.7 mL/hr over 60 Minutes Intravenous Every 8 hours 06/13/21 1442 06/14/21 0823   06/14/21 0930  acyclovir (ZOVIRAX) 735 mg in dextrose 5 % 150 mL IVPB  Status:  Discontinued        10 mg/kg  73.5 kg 164.7 mL/hr over 60 Minutes Intravenous Every 8 hours 06/14/21 0823 06/15/21 1448   06/14/21 0415  acyclovir (ZOVIRAX) 735 mg in dextrose 5 % 150 mL IVPB        10 mg/kg  73.5 kg 164.7  mL/hr over 60 Minutes Intravenous  Once 06/14/21 0315 06/14/21 0539   06/14/21 0000  acyclovir (ZOVIRAX) 735 mg in dextrose 5 % 150 mL IVPB  Status:  Discontinued        10 mg/kg  73.5 kg 164.7 mL/hr over 60 Minutes Intravenous Every 8 hours 06/13/21 1402 06/13/21 1442   06/14/21 0000  ganciclovir (CYTOVENE) 370 mg in sodium chloride 0.9 % 100 mL IVPB  Status:  Discontinued        5 mg/kg  73.5 kg 100 mL/hr over 60 Minutes Intravenous Every 12 hours 06/13/21 1442 06/14/21 0315   06/13/21 1500  metroNIDAZOLE (FLAGYL) tablet 500 mg  Status:  Discontinued        500 mg Oral Every 12 hours 06/13/21 1411 06/15/21 0943   06/13/21 1500  cefTRIAXone (ROCEPHIN) 2 g in sodium chloride 0.9 % 100 mL IVPB  Status:  Discontinued        2 g 200 mL/hr over 30 Minutes Intravenous Every 24 hours 06/13/21 1411 06/15/21 0943   06/13/21 0930  ganciclovir (CYTOVENE) 370 mg in sodium chloride 0.9 % 100 mL IVPB  Status:  Discontinued        5 mg/kg  73.5 kg 100 mL/hr over 60 Minutes Intravenous Every 12 hours 06/13/21 0830 06/13/21 1330   06/12/21 2200  vancomycin (VANCOREADY) IVPB 1250 mg/250 mL  Status:  Discontinued        1,250 mg 166.7 mL/hr over 90 Minutes Intravenous Every 12 hours 06/12/21 1254 06/13/21 1411   06/12/21 2100  acyclovir (ZOVIRAX) 745 mg in dextrose 5 % 150 mL IVPB  Status:  Discontinued        10 mg/kg  74.4 kg 164.9 mL/hr over 60 Minutes Intravenous Every 8 hours 06/12/21 2003 06/13/21 0809   06/12/21 2030  acyclovir (ZOVIRAX) tablet 800 mg  Status:  Discontinued        800 mg Oral 3 times daily 06/12/21 1946 06/12/21 1952   06/12/21 2000  ceFEPIme (MAXIPIME) 2 g in sodium chloride 0.9 % 100 mL IVPB  Status:  Discontinued        2 g 200 mL/hr over 30 Minutes Intravenous Every 8 hours 06/12/21 1254 06/13/21 1411   06/12/21 1100  vancomycin (VANCOCIN) IVPB 1000 mg/200 mL premix        1,000 mg 200 mL/hr over 60 Minutes Intravenous  Once 06/12/21 1046 06/12/21 1347   06/12/21 1100   ceFEPIme (MAXIPIME) 2 g in sodium chloride 0.9 % 100 mL IVPB  2 g 200 mL/hr over 30 Minutes Intravenous  Once 06/12/21 1046 06/12/21 1347        MEDICATIONS: Scheduled Meds:  bictegravir-emtricitabine-tenofovir AF  1 tablet Oral Daily   calcium carbonate  1,000 mg of elemental calcium Oral QHS   cyanocobalamin  1,000 mcg Subcutaneous Daily   enoxaparin (LOVENOX) injection  40 mg Subcutaneous Q24H   feeding supplement  237 mL Oral BID BM   multivitamin with minerals  1 tablet Oral QHS   sulfamethoxazole-trimethoprim  1 tablet Oral Daily   valACYclovir  500 mg Oral BID   Continuous Infusions:  sodium chloride Stopped (06/12/21 1342)   ampicillin-sulbactam (UNASYN) IV 3 g (06/16/21 1511)   PRN Meds:.sodium chloride, guaiFENesin, ibuprofen   I have personally reviewed following labs and imaging studies  LABORATORY DATA: CBC: Recent Labs  Lab 06/12/21 1130 06/12/21 2000 06/13/21 0143 06/13/21 0931 06/14/21 0201 06/15/21 0343 06/16/21 0120  WBC 12.6* 10.2 13.8*  --  8.7 8.1 6.4  NEUTROABS 10.9*  --   --   --   --   --   --   HGB 7.7* 6.3* 6.9* 7.0* 8.3* 7.4* 7.3*  HCT 24.6* 20.6* 22.1* 21.8* 25.9* 23.9* 22.4*  MCV 82.8 84.4 84.4  --  83.5 84.8 83.0  PLT 655* 508* 536*  --  543* 556* 528*    Basic Metabolic Panel: Recent Labs  Lab 06/12/21 1130 06/12/21 2000 06/13/21 0143 06/14/21 0201 06/15/21 0343 06/16/21 0120  NA 130*  --  135 136 133* 134*  K 3.3*  --  3.5 3.0* 3.3* 4.0  CL 93*  --  100 100 99 103  CO2 27  --  27 26 24 25   GLUCOSE 111*  --  114* 95 95 102*  BUN 8  --  6 5* 6 5*  CREATININE 0.72 0.62 0.58* 0.68 0.68 0.62  CALCIUM 7.9*  --  7.7*   7.5* 7.7* 7.3* 7.3*  MG  --   --   --   --  1.7 2.0    GFR: Estimated Creatinine Clearance: 113.6 mL/min (by C-G formula based on SCr of 0.62 mg/dL).  Liver Function Tests: Recent Labs  Lab 06/12/21 1130 06/14/21 0201 06/15/21 0343  AST 116* 40 30  ALT 125* 63* 49*  ALKPHOS 134* 81 79  BILITOT  0.6 0.3 0.4  PROT 7.5 6.1* 6.0*  ALBUMIN 2.0* 1.5* <1.5*   No results for input(s): LIPASE, AMYLASE in the last 168 hours. No results for input(s): AMMONIA in the last 168 hours.  Coagulation Profile: Recent Labs  Lab 06/12/21 1130  INR 1.3*    Cardiac Enzymes: No results for input(s): CKTOTAL, CKMB, CKMBINDEX, TROPONINI in the last 168 hours.  BNP (last 3 results) No results for input(s): PROBNP in the last 8760 hours.  Lipid Profile: No results for input(s): CHOL, HDL, LDLCALC, TRIG, CHOLHDL, LDLDIRECT in the last 72 hours.  Thyroid Function Tests: No results for input(s): TSH, T4TOTAL, FREET4, T3FREE, THYROIDAB in the last 72 hours.  Anemia Panel: No results for input(s): VITAMINB12, FOLATE, FERRITIN, TIBC, IRON, RETICCTPCT in the last 72 hours.   Urine analysis:    Component Value Date/Time   COLORURINE AMBER (A) 06/12/2021 1345   APPEARANCEUR CLEAR 06/12/2021 1345   LABSPEC 1.025 06/12/2021 1345   PHURINE 6.0 06/12/2021 1345   GLUCOSEU NEGATIVE 06/12/2021 1345   HGBUR NEGATIVE 06/12/2021 1345   BILIRUBINUR NEGATIVE 06/12/2021 1345   KETONESUR NEGATIVE 06/12/2021 1345   PROTEINUR 30 (A) 06/12/2021 1345  NITRITE NEGATIVE 06/12/2021 1345   LEUKOCYTESUR NEGATIVE 06/12/2021 1345    Sepsis Labs: Lactic Acid, Venous    Component Value Date/Time   LATICACIDVEN 0.9 06/12/2021 1130    MICROBIOLOGY: Recent Results (from the past 240 hour(s))  Expectorated Sputum Assessment w Gram Stain, Rflx to Resp Cult     Status: None   Collection Time: 06/12/21 11:04 AM   Specimen: Sputum  Result Value Ref Range Status   Specimen Description   Final    SPUTUM Performed at [facility], [address] [place], [place] [address]    Special Requests   Final    Immunocompromised Performed at [facility], [address] [place], [place] [address]    Sputum evaluation   Final    THIS SPECIMEN IS ACCEPTABLE FOR SPUTUM CULTURE Performed at [facility], [place] [address] [place], [place] [address]    Report Status 06/12/2021 FINAL  Final  Culture, Respiratory w Gram Stain     Status: None   Collection Time: 06/12/21 11:04 AM   Specimen: SPU  Result Value Ref Range Status   Specimen Description SPUTUM  Final   Special Requests Immunocompromised Reflexed from [id number]  Final   Gram Stain   Final    ABUNDANT WBC PRESENT,BOTH PMN AND MONONUCLEAR ABUNDANT GRAM POSITIVE COCCI FEW GRAM NEGATIVE RODS FEW GRAM POSITIVE RODS    Culture   Final    MODERATE Consistent with normal respiratory flora. Performed at [facility], [place] [address], [place], [place] [address]    Report Status 06/15/2021 FINAL  Final  Resp Panel by RT-PCR (Flu A&B, Covid) Nasopharyngeal Swab     Status: None   Collection Time: 06/12/21 11:30 AM   Specimen: Nasopharyngeal Swab; Nasopharyngeal(NP) swabs in vial transport medium  Result Value Ref Range Status   SARS Coronavirus 2 by RT PCR NEGATIVE NEGATIVE Final    Comment: (NOTE) SARS-CoV-2 target nucleic acids are NOT DETECTED.  The SARS-CoV-2 RNA is generally detectable in upper respiratory specimens during the acute phase of infection. The lowest concentration of SARS-CoV-2 viral copies this assay can detect is 138 copies/mL. A negative result does not preclude SARS-Cov-2 infection and should not be used as the sole basis for treatment or other patient management decisions. A negative result may occur with  improper specimen collection/handling, submission of specimen other than nasopharyngeal swab, presence of viral mutation(s) within the areas targeted by this assay, and inadequate number of viral copies(<138 copies/mL). A negative result must be combined with clinical observations, patient history, and epidemiological information. The expected result is Negative.  Fact Sheet for Patients:  EntrepreneurPulse.com.au  Fact Sheet for Healthcare Providers:   IncredibleEmployment.be  This test is no t yet approved or cleared by the [place] FDA and  has been authorized for detection and/or diagnosis of SARS-CoV-2 by FDA under an Emergency Use Authorization (EUA). This EUA will remain  in effect (meaning this test can be used) for the duration of the COVID-19 declaration under Section 564(b)(1) of the Act, 21 U.S.C.section 360bbb-3(b)(1), unless the authorization is terminated  or revoked sooner.       Influenza A by PCR NEGATIVE NEGATIVE Final   Influenza B by PCR NEGATIVE NEGATIVE Final    Comment: (NOTE) The Xpert Xpress SARS-CoV-2/FLU/RSV plus assay is intended as an aid in the diagnosis of influenza from Nasopharyngeal swab specimens and should not be used as a sole basis for treatment. Nasal washings and  aspirates are unacceptable for Xpert Xpress SARS-CoV-2/FLU/RSV testing.  Fact Sheet for Patients: EntrepreneurPulse.com.au  Fact Sheet for Healthcare Providers: IncredibleEmployment.be  This test is not yet approved or cleared by the [place] FDA and has been authorized for detection and/or diagnosis of SARS-CoV-2 by FDA under an Emergency Use Authorization (EUA). This EUA will remain in effect (meaning this test can be used) for the duration of the COVID-19 declaration under Section 564(b)(1) of the Act, 21 U.S.C. section 360bbb-3(b)(1), unless the authorization is terminated or revoked.  Performed at [facility], [address] [place], [place] [address]   Blood Culture (routine x 2)     Status: None (Preliminary result)   Collection Time: 06/12/21 11:30 AM   Specimen: BLOOD  Result Value Ref Range Status   Specimen Description   Final    BLOOD Blood Culture adequate volume Performed at [facility], [address] [place], [place] [address]    Special Requests   Final    BOTTLES DRAWN AEROBIC AND ANAEROBIC RIGHT  ANTECUBITAL Performed at [facility], [address] [place], [place] [address]    Culture   Final    NO GROWTH 4 DAYS Performed at [facility], [place] [address] [place] [address], [place], [place] [address]    Report Status PENDING  Incomplete  Blood Culture (routine x 2)     Status: None (Preliminary result)   Collection Time: 06/12/21 11:50 AM   Specimen: BLOOD  Result Value Ref Range Status   Specimen Description   Final    BLOOD Blood Culture adequate volume Performed at [facility], [address] [place], [place] [address]    Special Requests   Final    BOTTLES DRAWN AEROBIC AND ANAEROBIC BLOOD RIGHT FOREARM Performed at [facility], [address], [place], [place] [address]    Culture   Final    NO GROWTH 4 DAYS Performed at [facility], [place] [address] [place], [place] [address]    Report Status PENDING  Incomplete  Urine Culture     Status: None   Collection Time: 06/12/21  1:45 PM   Specimen: In/Out Cath Urine  Result Value Ref Range Status   Specimen Description   Final    IN/OUT CATH URINE Performed at [facility], [address] [place], [place] [address]    Special Requests   Final    Immunocompromised Performed at [facility], [address] [place], [place] [address]    Culture   Final    NO GROWTH Performed at [facility], [place] [address] [place], [place] [address]    Report Status 06/14/2021 FINAL  Final  Acid Fast Smear (AFB)     Status: None   Collection Time: 06/12/21  1:45 PM   Specimen: Sputum  Result Value Ref Range Status   AFB Specimen Processing Concentration  Final   Acid Fast Smear Negative  Final    Comment: (NOTE) Performed At: [facility] [address] [place], [place] [id number] Rush Farmer MD [id number]    Source (AFB) SPUTUM  Final    Comment: Performed at [facility], [address] [place], [place] [address]  MTB-RIF NAA with AFB  Culture, sputum     Status: None (Preliminary result)   Collection Time: 06/12/21  1:45 PM   Specimen: Sputum  Result Value Ref  Range Status   Myco tuberculosis Complex PENDING  Incomplete   Rifampin PENDING  Incomplete   AFB Specimen Processing Concentration  Final    Comment: (NOTE) Performed At: [facility] [place], [place] [id number] Rush Farmer MD [id number]    Acid Fast Culture PENDING  Incomplete   Source (MTB RIF) SPUTUM  Final    Comment: Performed at [facility], [place] [address] [place], [place] [address]  Acid Fast Smear (AFB)     Status: None   Collection Time: 06/15/21 12:21 AM   Specimen: Expectorated Sputum  Result Value Ref Range Status   AFB Specimen Processing Concentration  Final   Acid Fast Smear Negative  Final    Comment: (NOTE) Performed At: [facility] [place] [facility] [place], [place] [id number] Rush Farmer MD [id number]    Source (AFB) SPUTUM  Final    Comment: Performed at [facility], [place] [address] [place], [place] [address]    RADIOLOGY STUDIES/RESULTS: ECHOCARDIOGRAM COMPLETE  Result Date: 06/14/2021    ECHOCARDIOGRAM REPORT   Patient Name:   Aiyden Luera Date of Exam: 06/14/2021 Medical Rec #:  [id number]    Height:       72.0 in Accession #:    [id number]   Weight:       162.0 lb Date of Birth:  1971/02/11    BSA:          1.948 m Patient Age:    20 years     BP:           119/83 mmHg Patient Gender: M            HR:           91 bpm. Exam Location:  Inpatient Procedure: 2D Echo, Cardiac Doppler and Color Doppler Indications:    Acute respiratory distress  History:        Patient has no prior history of Echocardiogram examinations.  Sonographer:    Wenda Low Referring Phys: [place]  1. Left ventricular ejection fraction, by estimation, is 50 to 55%. The left ventricle has low normal function. The left ventricle has no regional wall motion abnormalities. There is  mild left ventricular hypertrophy. Left ventricular diastolic parameters were normal.  2. Right ventricular systolic function is normal. The right ventricular size is normal. There is normal pulmonary artery systolic pressure.  3. A small pericardial effusion is present. There is no evidence of cardiac tamponade.  4. The mitral valve is grossly normal. Mild mitral valve regurgitation.  5. The aortic valve is grossly normal. Aortic valve regurgitation is not visualized. No aortic stenosis is present.  6. The inferior vena cava is normal in size with greater than 50% respiratory variability, suggesting right atrial pressure of 3 mmHg. Conclusion(s)/Recommendation(s): Otherwise normal echocardiogram, with minor abnormalities described in the report. FINDINGS  Left Ventricle: Left ventricular ejection fraction, by estimation, is 50 to 55%. The left ventricle has low normal function. The left ventricle has no regional wall motion abnormalities. The left ventricular internal cavity size was normal in size. There is mild left ventricular hypertrophy. Left ventricular diastolic parameters were normal. Right Ventricle: The right ventricular size is normal. No increase in right ventricular wall thickness. Right ventricular systolic function is normal. There is normal pulmonary artery systolic pressure. The tricuspid regurgitant velocity is 2.59 m/s, and  with an assumed right atrial pressure of 3 mmHg, the estimated right ventricular systolic pressure is [id number] mmHg.  Left Atrium: Left atrial size was normal in size. Right Atrium: Right atrial size was normal in size. Pericardium: A small pericardial effusion is present. There is no evidence of cardiac tamponade. Mitral Valve: The mitral valve is grossly normal. Mild mitral valve regurgitation. MV peak gradient, 3.6 mmHg. The mean mitral valve gradient is 1.0 mmHg. Tricuspid Valve: The tricuspid valve is grossly normal. Tricuspid valve regurgitation is trivial. Aortic Valve: The  aortic valve is grossly normal. Aortic valve regurgitation is not visualized. No aortic stenosis is present. Aortic valve mean gradient measures 8.0 mmHg. Aortic valve peak gradient measures 18.5 mmHg. Aortic valve area, by VTI measures  2.51 cm. Pulmonic Valve: The pulmonic valve was normal in structure. Pulmonic valve regurgitation is not visualized. Aorta: The aortic root is normal in size and structure. Venous: The inferior vena cava is normal in size with greater than 50% respiratory variability, suggesting right atrial pressure of 3 mmHg. IAS/Shunts: No atrial level shunt detected by color flow Doppler.  LEFT VENTRICLE PLAX 2D LVIDd:         4.80 cm   Diastology LVIDs:         3.90 cm   LV e' medial:   11.10 cm/s LV PW:         1.50 cm   LV E/e' medial: 8.5 LV IVS:        1.30 cm LVOT diam:     2.10 cm LV SV:         105 LV SV Index:   54 LVOT Area:     3.46 cm  RIGHT VENTRICLE RV Basal diam:  3.40 cm RV Mid diam:    3.00 cm RV S prime:     17.00 cm/s TAPSE (M-mode): 2.7 cm LEFT ATRIUM             Index        RIGHT ATRIUM           Index LA diam:        3.50 cm 1.80 cm/m   RA Area:     20.00 cm LA Vol (A2C):   69.4 ml 35.63 ml/m  RA Volume:   60.00 ml  30.80 ml/m LA Vol (A4C):   48.8 ml 25.05 ml/m LA Biplane Vol: 63.5 ml 32.60 ml/m  AORTIC VALVE AV Area (Vmax):    2.46 cm AV Area (Vmean):   2.59 cm AV Area (VTI):     2.51 cm AV Vmax:           215.00 cm/s AV Vmean:          128.000 cm/s AV VTI:            0.419 m AV Peak Grad:      18.5 mmHg AV Mean Grad:      8.0 mmHg LVOT Vmax:         153.00 cm/s LVOT Vmean:        95.700 cm/s LVOT VTI:          0.304 m LVOT/AV VTI ratio: 0.73  AORTA Ao Root diam: 3.20 cm MITRAL VALVE               TRICUSPID VALVE MV Area (PHT): 2.60 cm    TR Peak grad:   26.8 mmHg MV Area VTI:   3.69 cm    TR Vmax:        259.00 cm/s MV Peak grad:  3.6 mmHg MV Mean grad:  1.0 mmHg    SHUNTS  MV Vmax:       0.96 m/s    Systemic VTI:  0.30 m MV Vmean:      51.7 cm/s   Systemic  Diam: 2.10 cm MV Decel Time: 292 msec MV E velocity: 93.80 cm/s MV A velocity: 67.30 cm/s MV E/A ratio:  1.39 Landscape architect signed by Phineas Inches Signature Date/Time: 06/14/2021/3:59:39 PM    Final      LOS: 4 days   Oren Binet, MD  [facility]    To contact the attending provider between 7A-7P or the covering provider during after hours 7P-7A, please log into the web site [email or web address] and access using universal [place] password for that web site. If you do not have the password, please call the hospital operator.  06/16/2021, 3:36 PM

## 2021-06-16 NOTE — Progress Notes (Signed)
[facility]   Reason for visit: Follow up on HIV/AIDS, lung abscess, retinal detachment  Interval History: no acute events; Tmax 100.4, WBC wnl.   Day 5 antiviral Day 2 amp/sulbactam Day 2 SMX/TMP Day 2 Bictarvy  Physical Exam: Constitutional:  Vitals:   06/16/21 0400 06/16/21 0833  BP: (!) 104/91 120/84  Pulse: 73 69  Resp: 15 18  Temp: (!) 97.4 F (36.3 C) (!) 97.3 F (36.3 C)  SpO2: 94%    patient appears in NAD Respiratory: Normal respiratory effort; CTA B Cardiovascular: RRR Skin: no rashes  Review of Systems: Constitutional: negative for chills Gastrointestinal: negative for nausea and diarrhea Integument/breast: negative for rash  Lab Results  Component Value Date   WBC 6.4 06/16/2021   HGB 7.3 (L) 06/16/2021   HCT 22.4 (L) 06/16/2021   MCV 83.0 06/16/2021   PLT 528 (H) 06/16/2021    Lab Results  Component Value Date   CREATININE 0.62 06/16/2021   BUN 5 (L) 06/16/2021   NA 134 (L) 06/16/2021   K 4.0 06/16/2021   CL 103 06/16/2021   CO2 25 06/16/2021    Lab Results  Component Value Date   ALT 49 (H) 06/15/2021   AST 30 06/15/2021   ALKPHOS 79 06/15/2021     Microbiology: Recent Results (from the past 240 hour(s))  Expectorated Sputum Assessment w Gram Stain, Rflx to Resp Cult     Status: None   Collection Time: 06/12/21 11:04 AM   Specimen: Sputum  Result Value Ref Range Status   Specimen Description   Final    SPUTUM Performed at [facility], [address] [place], [place] [address]    Special Requests   Final    Immunocompromised Performed at [facility], [address] [place], [place] [address]    Sputum evaluation   Final    THIS SPECIMEN IS ACCEPTABLE FOR SPUTUM CULTURE Performed at [facility], [address] [place], [place] [address]    Report Status 06/12/2021 FINAL  Final  Culture, Respiratory w Gram Stain     Status: None   Collection Time: 06/12/21 11:04 AM    Specimen: SPU  Result Value Ref Range Status   Specimen Description SPUTUM  Final   Special Requests Immunocompromised Reflexed from [id number]  Final   Gram Stain   Final    ABUNDANT WBC PRESENT,BOTH PMN AND MONONUCLEAR ABUNDANT GRAM POSITIVE COCCI FEW GRAM NEGATIVE RODS FEW GRAM POSITIVE RODS    Culture   Final    MODERATE Consistent with normal respiratory flora. Performed at [facility], [address], [place], [place] [address]    Report Status 06/15/2021 FINAL  Final  Resp Panel by RT-PCR (Flu A&B, Covid) Nasopharyngeal Swab     Status: None   Collection Time: 06/12/21 11:30 AM   Specimen: Nasopharyngeal Swab; Nasopharyngeal(NP) swabs in vial transport medium  Result Value Ref Range Status   SARS Coronavirus 2 by RT PCR NEGATIVE NEGATIVE Final    Comment: (NOTE) SARS-CoV-2 target nucleic acids are NOT DETECTED.  The SARS-CoV-2 RNA is generally detectable in upper respiratory specimens during the acute phase of infection. The lowest concentration of SARS-CoV-2 viral copies this assay can detect is 138 copies/mL. A negative result does not preclude SARS-Cov-2 infection and should not be used as the sole basis for treatment or other patient management decisions. A negative result may occur with  improper specimen collection/handling, submission  of specimen other than nasopharyngeal swab, presence of viral mutation(s) within the areas targeted by this assay, and inadequate number of viral copies(<138 copies/mL). A negative result must be combined with clinical observations, patient history, and epidemiological information. The expected result is Negative.  Fact Sheet for Patients:  BloggerCourse.com  Fact Sheet for Healthcare Providers:  SeriousBroker.it  This test is no t yet approved or cleared by the [place] FDA and  has been authorized for detection and/or diagnosis of SARS-CoV-2 by FDA under an Emergency  Use Authorization (EUA). This EUA will remain  in effect (meaning this test can be used) for the duration of the COVID-19 declaration under Section 564(b)(1) of the Act, 21 U.S.C.section 360bbb-3(b)(1), unless the authorization is terminated  or revoked sooner.       Influenza A by PCR NEGATIVE NEGATIVE Final   Influenza B by PCR NEGATIVE NEGATIVE Final    Comment: (NOTE) The Xpert Xpress SARS-CoV-2/FLU/RSV plus assay is intended as an aid in the diagnosis of influenza from Nasopharyngeal swab specimens and should not be used as a sole basis for treatment. Nasal washings and aspirates are unacceptable for Xpert Xpress SARS-CoV-2/FLU/RSV testing.  Fact Sheet for Patients: BloggerCourse.com  Fact Sheet for Healthcare Providers: SeriousBroker.it  This test is not yet approved or cleared by the [place] FDA and has been authorized for detection and/or diagnosis of SARS-CoV-2 by FDA under an Emergency Use Authorization (EUA). This EUA will remain in effect (meaning this test can be used) for the duration of the COVID-19 declaration under Section 564(b)(1) of the Act, 21 U.S.C. section 360bbb-3(b)(1), unless the authorization is terminated or revoked.  Performed at [facility], [address] [place], [place] [address]   Blood Culture (routine x 2)     Status: None (Preliminary result)   Collection Time: 06/12/21 11:30 AM   Specimen: BLOOD  Result Value Ref Range Status   Specimen Description   Final    BLOOD Blood Culture adequate volume Performed at [facility], [address] [place], [place] [address]    Special Requests   Final    BOTTLES DRAWN AEROBIC AND ANAEROBIC RIGHT ANTECUBITAL Performed at [facility], [address] [place], [place] [address]    Culture   Final    NO GROWTH 4 DAYS Performed at [facility], [address] [place], [place] [address]    Report  Status PENDING  Incomplete  Blood Culture (routine x 2)     Status: None (Preliminary result)   Collection Time: 06/12/21 11:50 AM   Specimen: BLOOD  Result Value Ref Range Status   Specimen Description   Final    BLOOD Blood Culture adequate volume Performed at [facility], [address] [place], [place] [address]    Special Requests   Final    BOTTLES DRAWN AEROBIC AND ANAEROBIC BLOOD RIGHT FOREARM Performed at [facility], [address], [place], [place] [address]    Culture   Final    NO GROWTH 4 DAYS Performed at [facility], [address] [place], [place] [address]    Report Status PENDING  Incomplete  Urine Culture     Status: None   Collection Time: 06/12/21  1:45 PM   Specimen: In/Out Cath Urine  Result Value Ref Range Status   Specimen Description   Final    IN/OUT CATH URINE Performed at [facility]  [address], [place], [place] [address]    Special Requests   Final    Immunocompromised Performed at [facility], [address] [place], [place] [address]    Culture   Final    NO GROWTH Performed at [facility], [address] [place], [place] [address]    Report Status 06/14/2021 FINAL  Final  Acid Fast Smear (AFB)     Status: None   Collection Time: 06/12/21  1:45 PM   Specimen: Sputum  Result Value Ref Range Status   AFB Specimen Processing Concentration  Final   Acid Fast Smear Negative  Final    Comment: (NOTE) Performed At: [facility] [address] [place], [place] [id number] Jolene Schimke MD OY:[id number]    Source (AFB) SPUTUM  Final    Comment: Performed at [facility], [address] [place], [place] [address]  MTB-RIF NAA with AFB Culture, sputum     Status: None (Preliminary result)   Collection Time: 06/12/21  1:45 PM   Specimen: Sputum  Result Value Ref Range Status   Myco tuberculosis Complex PENDING  Incomplete   Rifampin PENDING  Incomplete    AFB Specimen Processing Concentration  Final    Comment: (NOTE) Performed At: [facility] [address] [place], [place] [id number] Jolene Schimke MD OQ:[id number]    Acid Fast Culture PENDING  Incomplete   Source (MTB RIF) SPUTUM  Final    Comment: Performed at [facility], [address], [place], [place] [address]  Acid Fast Smear (AFB)     Status: None   Collection Time: 06/15/21 12:21 AM   Specimen: Expectorated Sputum  Result Value Ref Range Status   AFB Specimen Processing Concentration  Final   Acid Fast Smear Negative  Final    Comment: (NOTE) Performed At: [facility] [address] [place], [place] [id number] Jolene Schimke MD FV:[id number]    Source (AFB) SPUTUM  Final    Comment: Performed at [facility], [address] [place], [place] [address]    Impression/Plan:  1. 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  Less likely CMV-related infection based on visual inspection.  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   He is on acyclovir for prophylaxis of the left eye in case he has disease there, though no symptoms to date with no floaters or new concerns.   He will continue with oral valacyclovir at discharge as prophlyaxis while he has improved immunoreconstitution and he should follow up with the retinal specialist Dr. Arlys John after discharge.    2.  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  He is on ampicillin/sulbactam and will need Augmentin at discharge for about 1 month.    3.  HIV/AIDS - started Biktarvy despite #1 since no expected recovery and good eye without disease noted on exam. He will need follow up for his HIV care and reports he has an appointment in [facility] with [facility].  If not, he can follow up in our clinic.    Ok from ID standpoint for discharge tomorrow once arrangements  have been arranged.

## 2021-06-17 ENCOUNTER — Other Ambulatory Visit: Payer: Self-pay

## 2021-06-17 ENCOUNTER — Telehealth: Payer: Self-pay

## 2021-06-17 ENCOUNTER — Other Ambulatory Visit (HOSPITAL_COMMUNITY): Payer: Self-pay

## 2021-06-17 DIAGNOSIS — B20 Human immunodeficiency virus [HIV] disease: Secondary | ICD-10-CM

## 2021-06-17 LAB — CULTURE, BLOOD (ROUTINE X 2)
Culture: NO GROWTH
Culture: NO GROWTH
Specimen Description: ADEQUATE
Specimen Description: ADEQUATE

## 2021-06-17 LAB — QUANTIFERON-TB GOLD PLUS (RQFGPL)
QuantiFERON Mitogen Value: 0.09 IU/mL
QuantiFERON Nil Value: 0.01 IU/mL
QuantiFERON TB1 Ag Value: 0.01 IU/mL
QuantiFERON TB2 Ag Value: 0.01 IU/mL

## 2021-06-17 LAB — ACID FAST SMEAR (AFB, MYCOBACTERIA): Acid Fast Smear: NEGATIVE

## 2021-06-17 LAB — QUANTIFERON-TB GOLD PLUS: QuantiFERON-TB Gold Plus: UNDETERMINED — AB

## 2021-06-17 MED ORDER — VITAMIN B-12 1000 MCG PO TABS
1000.0000 ug | ORAL_TABLET | Freq: Every day | ORAL | 2 refills | Status: DC
  Filled 2021-06-17: qty 30, 30d supply, fill #0

## 2021-06-17 NOTE — Progress Notes (Addendum)
Explained discharge instructions to patient. Reviewed all follow-up appts and next med administration time. Removed patient's IV and telemetry monitoring. CCMD was notified the patient was being discharged home. Awaiting on ride. Not appropriate for d/c lounge due to Airborne precautions.

## 2021-06-17 NOTE — Plan of Care (Signed)

## 2021-06-17 NOTE — Discharge Summary (Signed)
PATIENT DETAILS Name: Mason Moran Age: 51 y.o. Sex: male Date of Birth: 09/29/1970 MRN: [id number]. Admitting Physician: Sunnie Nielsen, DO PCP:Pcp, No  Admit Date: 06/12/2021 Discharge date: 06/17/2021  Recommendations for Outpatient Follow-up:  Follow up with PCP in 1-2 weeks Please obtain CMP/CBC in one week Please follow sputum AFB cultures until final Please follow CMV DNA results-pending at the time of discharge Please repeat CT imaging after completion of 4 weeks of Augmentin to assess response to treatment.  May need PCCM evaluation at some point in time.  Admitted From:  Home  Disposition: Home   Discharge Condition: fair  CODE STATUS:   Code Status: Full Code   Diet recommendation:  Diet Order             Diet general           Diet regular Room service appropriate? Yes; Fluid consistency: Thin  Diet effective now                    Brief Summary: Patient is a 51 y.o.  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  Significant Hospital events: 2/3>> admit to [facility] PNA/left eye visual loss.  Significant imaging studies: 2/3>> CT head: No acute intracranial abnormality 2/3>> CXR: Large right lower lobe PNA 2/4>> CT chest: 7.2 cm lung abscess in right lower lobe.  Significant microbiology data: 2/3>> COVID/flu PCR: Negative 2/3>> blood culture: No growth 2/3>> urine culture: Pending 2/3>> sputum AFB smear: Negative 2/3>> sputum culture: Normal respiratory flora 2/6>> sputum AFB smear: Negative 2/6>>CMV DNR>>pending 2/7>> sputum AFB smear: Pending  Procedures: None  Consults:  ID Ophthalmology  Brief Hospital Course: * Sepsis due to RLL lung abscess/necrotizing pneumonia- (present on admission) Sepsis physiology has resolved-continues to have intermittent fever-which is to be expected given  advanced HIV-and multiple ongoing infections-has been restarted on antiretroviral so could have IRIS as well.  Multiple differentials for lung abscess -CMV, recurrent Mycobacterium kansasii infection-and usual bacterial organisms.  Sputum cultures negative-AFB smears negative so far.  Discussed with infectious disease-transition from Unasyn to Augmentin x4 weeks on discharge.  Will need to repeat CT chest post treatment to assess response to treatment.  Acute respiratory failure with hypoxia (HCC)- (present on admission) Hypoxia seems to have resolved-on room air.  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  Discussed with ID-plan to transition to Valtrex on discharge.  Right eye appears unchanged overnight.  Please ensure follow-up with ophthalmology postdischarge.  Anemia- (present on admission) Due to underlying chronic disease/HIV-transfused 1 unit of PRBC on 2/4-hemoglobin stable this morning.  No evidence of bleeding.  Please repeat CBC periodically as an outpatient.  HIV/AIDS  (CD4 count of 37 on 2/3)- (present on admission) Recently incarcerated-off antiretrovirals for several years.  ID consulted-has been started on Biktarvy.  On Bactrim for PJP prophylaxis.  Elevated platelet count Likely reactive thrombocytosis in the setting of HIV/AIDS.  Transaminitis Unclear etiology-could be related to underlying infectious issue affecting eyes/lung-acute hepatitis serology was negative.  RUQ ultrasound without any acute liver issues.  Thankfully transaminitis slowly improving.  History of TB (tuberculosis) History of Mycobacterium kansasii in 2018-Per chart review and Care Everywhere-this was apparently treated.  AFB smear x3 negative-please follow sputum AFB cultures until final.  Hyponatremia Mild  hyponatremia on admission likely due to hypovolemia-responded to IVF.  Follow electrolytes closely.  Hypokalemia Repleted  Hypocalcemia Mild borderline hypercalcemia due to hypoalbuminemia-follow closely.  Vitamin B12 deficiency- (present on admission) Borderline vitamin B12 levels-continue supplementation.  Nutrition Status: Nutrition Problem: Inadequate oral intake Etiology: decreased appetite Signs/Symptoms: per patient/family report Interventions: Ensure Enlive (each supplement provides 350kcal and 20 grams of protein), MVI    Obesity: Estimated body mass index is 21.97 kg/m as calculated from the following:   Height as of this encounter: 6' (1.829 m).   Weight as of this encounter: 73.5 kg.    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   HIV/AIDS  (CD4 count of 37 on 2/3)   Anemia   Elevated platelet count   Transaminitis   History of TB (tuberculosis)   Hyponatremia   Hypokalemia   Hypocalcemia   Vitamin B12 deficiency   Discharge Instructions:  Activity:  As tolerated   Discharge Instructions     Call MD for:  difficulty breathing, headache or visual disturbances   Complete by: As directed    Diet general   Complete by: As directed    Discharge instructions   Complete by: As directed    Follow with Primary MD  in 1-2 weeks  Please get a complete blood count and chemistry panel checked by your Primary MD at your next visit, and again as instructed by your Primary MD.  Get Medicines reviewed and adjusted: Please take all your medications with you for your next visit with your Primary MD  Laboratory/radiological data: Please request your Primary MD to go over all hospital tests and procedure/radiological results at the follow up, please ask your Primary MD  to get all Hospital records sent to his/her office.  In some cases, they will be blood work, cultures and biopsy results pending at the time of your discharge. Please request that your primary care M.D. follows up on these results.  Also Note the following: If you experience worsening of your admission symptoms, develop shortness of breath, life threatening emergency, suicidal or homicidal thoughts you must seek medical attention immediately by calling 911 or calling your MD immediately  if symptoms less severe.  You must read complete instructions/literature along with all the possible adverse reactions/side effects for all the Medicines you take and that have been prescribed to you. Take any new Medicines after you have completely understood and accpet all the possible adverse reactions/side effects.   Do not drive when taking Pain medications or sleeping medications (Benzodaizepines)  Do not take more than prescribed Pain, Sleep and Anxiety Medications. It is not advisable to combine anxiety,sleep and pain medications without talking with your primary care practitioner  Special Instructions: If you have smoked or chewed Tobacco  in the last 2 yrs please stop smoking, stop any regular Alcohol  and or any Recreational drug use.  Wear Seat belts while driving.  Please note: You were cared for by a hospitalist during your hospital stay. Once you are discharged, your primary care physician will handle any further medical issues. Please note that NO REFILLS for any discharge medications will be authorized once you are discharged, as it is imperative that you return to your primary care physician (or establish a relationship with a primary care physician if you do not  have one) for your post hospital discharge needs so that they can reassess your need for medications and monitor your lab values.   Increase activity slowly   Complete by: As directed       Allergies as of 06/17/2021       Reactions    Dapsone Other (See Comments)   Other reaction(s): Other (See Comments) G6PD deficiency   Other Other (See Comments)   Seafood - can't speak   Primaquine    Other reaction(s): Other (See Comments) G6PD deficiency        Medication List     TAKE these medications    amoxicillin-clavulanate 875-125 MG tablet Commonly known as: Augmentin Take 1 tablet by mouth 2 (two) times daily for 28 days.   bictegravir-emtricitabine-tenofovir AF 50-200-25 MG Tabs tablet Commonly known as: BIKTARVY Take 1 tablet by mouth daily.   sulfamethoxazole-trimethoprim 800-160 MG tablet Commonly known as: BACTRIM DS Take 1 tablet by mouth daily.   valACYclovir 500 MG tablet Commonly known as: VALTREX Take 1 tablet (500 mg total) by mouth 2 (two) times daily.   vitamin B-12 1000 MCG tablet Commonly known as: CYANOCOBALAMIN Take 1 tablet (1,000 mcg total) by mouth daily.        Follow-up Information     Ladell Pier, MD Follow up.   Specialty: Internal Medicine Why: TIME : 2:30 PM DATE: July 21, 2021 DOCTOR : NEWLIN NEW LOCATION : [address], Hospital follow up Contact information: [place] [address] [phone]         Thayer Headings, MD Follow up on 07/02/2021.   Specialty: Infectious Diseases Why: appt at 3 pm Contact information: [address] [address] [phone]         Debbra Riding, MD. Schedule an appointment as soon as possible for a visit in 1 week(s).   Specialty: Ophthalmology Contact information: [address] [place] [address] [phone]                Allergies  Allergen Reactions   Dapsone Other (See Comments)    Other reaction(s): Other (See Comments) G6PD deficiency   Other Other (See Comments)    Seafood - can't speak   Primaquine     Other reaction(s): Other (See Comments) G6PD deficiency     Other Procedures/Studies: CT Head Wo  Contrast  Result Date: 06/12/2021 CLINICAL DATA:  Vision loss.  No reported injury. EXAM: CT HEAD WITHOUT CONTRAST TECHNIQUE: Contiguous axial images were obtained from the base of the skull through the vertex without intravenous contrast. RADIATION DOSE REDUCTION: This exam was performed according to the departmental dose-optimization program which includes automated exposure control, adjustment of the mA and/or kV according to patient size and/or use of iterative reconstruction technique. COMPARISON:  10/05/2015 head CT. FINDINGS: Brain: No evidence of parenchymal hemorrhage or extra-axial fluid collection. No mass lesion, mass effect, or midline shift. No CT evidence of acute infarction. Cerebral volume is age appropriate. No ventriculomegaly. Vascular: No acute abnormality. Skull: No evidence of calvarial fracture. Sinuses/Orbits: The visualized paranasal sinuses are essentially clear. Heterogeneous mild hyperdensity is seen layering in the vitreous of the globes bilaterally, left greater than right (series 2/image 5). Other:  The mastoid air cells are unopacified. IMPRESSION: 1. No evidence of acute intracranial abnormality. 2. Heterogeneous mild hyperdensity layering in the vitreous of the globes bilaterally, left greater than right, cannot exclude vitreous hemorrhage. Electronically Signed  By: Ilona Sorrel M.D.   On: 06/12/2021 11:27   CT CHEST W CONTRAST  Result Date: 06/13/2021 CLINICAL DATA:  Pneumonia, evaluate for complications EXAM: CT CHEST WITH CONTRAST TECHNIQUE: Multidetector CT imaging of the chest was performed during intravenous contrast administration. RADIATION DOSE REDUCTION: This exam was performed according to the departmental dose-optimization program which includes automated exposure control, adjustment of the mA and/or kV according to patient size and/or use of iterative reconstruction technique. CONTRAST:  61mL OMNIPAQUE IOHEXOL 300 MG/ML  SOLN COMPARISON:  Chest radiographs done  on 06/12/2021 FINDINGS: Cardiovascular: Heart is enlarged in size. Small pericardial effusion is present. Mediastinum/Nodes: No significant lymphadenopathy seen. Lungs/Pleura: There is large infiltrate in the posterior right lower lobe. There are multiple loculated fluid collections with air-fluid levels within the infiltrate. Largest of these collections measures approximately 7.2 cm. Small right pleural effusion is present. There are no pockets of air in the right pleural effusion. Small subpleural patchy density is seen in the left lower lobe. There is small linear density in the posterior right apex, possibly suggesting scarring. There are few small pleural-based nodules in the right upper lung fields each measuring less than 4 mm. There is mild ectasia of bronchi. Upper Abdomen: Liver is enlarged in size. Musculoskeletal: Unremarkable. IMPRESSION: There is large infiltrate in the posterior right lower lobe suggesting pneumonia. There are multiple loculated fluid collections with air-fluid levels within this infiltrate. Findings suggest necrotic pneumonia with multiple lung abscesses largest measuring 7.2 cm in diameter. Small right pleural effusion is present. There is no pneumothorax. Small patchy infiltrate is seen in the subpleural location in the posterior left lower lobe suggesting atelectasis/pneumonia. Linear density in the right apex may suggest scarring. Small pericardial effusion is present. These results will be called to the ordering clinician or representative by the Radiologist Assistant, and communication documented in the PACS or [employer]. Electronically Signed   By: Elmer Picker M.D.   On: 06/13/2021 12:05   DG Chest Portable 1 View  Result Date: 06/12/2021 CLINICAL DATA:  Cough shortness of breath EXAM: PORTABLE CHEST 1 VIEW COMPARISON:  10/05/2015 FINDINGS: There is moderate to large alveolar infiltrate in the medial right lower lung fields. Right cardiac margin is  preserved. Rest of the lung fields are clear. There is no pleural effusion or pneumothorax. IMPRESSION: There is new large infiltrate in the right lower lung fields suggesting possible pneumonia in the right lower lobe. Electronically Signed   By: Elmer Picker M.D.   On: 06/12/2021 11:21   ECHOCARDIOGRAM COMPLETE  Result Date: 06/14/2021    ECHOCARDIOGRAM REPORT   Patient Name:   Mason Moran Date of Exam: 06/14/2021 Medical Rec #:  [id number]    Height:       72.0 in Accession #:    [id number]   Weight:       162.0 lb Date of Birth:  May 17, 1970    BSA:          1.948 m Patient Age:    23 years     BP:           119/83 mmHg Patient Gender: M            HR:           91 bpm. Exam Location:  Inpatient Procedure: 2D Echo, Cardiac Doppler and Color Doppler Indications:    Acute respiratory distress  History:        Patient has no prior history of Echocardiogram examinations.  Sonographer:  Wenda Low Referring Phys: [place]  1. Left ventricular ejection fraction, by estimation, is 50 to 55%. The left ventricle has low normal function. The left ventricle has no regional wall motion abnormalities. There is mild left ventricular hypertrophy. Left ventricular diastolic parameters were normal.  2. Right ventricular systolic function is normal. The right ventricular size is normal. There is normal pulmonary artery systolic pressure.  3. A small pericardial effusion is present. There is no evidence of cardiac tamponade.  4. The mitral valve is grossly normal. Mild mitral valve regurgitation.  5. The aortic valve is grossly normal. Aortic valve regurgitation is not visualized. No aortic stenosis is present.  6. The inferior vena cava is normal in size with greater than 50% respiratory variability, suggesting right atrial pressure of 3 mmHg. Conclusion(s)/Recommendation(s): Otherwise normal echocardiogram, with minor abnormalities described in the report. FINDINGS  Left Ventricle: Left  ventricular ejection fraction, by estimation, is 50 to 55%. The left ventricle has low normal function. The left ventricle has no regional wall motion abnormalities. The left ventricular internal cavity size was normal in size. There is mild left ventricular hypertrophy. Left ventricular diastolic parameters were normal. Right Ventricle: The right ventricular size is normal. No increase in right ventricular wall thickness. Right ventricular systolic function is normal. There is normal pulmonary artery systolic pressure. The tricuspid regurgitant velocity is 2.59 m/s, and  with an assumed right atrial pressure of 3 mmHg, the estimated right ventricular systolic pressure is [id number] mmHg. Left Atrium: Left atrial size was normal in size. Right Atrium: Right atrial size was normal in size. Pericardium: A small pericardial effusion is present. There is no evidence of cardiac tamponade. Mitral Valve: The mitral valve is grossly normal. Mild mitral valve regurgitation. MV peak gradient, 3.6 mmHg. The mean mitral valve gradient is 1.0 mmHg. Tricuspid Valve: The tricuspid valve is grossly normal. Tricuspid valve regurgitation is trivial. Aortic Valve: The aortic valve is grossly normal. Aortic valve regurgitation is not visualized. No aortic stenosis is present. Aortic valve mean gradient measures 8.0 mmHg. Aortic valve peak gradient measures 18.5 mmHg. Aortic valve area, by VTI measures  2.51 cm. Pulmonic Valve: The pulmonic valve was normal in structure. Pulmonic valve regurgitation is not visualized. Aorta: The aortic root is normal in size and structure. Venous: The inferior vena cava is normal in size with greater than 50% respiratory variability, suggesting right atrial pressure of 3 mmHg. IAS/Shunts: No atrial level shunt detected by color flow Doppler.  LEFT VENTRICLE PLAX 2D LVIDd:         4.80 cm   Diastology LVIDs:         3.90 cm   LV e' medial:   11.10 cm/s LV PW:         1.50 cm   LV E/e' medial: 8.5 LV IVS:         1.30 cm LVOT diam:     2.10 cm LV SV:         105 LV SV Index:   54 LVOT Area:     3.46 cm  RIGHT VENTRICLE RV Basal diam:  3.40 cm RV Mid diam:    3.00 cm RV S prime:     17.00 cm/s TAPSE (M-mode): 2.7 cm LEFT ATRIUM             Index        RIGHT ATRIUM           Index LA diam:  3.50 cm 1.80 cm/m   RA Area:     20.00 cm LA Vol (A2C):   69.4 ml 35.63 ml/m  RA Volume:   60.00 ml  30.80 ml/m LA Vol (A4C):   48.8 ml 25.05 ml/m LA Biplane Vol: 63.5 ml 32.60 ml/m  AORTIC VALVE AV Area (Vmax):    2.46 cm AV Area (Vmean):   2.59 cm AV Area (VTI):     2.51 cm AV Vmax:           215.00 cm/s AV Vmean:          128.000 cm/s AV VTI:            0.419 m AV Peak Grad:      18.5 mmHg AV Mean Grad:      8.0 mmHg LVOT Vmax:         153.00 cm/s LVOT Vmean:        95.700 cm/s LVOT VTI:          0.304 m LVOT/AV VTI ratio: 0.73  AORTA Ao Root diam: 3.20 cm MITRAL VALVE               TRICUSPID VALVE MV Area (PHT): 2.60 cm    TR Peak grad:   26.8 mmHg MV Area VTI:   3.69 cm    TR Vmax:        259.00 cm/s MV Peak grad:  3.6 mmHg MV Mean grad:  1.0 mmHg    SHUNTS MV Vmax:       0.96 m/s    Systemic VTI:  0.30 m MV Vmean:      51.7 cm/s   Systemic Diam: 2.10 cm MV Decel Time: 292 msec MV E velocity: 93.80 cm/s MV A velocity: 67.30 cm/s MV E/A ratio:  1.39 Photographer signed by Carolan Clines Signature Date/Time: 06/14/2021/3:59:39 PM    Final    US Abdomen Limited RUQ (LIVER/GB)  Result Date: 06/13/2021 CLINICAL DATA:  Transaminitis EXAM: ULTRASOUND ABDOMEN LIMITED RIGHT UPPER QUADRANT COMPARISON:  None. FINDINGS: Gallbladder: Gallbladder is contracted. No gallbladder wall thickening or pericholecystic fluid. Questionable 5 mm stone within the gallbladder fundus region. No sonographic Murphy's sign elicited during the exam. Common bile duct: Diameter: 8 mm Liver: No focal lesion identified. Within normal limits in parenchymal echogenicity. Portal vein is patent on color Doppler imaging with normal direction  of blood flow towards the liver. Other: RIGHT pleural effusion. IMPRESSION: 1. Gallbladder is unremarkable. No evidence of cholecystitis. Questionable 5 mm gallstone, versus artifact. 2. CBD dilatation, measuring 8 mm diameter. Recommend correlation with liver function tests. If abnormal LFTs, when the patient is clinically stable and able to follow directions and hold their breath (preferably as an outpatient), further evaluation with dedicated MRCP should be considered. Otherwise, consider follow-up RIGHT upper quadrant ultrasound in 3-6 months to ensure stability. Electronically Signed   By: Bary Richard M.D.   On: 06/13/2021 15:41     TODAY-DAY OF DISCHARGE:  Subjective:   Mason Moran today has no headache,no chest abdominal pain,no new weakness tingling or numbness, feels much better wants to go home today.   Objective:   Blood pressure 106/73, pulse 97, temperature 97.8 F (36.6 C), temperature source Oral, resp. rate 20, height 6' (1.829 m), weight 73.5 kg, SpO2 93 %.  Intake/Output Summary (Last 24 hours) at 06/17/2021 1012 Last data filed at 06/17/2021 0422 Gross per 24 hour  Intake 1040 ml  Output 1600 ml  Net -560 ml   Filed  Weights   06/12/21 1029 06/12/21 2347  Weight: 74.4 kg 73.5 kg    Exam: Awake Alert, Oriented *3, No new F.N deficits, Normal affect [place].AT,PERRAL Supple Neck,No JVD, No cervical lymphadenopathy appriciated.  Symmetrical Chest wall movement, Good air movement bilaterally, CTAB RRR,No Gallops,Rubs or new Murmurs, No Parasternal Heave +ve B.Sounds, Abd Soft, Non tender, No organomegaly appriciated, No rebound -guarding or rigidity. No Cyanosis, Clubbing or edema, No new Rash or bruise   PERTINENT RADIOLOGIC STUDIES: No results found.   PERTINENT LAB RESULTS: CBC: Recent Labs    06/15/21 0343 06/16/21 0120  WBC 8.1 6.4  HGB 7.4* 7.3*  HCT 23.9* 22.4*  PLT 556* 528*   CMET CMP     Component Value Date/Time   NA 134 (L) 06/16/2021 0120    K 4.0 06/16/2021 0120   CL 103 06/16/2021 0120   CO2 25 06/16/2021 0120   GLUCOSE 102 (H) 06/16/2021 0120   BUN 5 (L) 06/16/2021 0120   CREATININE 0.62 06/16/2021 0120   CALCIUM 7.3 (L) 06/16/2021 0120   CALCIUM 7.5 (L) 06/13/2021 0143   PROT 6.0 (L) 06/15/2021 0343   ALBUMIN <1.5 (L) 06/15/2021 0343   AST 30 06/15/2021 0343   ALT 49 (H) 06/15/2021 0343   ALKPHOS 79 06/15/2021 0343   BILITOT 0.4 06/15/2021 0343   GFRNONAA >60 06/16/2021 0120    GFR Estimated Creatinine Clearance: 113.6 mL/min (by C-G formula based on SCr of 0.62 mg/dL). No results for input(s): LIPASE, AMYLASE in the last 72 hours. No results for input(s): CKTOTAL, CKMB, CKMBINDEX, TROPONINI in the last 72 hours. Invalid input(s): POCBNP No results for input(s): DDIMER in the last 72 hours. No results for input(s): HGBA1C in the last 72 hours. No results for input(s): CHOL, HDL, LDLCALC, TRIG, CHOLHDL, LDLDIRECT in the last 72 hours. No results for input(s): TSH, T4TOTAL, T3FREE, THYROIDAB in the last 72 hours.  Invalid input(s): FREET3 No results for input(s): VITAMINB12, FOLATE, FERRITIN, TIBC, IRON, RETICCTPCT in the last 72 hours. Coags: No results for input(s): INR in the last 72 hours.  Invalid input(s): PT Microbiology: Recent Results (from the past 240 hour(s))  Expectorated Sputum Assessment w Gram Stain, Rflx to Resp Cult     Status: None   Collection Time: 06/12/21 11:04 AM   Specimen: Sputum  Result Value Ref Range Status   Specimen Description   Final    SPUTUM Performed at [facility], [address] [place], [place] [address]    Special Requests   Final    Immunocompromised Performed at [facility], [address] [place], [place] [address]    Sputum evaluation   Final    THIS SPECIMEN IS ACCEPTABLE FOR SPUTUM CULTURE Performed at [facility], [place] [address] [place], [place] [address]    Report Status 06/12/2021 FINAL  Final  Culture, Respiratory w  Gram Stain     Status: None   Collection Time: 06/12/21 11:04 AM   Specimen: SPU  Result Value Ref Range Status   Specimen Description SPUTUM  Final   Special Requests Immunocompromised Reflexed from [id number]  Final   Gram Stain   Final    ABUNDANT WBC PRESENT,BOTH PMN AND MONONUCLEAR ABUNDANT GRAM POSITIVE COCCI FEW GRAM NEGATIVE RODS FEW GRAM POSITIVE RODS    Culture   Final    MODERATE Consistent with normal respiratory flora. Performed at [facility], [place] [address], [place], [place] [address]    Report  Status 06/15/2021 FINAL  Final  Resp Panel by RT-PCR (Flu A&B, Covid) Nasopharyngeal Swab     Status: None   Collection Time: 06/12/21 11:30 AM   Specimen: Nasopharyngeal Swab; Nasopharyngeal(NP) swabs in vial transport medium  Result Value Ref Range Status   SARS Coronavirus 2 by RT PCR NEGATIVE NEGATIVE Final    Comment: (NOTE) SARS-CoV-2 target nucleic acids are NOT DETECTED.  The SARS-CoV-2 RNA is generally detectable in upper respiratory specimens during the acute phase of infection. The lowest concentration of SARS-CoV-2 viral copies this assay can detect is 138 copies/mL. A negative result does not preclude SARS-Cov-2 infection and should not be used as the sole basis for treatment or other patient management decisions. A negative result may occur with  improper specimen collection/handling, submission of specimen other than nasopharyngeal swab, presence of viral mutation(s) within the areas targeted by this assay, and inadequate number of viral copies(<138 copies/mL). A negative result must be combined with clinical observations, patient history, and epidemiological information. The expected result is Negative.  Fact Sheet for Patients:  EntrepreneurPulse.com.au  Fact Sheet for Healthcare Providers:  IncredibleEmployment.be  This test is no t yet approved or cleared by the [place] FDA and  has been authorized for  detection and/or diagnosis of SARS-CoV-2 by FDA under an Emergency Use Authorization (EUA). This EUA will remain  in effect (meaning this test can be used) for the duration of the COVID-19 declaration under Section 564(b)(1) of the Act, 21 U.S.C.section 360bbb-3(b)(1), unless the authorization is terminated  or revoked sooner.       Influenza A by PCR NEGATIVE NEGATIVE Final   Influenza B by PCR NEGATIVE NEGATIVE Final    Comment: (NOTE) The Xpert Xpress SARS-CoV-2/FLU/RSV plus assay is intended as an aid in the diagnosis of influenza from Nasopharyngeal swab specimens and should not be used as a sole basis for treatment. Nasal washings and aspirates are unacceptable for Xpert Xpress SARS-CoV-2/FLU/RSV testing.  Fact Sheet for Patients: EntrepreneurPulse.com.au  Fact Sheet for Healthcare Providers: IncredibleEmployment.be  This test is not yet approved or cleared by the [place] FDA and has been authorized for detection and/or diagnosis of SARS-CoV-2 by FDA under an Emergency Use Authorization (EUA). This EUA will remain in effect (meaning this test can be used) for the duration of the COVID-19 declaration under Section 564(b)(1) of the Act, 21 U.S.C. section 360bbb-3(b)(1), unless the authorization is terminated or revoked.  Performed at [facility], [address] [place], [place] [address]   Blood Culture (routine x 2)     Status: None   Collection Time: 06/12/21 11:30 AM   Specimen: BLOOD  Result Value Ref Range Status   Specimen Description   Final    BLOOD Blood Culture adequate volume Performed at [facility], [address] [place], [place] [address]    Special Requests   Final    BOTTLES DRAWN AEROBIC AND ANAEROBIC RIGHT ANTECUBITAL Performed at [facility], [address] [place], [place] [address]    Culture   Final    NO GROWTH 5 DAYS Performed at [facility], [place] [address], [place], [place] [address]    Report Status 06/17/2021 FINAL  Final  Blood Culture (routine x 2)     Status: None   Collection Time: 06/12/21 11:50 AM   Specimen: BLOOD  Result Value Ref Range Status   Specimen Description   Final    BLOOD Blood Culture  adequate volume Performed at [facility], [address] [place], [place] [address]    Special Requests   Final    BOTTLES DRAWN AEROBIC AND ANAEROBIC BLOOD RIGHT FOREARM Performed at [facility], [address] [address] [place], [place] [address]    Culture   Final    NO GROWTH 5 DAYS Performed at [facility], [place] [address], [place], [place] [address]    Report Status 06/17/2021 FINAL  Final  Urine Culture     Status: None   Collection Time: 06/12/21  1:45 PM   Specimen: In/Out Cath Urine  Result Value Ref Range Status   Specimen Description   Final    IN/OUT CATH URINE Performed at [facility], [address] [place], [place] [address]    Special Requests   Final    Immunocompromised Performed at [facility], [address] [place], [place] [address]    Culture   Final    NO GROWTH Performed at [facility], [place] [address] [place], [place] [address]    Report Status 06/14/2021 FINAL  Final  Acid Fast Smear (AFB)     Status: None   Collection Time: 06/12/21  1:45 PM   Specimen: Sputum  Result Value Ref Range Status   AFB Specimen Processing Concentration  Final   Acid Fast Smear Negative  Final    Comment: (NOTE) Performed At: [facility] [address] [place], [place] [id number] Rush Farmer MD [id number]    Source (AFB) SPUTUM  Final    Comment: Performed at [facility], [address] [place], [place] [address]  MTB-RIF NAA with AFB Culture, sputum     Status: None (Preliminary result)   Collection Time: 06/12/21  1:45 PM   Specimen: Sputum  Result Value Ref Range Status   Myco tuberculosis Complex Comment NOT DETECTED  Final    Comment: Mycobacterium tuberculosis complex (MTBC) NOT detected.   Rifampin Comment Susceptible Final    Comment: (NOTE) Because Mycobacterium tuberculosis complex (MTBC) was not detected, no rifampin determination is possible.    AFB Specimen Processing Concentration  Final    Comment: (NOTE) Performed At: [facility] [place], [place] [id number] Rush Farmer MD [id number]    Acid Fast Culture PENDING  Incomplete   Source (MTB RIF) SPUTUM  Final    Comment: Performed at [facility], [place] [address], [place], [place] [address]  Acid Fast Smear (AFB)     Status: None   Collection Time: 06/15/21 12:21 AM   Specimen: Expectorated Sputum  Result Value Ref Range Status   AFB Specimen Processing Concentration  Final   Acid Fast Smear Negative  Final    Comment: (NOTE) Performed At: [facility] [place], [place] [id number] Rush Farmer MD [id number]    Source (AFB) SPUTUM  Final    Comment: Performed at [facility], [place] [address] [place], [place] [address]  MTB-RIF NAA with AFB Culture, sputum     Status: None (Preliminary result)   Collection Time: 06/15/21 12:21 AM   Specimen: Expectorated Sputum  Result Value Ref Range Status   Myco tuberculosis Complex PENDING  Incomplete   Rifampin PENDING  Incomplete   AFB Specimen Processing Concentration  Final    Comment: (NOTE) Performed At: [facility] [place], [place] [id number] Rush Farmer MD [id number]  Acid Fast Culture PENDING  Incomplete   Source (MTB RIF) SPUTUM  Final    Comment: Performed at [facility], [place] [address] [place], [place] [address]  Acid Fast Smear (AFB)     Status: None   Collection Time: 06/16/21  4:16 AM   Specimen: Sputum  Result Value Ref Range Status   AFB Specimen Processing Concentration  Final   Acid Fast Smear Negative  Final    Comment: (NOTE) Performed At: [facility] [place], [place] [id number] Rush Farmer MD [id number]    Source (AFB) SPUTUM  Final    Comment: Performed at [facility], [place] [address], [place], [place] [address]    FURTHER DISCHARGE INSTRUCTIONS:  Get Medicines reviewed and adjusted: Please take all your medications with you for your next visit with your Primary MD  Laboratory/radiological data: Please request your Primary MD to go over all hospital tests and procedure/radiological results at the follow up, please ask your Primary MD to get all Hospital records sent to his/her office.  In some cases, they will be blood work, cultures and biopsy results pending at the time of your discharge. Please request that your primary care M.D. goes through all the records of your hospital data and follows up on these results.  Also Note the following: If you experience worsening of your admission symptoms, develop shortness of breath, life threatening emergency, suicidal or homicidal thoughts you must seek medical attention immediately by calling 911 or calling your MD immediately  if symptoms less severe.  You must read complete instructions/literature along with all the possible adverse reactions/side effects for all the Medicines you take and that have been prescribed to you. Take any new Medicines after you have completely understood and accpet all the possible adverse reactions/side effects.   Do not drive when taking Pain medications or sleeping medications (Benzodaizepines)  Do not take more than prescribed Pain, Sleep and Anxiety Medications. It is not advisable to combine anxiety,sleep and pain medications without talking with your primary care practitioner  Special Instructions: If you have smoked or chewed Tobacco  in the last 2 yrs please stop smoking, stop any regular Alcohol  and or any Recreational drug use.  Wear Seat belts while driving.  Please note: You were cared for by a hospitalist  during your hospital stay. Once you are discharged, your primary care physician will handle any further medical issues. Please note that NO REFILLS for any discharge medications will be authorized once you are discharged, as it is imperative that you return to your primary care physician (or establish a relationship with a primary care physician if you do not have one) for your post hospital discharge needs so that they can reassess your need for medications and monitor your lab values.  Total Time spent coordinating discharge including counseling, education and face to face time equals greater than 30 minutes.  Signed: Orlene Salmons 06/17/2021 10:12 AM

## 2021-06-17 NOTE — Telephone Encounter (Signed)
[facility] Patient Advocate Encounter  Completed and sent [employer] Advancing Access application for Mason Moran for this patient who is uninsured.    Patient is approved 06/15/21 through 06/15/22.  BIN      [id number] PCN    [id number] GRP    [id number] ID        [id number]   Mason Moran, [facility] Phone: [phone] Fax:  [phone]

## 2021-06-17 NOTE — Plan of Care (Signed)

## 2021-06-17 NOTE — TOC Transition Note (Signed)
Transition of Care [facility]) - CM/SW Discharge Note   Patient Details  Name: Mason Moran MRN: [id number] Date of Birth: 1970-08-13  [facility] of [facility]) CM/SW Contact:  Harriet Masson, RN Phone Number: 06/17/2021, 10:31 AM   Clinical Narrative:    Patient stable for discharge. Match letter done, Medications will be delivered to the room by [facility] pharmacy. Patient's sister will transport patient home.   Final next level of care: Home/Self Care Barriers to Discharge: Barriers Resolved   Patient Goals and CMS Choice Patient states their goals for this hospitalization and ongoing recovery are:: return home      Discharge Placement               Sister's home        Discharge Plan and Services   Discharge Planning Services: [facility], Follow-up appt scheduled                                 Social Determinants of Health (SDOH) Interventions     Readmission Risk Interventions Readmission Risk Prevention Plan 06/17/2021  Post Dischage Appt Complete  Medication Screening Complete  Transportation Screening Complete

## 2021-06-17 NOTE — Progress Notes (Signed)
[facility]   Reason for visit: Follow up on HIV, lung abscess, necrotic eye/retinal detachment  Interval History: AFB smear negative x 3; no new complaints. Tmax 101.4.   Day 6 total antibiotics  Physical Exam: Constitutional:  Vitals:   06/17/21 0400 06/17/21 0831  BP: 115/86 106/73  Pulse:    Resp:    Temp: 98 F (36.7 C) 97.8 F (36.6 C)  SpO2: 94% 93%   patient appears in NAD Respiratory: Normal respiratory effort; CTA B Cardiovascular: RRR GI: soft, nt, nd  Review of Systems: Gastrointestinal: negative for nausea and diarrhea Musculoskeletal: negative for myalgias and arthralgias  Lab Results  Component Value Date   WBC 6.4 06/16/2021   HGB 7.3 (L) 06/16/2021   HCT 22.4 (L) 06/16/2021   MCV 83.0 06/16/2021   PLT 528 (H) 06/16/2021    Lab Results  Component Value Date   CREATININE 0.62 06/16/2021   BUN 5 (L) 06/16/2021   NA 134 (L) 06/16/2021   K 4.0 06/16/2021   CL 103 06/16/2021   CO2 25 06/16/2021    Lab Results  Component Value Date   ALT 49 (H) 06/15/2021   AST 30 06/15/2021   ALKPHOS 79 06/15/2021     Microbiology: Recent Results (from the past 240 hour(s))  Expectorated Sputum Assessment w Gram Stain, Rflx to Resp Cult     Status: None   Collection Time: 06/12/21 11:04 AM   Specimen: Sputum  Result Value Ref Range Status   Specimen Description   Final    SPUTUM Performed at [facility], [address] [place], [place] [address]    Special Requests   Final    Immunocompromised Performed at [facility], [address] [place], [place] [address]    Sputum evaluation   Final    THIS SPECIMEN IS ACCEPTABLE FOR SPUTUM CULTURE Performed at [facility], [address] [place], [place] [address]    Report Status 06/12/2021 FINAL  Final  Culture, Respiratory w Gram Stain     Status: None   Collection Time: 06/12/21 11:04 AM   Specimen: SPU  Result Value Ref Range Status    Specimen Description SPUTUM  Final   Special Requests Immunocompromised Reflexed from [id number]  Final   Gram Stain   Final    ABUNDANT WBC PRESENT,BOTH PMN AND MONONUCLEAR ABUNDANT GRAM POSITIVE COCCI FEW GRAM NEGATIVE RODS FEW GRAM POSITIVE RODS    Culture   Final    MODERATE Consistent with normal respiratory flora. Performed at [facility], [address] [place], [place] [address]    Report Status 06/15/2021 FINAL  Final  Resp Panel by RT-PCR (Flu A&B, Covid) Nasopharyngeal Swab     Status: None   Collection Time: 06/12/21 11:30 AM   Specimen: Nasopharyngeal Swab; Nasopharyngeal(NP) swabs in vial transport medium  Result Value Ref Range Status   SARS Coronavirus 2 by RT PCR NEGATIVE NEGATIVE Final    Comment: (NOTE) SARS-CoV-2 target nucleic acids are NOT DETECTED.  The SARS-CoV-2 RNA is generally detectable in upper respiratory specimens during the acute phase of infection. The lowest concentration of SARS-CoV-2 viral copies this assay can detect is 138 copies/mL. A negative result does not preclude SARS-Cov-2 infection and should not be used as the sole basis for treatment or other patient management decisions. A negative result may occur with  improper specimen collection/handling, submission of specimen other than nasopharyngeal swab, presence of  viral mutation(s) within the areas targeted by this assay, and inadequate number of viral copies(<138 copies/mL). A negative result must be combined with clinical observations, patient history, and epidemiological information. The expected result is Negative.  Fact Sheet for Patients:  BloggerCourse.com  Fact Sheet for Healthcare Providers:  SeriousBroker.it  This test is no t yet approved or cleared by the [place] FDA and  has been authorized for detection and/or diagnosis of SARS-CoV-2 by FDA under an Emergency Use Authorization (EUA). This EUA will remain  in  effect (meaning this test can be used) for the duration of the COVID-19 declaration under Section 564(b)(1) of the Act, 21 U.S.C.section 360bbb-3(b)(1), unless the authorization is terminated  or revoked sooner.       Influenza A by PCR NEGATIVE NEGATIVE Final   Influenza B by PCR NEGATIVE NEGATIVE Final    Comment: (NOTE) The Xpert Xpress SARS-CoV-2/FLU/RSV plus assay is intended as an aid in the diagnosis of influenza from Nasopharyngeal swab specimens and should not be used as a sole basis for treatment. Nasal washings and aspirates are unacceptable for Xpert Xpress SARS-CoV-2/FLU/RSV testing.  Fact Sheet for Patients: BloggerCourse.com  Fact Sheet for Healthcare Providers: SeriousBroker.it  This test is not yet approved or cleared by the [place] FDA and has been authorized for detection and/or diagnosis of SARS-CoV-2 by FDA under an Emergency Use Authorization (EUA). This EUA will remain in effect (meaning this test can be used) for the duration of the COVID-19 declaration under Section 564(b)(1) of the Act, 21 U.S.C. section 360bbb-3(b)(1), unless the authorization is terminated or revoked.  Performed at [facility], [address] [place], [place] [address]   Blood Culture (routine x 2)     Status: None   Collection Time: 06/12/21 11:30 AM   Specimen: BLOOD  Result Value Ref Range Status   Specimen Description   Final    BLOOD Blood Culture adequate volume Performed at [facility], [address] [place], [place] [address]    Special Requests   Final    BOTTLES DRAWN AEROBIC AND ANAEROBIC RIGHT ANTECUBITAL Performed at [facility], [address] [place], [place] [address]    Culture   Final    NO GROWTH 5 DAYS Performed at [facility], [address] [place], [place] [address]    Report Status 06/17/2021 FINAL  Final  Blood Culture (routine x 2)     Status:  None   Collection Time: 06/12/21 11:50 AM   Specimen: BLOOD  Result Value Ref Range Status   Specimen Description   Final    BLOOD Blood Culture adequate volume Performed at [facility], [address] [address] [place], [place] [address]    Special Requests   Final    BOTTLES DRAWN AEROBIC AND ANAEROBIC BLOOD RIGHT FOREARM Performed at [facility], [address] [place], [place] [address]    Culture   Final    NO GROWTH 5 DAYS Performed at [facility], [address] [place], [place] [address]    Report Status 06/17/2021 FINAL  Final  Urine Culture     Status: None   Collection Time: 06/12/21  1:45 PM   Specimen: In/Out Cath Urine  Result Value Ref Range Status   Specimen Description   Final    IN/OUT CATH URINE Performed at [facility], [address], [place], [place] [address]  Special Requests   Final    Immunocompromised Performed at [facility], [address] [place], [place] [address]    Culture   Final    NO GROWTH Performed at [facility], [address] [place], [place], [place] [address]    Report Status 06/14/2021 FINAL  Final  Acid Fast Smear (AFB)     Status: None   Collection Time: 06/12/21  1:45 PM   Specimen: Sputum  Result Value Ref Range Status   AFB Specimen Processing Concentration  Final   Acid Fast Smear Negative  Final    Comment: (NOTE) Performed At: [facility] [address] [place], [place] [id number] Jolene Schimke MD [id number]    Source (AFB) SPUTUM  Final    Comment: Performed at [facility], [address] [place], [place] [address]  MTB-RIF NAA with AFB Culture, sputum     Status: None (Preliminary result)   Collection Time: 06/12/21  1:45 PM   Specimen: Sputum  Result Value Ref Range Status   Myco tuberculosis Complex Comment NOT DETECTED Final    Comment: Mycobacterium tuberculosis complex (MTBC) NOT detected.   Rifampin Comment Susceptible Final     Comment: (NOTE) Because Mycobacterium tuberculosis complex (MTBC) was not detected, no rifampin determination is possible.    AFB Specimen Processing Concentration  Final    Comment: (NOTE) Performed At: [facility] [address] [place], [place] [id number] Jolene Schimke MD [id number]    Acid Fast Culture PENDING  Incomplete   Source (MTB RIF) SPUTUM  Final    Comment: Performed at [facility], [address] [place], [place] [address]  Acid Fast Smear (AFB)     Status: None   Collection Time: 06/15/21 12:21 AM   Specimen: Expectorated Sputum  Result Value Ref Range Status   AFB Specimen Processing Concentration  Final   Acid Fast Smear Negative  Final    Comment: (NOTE) Performed At: [facility] [address] [place], [place] [id number] Jolene Schimke MD [id number]    Source (AFB) SPUTUM  Final    Comment: Performed at [facility], [address], [place], [place] [address]  MTB-RIF NAA with AFB Culture, sputum     Status: None (Preliminary result)   Collection Time: 06/15/21 12:21 AM   Specimen: Expectorated Sputum  Result Value Ref Range Status   Myco tuberculosis Complex PENDING  Incomplete   Rifampin PENDING  Incomplete   AFB Specimen Processing Concentration  Final    Comment: (NOTE) Performed At: [facility] [address] [place], [place] [id number] Jolene Schimke MD [id number]    Acid Fast Culture PENDING  Incomplete   Source (MTB RIF) SPUTUM  Final    Comment: Performed at [facility], [address], [place], [place] [address]  Acid Fast Smear (AFB)     Status: None   Collection Time: 06/16/21  4:16 AM   Specimen: Sputum  Result Value Ref Range Status   AFB Specimen Processing Concentration  Final   Acid Fast Smear Negative  Final    Comment: (NOTE) Performed At: [facility] [address] [place], [place] [id number] Jolene Schimke MD [id number]    Source (AFB) SPUTUM  Final     Comment: Performed at [facility], 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  He will continue with valacyclovir at discharge for prophylaxis of his good eye.  He will need follow up with the retinal specialist Dr. Arlys JohnNathan Haines, per Dr. Dione BoozeGroat.   2.  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  He was started on Biktarvy here and has a supply for discharge, approval for one year, until he gets his HMAP paperwork done in clinic.   Continue Biktarvy  3.  OI prophylaxis - he will continue with Bactrim prophylaxis.  A supply was provided for him for discharge  4.  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   5.  Follow up - he has follow up with me arranged.  Will get him bus passes

## 2021-06-20 LAB — CMV DNA, QUANTITATIVE, PCR
CMV DNA Quant: NEGATIVE IU/mL
Log10 CMV Qn DNA Pl: UNDETERMINED log10 IU/mL

## 2021-07-02 ENCOUNTER — Encounter: Payer: Self-pay | Admitting: Internal Medicine

## 2021-07-02 ENCOUNTER — Ambulatory Visit (INDEPENDENT_AMBULATORY_CARE_PROVIDER_SITE_OTHER): Payer: Self-pay | Admitting: Internal Medicine

## 2021-07-02 ENCOUNTER — Other Ambulatory Visit: Payer: Self-pay

## 2021-07-02 VITALS — BP 139/89 | HR 112 | Temp 98.5°F | Wt 163.0 lb

## 2021-07-02 DIAGNOSIS — A419 Sepsis, unspecified organism: Secondary | ICD-10-CM

## 2021-07-02 DIAGNOSIS — Z113 Encounter for screening for infections with a predominantly sexual mode of transmission: Secondary | ICD-10-CM

## 2021-07-02 DIAGNOSIS — H3092 Unspecified chorioretinal inflammation, left eye: Secondary | ICD-10-CM

## 2021-07-02 DIAGNOSIS — Z79899 Other long term (current) drug therapy: Secondary | ICD-10-CM | POA: Insufficient documentation

## 2021-07-02 DIAGNOSIS — B20 Human immunodeficiency virus [HIV] disease: Secondary | ICD-10-CM

## 2021-07-02 DIAGNOSIS — J189 Pneumonia, unspecified organism: Secondary | ICD-10-CM

## 2021-07-02 MED ORDER — BICTEGRAVIR-EMTRICITAB-TENOFOV 50-200-25 MG PO TABS: 1.0000 | ORAL_TABLET | Freq: Every day | ORAL | 11 refills | Status: DC

## 2021-07-02 MED ORDER — SULFAMETHOXAZOLE-TRIMETHOPRIM 800-160 MG PO TABS: 1.0000 | ORAL_TABLET | Freq: Every day | ORAL | 5 refills | Status: DC

## 2021-07-02 MED ORDER — VALACYCLOVIR HCL 500 MG PO TABS: 500.0000 mg | ORAL_TABLET | Freq: Two times a day (BID) | ORAL | 1 refills | Status: DC

## 2021-07-02 NOTE — Assessment & Plan Note (Signed)
He was seen by Dr. Dione Booze and stable.  I will have him continue with valacyclovir while he immune reconstitutes.

## 2021-07-02 NOTE — Progress Notes (Signed)
° °  Subjective:    Patient ID: Mason Moran, male    DOB: [birth year], 51 y.o.   MRN: [id number]  HPI Here for hsfu He was hospitalized earlier this month with pneumonia and uncontrolled HIV and blindness in his left eye.  He was seen by Dr. 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  I put him on oral valacyclovir for prophylaxis of his other eye.  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  His CD4 was 37 and viral load 315,000.  He had previously been on Biktaryv and this was restarted during his hospitalization.  He has continued on this.  He saw Dr. Dione Booze earlier this week and told he is expected to some continued blurry vision from his good, right eye.  He has gained about 30 lbs.  He is having some loose stools and some numbness and tingling of his toes bilateral.     Review of Systems  Constitutional:  Negative for fatigue and unexpected weight change.  Gastrointestinal:  Negative for nausea.  Skin:  Negative for rash.      Objective:   Physical Exam Eyes:     General: No scleral icterus. Cardiovascular:     Rate and Rhythm: Regular rhythm. Tachycardia present.     Heart sounds: No murmur heard. Pulmonary:     Effort: Pulmonary effort is normal. No respiratory distress.     Breath sounds: No wheezing or rales.  Neurological:     General: No focal deficit present.     Mental Status: He is alert.  Psychiatric:        Mood and Affect: Mood normal.   SH: quit smoking       Assessment & Plan:

## 2021-07-02 NOTE — Assessment & Plan Note (Signed)
Much improved pneumonia.  He is having some sob and seems to be deconditioned.  No wheezes, no fever or other concerns.

## 2021-07-02 NOTE — Assessment & Plan Note (Addendum)
He seems to be doing better on his medication with good weight gain.  Will check his CD4 count and viral load today and he knows he can get refills with [facility] until his HMAP is renewed.  Will have him return in about 2 months to recheck.  Bus passes offered.   I have personally spent 45 minutes involved in face-to-face and non-face-to-face activities for this patient on the day of the visit. Professional time spent includes the following activities: Preparing to see the patient (review of tests), Obtaining and/or reviewing separately obtained history (admission/discharge record), Performing a medically appropriate examination and/or evaluation , Ordering medications/tests/procedures, referring and communicating with other health care professionals, Documenting clinical information in the EMR, Independently interpreting results (not separately reported), Communicating results to the patient/family/caregiver, Counseling and educating the patient/family/caregiver and Care coordination (not separately reported).

## 2021-07-03 ENCOUNTER — Encounter: Payer: Self-pay | Admitting: Internal Medicine

## 2021-07-04 LAB — CBC WITH DIFFERENTIAL/PLATELET
Absolute Monocytes: 787 cells/uL (ref 200–950)
Basophils Absolute: 49 cells/uL (ref 0–200)
Basophils Relative: 0.6 %
Eosinophils Absolute: 1419 cells/uL — ABNORMAL HIGH (ref 15–500)
Eosinophils Relative: 17.3 %
HCT: 30.4 % — ABNORMAL LOW (ref 38.5–50.0)
Hemoglobin: 9.6 g/dL — ABNORMAL LOW (ref 13.2–17.1)
Lymphs Abs: 1115 cells/uL (ref 850–3900)
MCH: 28.7 pg (ref 27.0–33.0)
MCHC: 31.6 g/dL — ABNORMAL LOW (ref 32.0–36.0)
MCV: 90.7 fL (ref 80.0–100.0)
MPV: 9.7 fL (ref 7.5–12.5)
Monocytes Relative: 9.6 %
Neutro Abs: 4830 cells/uL (ref 1500–7800)
Neutrophils Relative %: 58.9 %
Platelets: 413 10*3/uL — ABNORMAL HIGH (ref 140–400)
RBC: 3.35 10*6/uL — ABNORMAL LOW (ref 4.20–5.80)
RDW: 18.4 % — ABNORMAL HIGH (ref 11.0–15.0)
Total Lymphocyte: 13.6 %
WBC: 8.2 10*3/uL (ref 3.8–10.8)

## 2021-07-04 LAB — LIPID PANEL
Cholesterol: 126 mg/dL (ref <200)
HDL: 54 mg/dL (ref >=40)
LDL Cholesterol (Calc): 56 mg/dL (calc)
Non-HDL Cholesterol (Calc): 72 mg/dL (calc) (ref <130)
Total CHOL/HDL Ratio: 2.3 (calc) (ref <5.0)
Triglycerides: 82 mg/dL (ref <150)

## 2021-07-04 LAB — C. TRACHOMATIS/N. GONORRHOEAE RNA
C. trachomatis RNA, TMA: NOT DETECTED
N. gonorrhoeae RNA, TMA: NOT DETECTED

## 2021-07-04 LAB — COMPLETE METABOLIC PANEL WITH GFR
AG Ratio: 0.8 (calc) — ABNORMAL LOW (ref 1.0–2.5)
ALT: 20 U/L (ref 9–46)
AST: 16 U/L (ref 10–35)
Albumin: 3.6 g/dL (ref 3.6–5.1)
Alkaline phosphatase (APISO): 101 U/L (ref 35–144)
BUN: 11 mg/dL (ref 7–25)
CO2: 29 mmol/L (ref 20–32)
Calcium: 9.4 mg/dL (ref 8.6–10.3)
Chloride: 103 mmol/L (ref 98–110)
Creat: 0.86 mg/dL (ref 0.70–1.30)
Globulin: 4.4 g/dL (calc) — ABNORMAL HIGH (ref 1.9–3.7)
Glucose, Bld: 86 mg/dL (ref 65–99)
Potassium: 4.3 mmol/L (ref 3.5–5.3)
Sodium: 139 mmol/L (ref 135–146)
Total Bilirubin: 0.2 mg/dL (ref 0.2–1.2)
Total Protein: 8 g/dL (ref 6.1–8.1)
eGFR: 105 mL/min/{1.73_m2} (ref >=60)

## 2021-07-04 LAB — T-HELPER CELLS (CD4) COUNT (NOT AT ARMC)
Absolute CD4: 163 cells/uL — ABNORMAL LOW (ref 490–1740)
CD4 T Helper %: 14 % — ABNORMAL LOW (ref 30–61)
Total lymphocyte count: 1160 cells/uL (ref 850–3900)

## 2021-07-04 LAB — HIV-1 RNA QUANT-NO REFLEX-BLD
HIV 1 RNA Quant: 197 Copies/mL — ABNORMAL HIGH
HIV-1 RNA Quant, Log: 2.29 Log cps/mL — ABNORMAL HIGH

## 2021-07-04 LAB — RPR: RPR Ser Ql: NONREACTIVE

## 2021-07-06 ENCOUNTER — Telehealth: Payer: Self-pay

## 2021-07-06 NOTE — Telephone Encounter (Signed)
Spoke with patient, relayed that his CD4 count is improving and viral load is almost suppressed. Encouraged him to keep up the good work in taking his medications daily. Patient verbalized understanding and has no further questions.   Sandie Ano, RN

## 2021-07-06 NOTE — Telephone Encounter (Signed)
-----   Message from Gardiner Barefoot, MD sent at 07/06/2021  8:56 AM EST ----- If you could let him know of his recent test results.  His CD4 is coming up nicely, viral load nearly suppressed. thanks

## 2021-07-21 ENCOUNTER — Ambulatory Visit
Admission: RE | Admit: 2021-07-21 | Discharge: 2021-07-21 | Disposition: A | Discharge status: Home / Self Care | Payer: Self-pay | Source: Ambulatory Visit | Attending: Family Medicine | Admitting: Family Medicine

## 2021-07-21 ENCOUNTER — Other Ambulatory Visit: Payer: Self-pay

## 2021-07-21 ENCOUNTER — Ambulatory Visit: Payer: Self-pay | Attending: Family Medicine | Admitting: Family Medicine

## 2021-07-21 ENCOUNTER — Encounter: Payer: Self-pay | Admitting: Family Medicine

## 2021-07-21 VITALS — BP 126/75 | HR 91 | Ht 72.0 in | Wt 184.0 lb

## 2021-07-21 DIAGNOSIS — J189 Pneumonia, unspecified organism: Secondary | ICD-10-CM

## 2021-07-21 DIAGNOSIS — G629 Polyneuropathy, unspecified: Secondary | ICD-10-CM

## 2021-07-21 DIAGNOSIS — Z131 Encounter for screening for diabetes mellitus: Secondary | ICD-10-CM

## 2021-07-21 DIAGNOSIS — B20 Human immunodeficiency virus [HIV] disease: Secondary | ICD-10-CM

## 2021-07-21 DIAGNOSIS — R21 Rash and other nonspecific skin eruption: Secondary | ICD-10-CM

## 2021-07-21 DIAGNOSIS — A419 Sepsis, unspecified organism: Secondary | ICD-10-CM

## 2021-07-21 LAB — POCT GLYCOSYLATED HEMOGLOBIN (HGB A1C): Hemoglobin A1C: 4.8 % (ref 4.0–5.6)

## 2021-07-21 MED ORDER — GABAPENTIN 300 MG PO CAPS
300.0000 mg | ORAL_CAPSULE | Freq: Every day | ORAL | 3 refills | Status: DC
  Filled 2021-07-21: qty 30, 30d supply, fill #0

## 2021-07-21 NOTE — Progress Notes (Signed)
Has rash on buttocks. ?Allergies ?Soft stool ?Numbness in feet. ?

## 2021-07-21 NOTE — Progress Notes (Signed)
? ?Subjective:  ?Patient ID: Mason Moran, male    DOB: January 23, 1971  Age: 51 y.o. MRN: [id number] ? ?CC: Hospitalization Follow-up ? ? ?HPI ?Mason Moran is a 51 y.o. 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 ?He had a hospitalization last month for sepsis and acute respiratory failure secondary to community-acquired pneumonia of right lung.  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 ?Blood culture revealed no growth, sputum AFB was negative. ?He was treated with Unasyn which was transitioned to Augmentin at discharge.  He did receive 1 unit of PRBC during his hospital course. ?CT chest revealed: ?IMPRESSION: ?There is large infiltrate in the posterior right lower lobe ?suggesting pneumonia. There are multiple loculated fluid collections ?with air-fluid levels within this infiltrate. Findings suggest ?necrotic pneumonia with multiple lung abscesses largest measuring ?7.2 cm in diameter. Small right pleural effusion is present. There ?is no pneumothorax. ?  ?Small patchy infiltrate is seen in the subpleural location in the ?posterior left lower lobe suggesting atelectasis/pneumonia. Linear ?density in the right apex may suggest scarring. ?  ?Small pericardial effusion is present. ?  ?These results will be called to the ordering clinician or ?representative by the Radiologist Assistant, and communication ?documented in the PACS or [employer]. ?  ?  ?Interval History: ?His last visit with infectious disease was last month. ?He states he has difficulty obtaining his antiretroviral and they are giving him the run around. ?He continues to experience some dyspnea and at his visit with infectious disease this was attributed to deconditioning. ? ?Complains of 1 months history of numbness and  tingling of the feet and can only feel his big toes, he is also off balance. 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 ? ?He has loose bowel which is soft but not diarrhea ? ?He has bumps on his butttock which improved with hydrocortisone. . When he pops the lesion they feel better. Lesions of fluid contained in the itch. ? ?Past Medical History:  ?Diagnosis Date  ? HIV (human immunodeficiency virus infection) (HCC)   ? ? ?No past surgical history on file. ? ?No family history on file. ? ?Social History  ? ?Socioeconomic History  ? Marital status: Single  ?  Spouse name: Not on file  ? Number of children: Not on file  ? Years of education: Not on file  ? Highest education level: Not on file  ?Occupational History  ? Not on file  ?Tobacco Use  ? Smoking status: Never  ? Smokeless tobacco: Never  ?Vaping Use  ? Vaping Use: Never used  ?Substance and Sexual Activity  ? Alcohol use: Not Currently  ?  Comment: none in a month  ? Drug use: Not Currently  ?  Comment: none in the past month  ? Sexual activity: Not on file  ?Other Topics Concern  ? Not on file  ?Social History Narrative  ? Not on file  ? ?Social Determinants of Health  ? ?Financial Resource Strain: Not on file  ?Food Insecurity: Not on file  ?Transportation Needs: Not on file  ?Physical Activity: Not on file  ?Stress: Not on file  ?Social Connections: Not on file  ? ? ?Allergies  ?Allergen Reactions  ? Dapsone Other (See Comments)  ?  Other reaction(s):  Other (See Comments) ?G6PD deficiency  ? Other Other (See Comments)  ?  Seafood - can't speak  ? Primaquine   ?  Other reaction(s): Other (See Comments) ?G6PD deficiency  ? ? ?Outpatient Medications Prior to Visit  ?Medication Sig Dispense Refill  ? bictegravir-emtricitabine-tenofovir AF (BIKTARVY) 50-200-25 MG TABS tablet Take 1 tablet by mouth daily. 30 tablet 11  ? sulfamethoxazole-trimethoprim (BACTRIM DS) 800-160 MG tablet Take 1 tablet by mouth daily. 30 tablet 5  ?  valACYclovir (VALTREX) 500 MG tablet Take 1 tablet (500 mg total) by mouth 2 (two) times daily. 60 tablet 1  ? vitamin B-12 (CYANOCOBALAMIN) 1000 MCG tablet Take 1 tablet (1,000 mcg total) by mouth daily. 30 tablet 2  ? ?No facility-administered medications prior to visit.  ? ? ? ?ROS ?Review of Systems  ?Constitutional:  Negative for activity change and appetite change.  ?HENT:  Negative for sinus pressure and sore throat.   ?Eyes:  Positive for visual disturbance.  ?Respiratory:  Negative for cough, chest tightness and shortness of breath.   ?Cardiovascular:  Negative for chest pain and leg swelling.  ?Gastrointestinal:  Negative for abdominal distention, abdominal pain, constipation and diarrhea.  ?Endocrine: Negative.   ?Genitourinary:  Negative for dysuria.  ?Musculoskeletal:  Negative for joint swelling and myalgias.  ?Skin:  Positive for rash.  ?Allergic/Immunologic: Negative.   ?Neurological:  Positive for numbness. Negative for weakness and light-headedness.  ?Psychiatric/Behavioral:  Negative for dysphoric mood and suicidal ideas.   ? ?Objective:  ?BP 126/75   Pulse 91   Ht 6' (1.829 m)   Wt 184 lb (83.5 kg)   SpO2 98%   BMI 24.95 kg/m?  ? ?BP/Weight 07/21/2021 07/02/2021 06/17/2021  ?Systolic BP 126 139 106  ?Diastolic BP 75 89 73  ?Wt. (Lbs) 184 163 -  ?BMI 24.95 22.11 -  ? ? ? ? ?Physical Exam ?Constitutional:   ?   Appearance: He is well-developed.  ?Cardiovascular:  ?   Rate and Rhythm: Normal rate.  ?   Heart sounds: Normal heart sounds. No murmur heard. ?Pulmonary:  ?   Effort: Pulmonary effort is normal.  ?   Breath sounds: Normal breath sounds. No wheezing or rales.  ?Chest:  ?   Chest wall: No tenderness.  ?Abdominal:  ?   General: Bowel sounds are normal. There is no distension.  ?   Palpations: Abdomen is soft. There is no mass.  ?   Tenderness: There is no abdominal tenderness.  ?Musculoskeletal:     ?   General: Normal range of motion.  ?   Right lower leg: No edema.  ?   Left lower leg: No  edema.  ?Skin: ?   Comments: Nodules in the medial aspect of bilateral gluteal cleft with no discharge  ?Neurological:  ?   Mental Status: He is alert and oriented to person, place, and time.  ?Psychiatric:     ?   Mood and Affect: Mood normal.  ? ? ?CMP Latest Ref Rng & Units 07/02/2021 06/16/2021 06/15/2021  ?Glucose 65 - 99 mg/dL 86 161(W102(H) 95  ?BUN 7 - 25 mg/dL 11 5(L) 6  ?Creatinine 0.70 - 1.30 mg/dL 9.600.86 4.540.62 0.980.68  ?Sodium 135 - 146 mmol/L 139 134(L) 133(L)  ?Potassium 3.5 - 5.3 mmol/L 4.3 4.0 3.3(L)  ?Chloride 98 - 110 mmol/L 103 103 99  ?CO2 20 - 32 mmol/L 29 25 24   ?Calcium 8.6 - 10.3 mg/dL 9.4 7.3(L) 7.3(L)  ?Total Protein 6.1 - 8.1 g/dL 8.0 -  6.0(L)  ?Total Bilirubin 0.2 - 1.2 mg/dL 0.2 - 0.4  ?Alkaline Phos 38 - 126 U/L - - 79  ?AST 10 - 35 U/L 16 - 30  ?ALT 9 - 46 U/L 20 - 49(H)  ? ? ?Lipid Panel  ?   ?Component Value Date/Time  ? CHOL 126 07/02/2021 1603  ? TRIG 82 07/02/2021 1603  ? HDL 54 07/02/2021 1603  ? CHOLHDL 2.3 07/02/2021 1603  ? LDLCALC 56 07/02/2021 1603  ? ? ?CBC ?   ?Component Value Date/Time  ? WBC 8.2 07/02/2021 1603  ? RBC 3.35 (L) 07/02/2021 1603  ? HGB 9.6 (L) 07/02/2021 1603  ? HCT 30.4 (L) 07/02/2021 1603  ? PLT 413 (H) 07/02/2021 1603  ? MCV 90.7 07/02/2021 1603  ? MCH 28.7 07/02/2021 1603  ? MCHC 31.6 (L) 07/02/2021 1603  ? RDW 18.4 (H) 07/02/2021 1603  ? LYMPHSABS 1,115 07/02/2021 1603  ? MONOABS 0.6 06/12/2021 1130  ? EOSABS 1,419 (H) 07/02/2021 1603  ? BASOSABS 49 07/02/2021 1603  ? ? ?Lab Results  ?Component Value Date  ? HGBA1C 4.8 07/21/2021  ? ? ?Assessment & Plan:  ?1. Sepsis due to RLL lung abscess/necrotizing pneumonia ?Completed course of Augmentin ?We will follow-up with chest x-ray ?- DG Chest 2 View; Future ? ?2. Symptomatic HIV infection (HCC) ?Last CD4 count was 163 from 06/2021 ?Due to medication nonadherence ?He has been advised to stop by his infectious disease clinic to inform them of his difficulty obtaining his antiretroviral therapy ? ?3. Rash ?Unfortunately he  continues to have opportunistic infections due to his immunocompromise status and nonadherence with his antiretroviral therapy ?Lesions are improving with hydrocortisone ?He has been advised to discuss this with his infe

## 2021-07-22 ENCOUNTER — Other Ambulatory Visit: Payer: Self-pay

## 2021-07-22 LAB — CBC WITH DIFFERENTIAL/PLATELET
Basophils Absolute: 0 10*3/uL (ref 0.0–0.2)
Basos: 1 %
EOS (ABSOLUTE): 1.1 10*3/uL — ABNORMAL HIGH (ref 0.0–0.4)
Eos: 17 %
Hematocrit: 36.9 % — ABNORMAL LOW (ref 37.5–51.0)
Hemoglobin: 12.3 g/dL — ABNORMAL LOW (ref 13.0–17.7)
Immature Grans (Abs): 0.1 10*3/uL (ref 0.0–0.1)
Immature Granulocytes: 1 %
Lymphocytes Absolute: 1.7 10*3/uL (ref 0.7–3.1)
Lymphs: 26 %
MCH: 29.9 pg (ref 26.6–33.0)
MCHC: 33.3 g/dL (ref 31.5–35.7)
MCV: 90 fL (ref 79–97)
Monocytes Absolute: 0.7 10*3/uL (ref 0.1–0.9)
Monocytes: 11 %
Neutrophils Absolute: 3 10*3/uL (ref 1.4–7.0)
Neutrophils: 44 %
Platelets: 277 10*3/uL (ref 150–450)
RBC: 4.11 x10E6/uL — ABNORMAL LOW (ref 4.14–5.80)
RDW: 17.7 % — ABNORMAL HIGH (ref 11.6–15.4)
WBC: 6.5 10*3/uL (ref 3.4–10.8)

## 2021-07-22 LAB — VITAMIN B12: Vitamin B-12: 634 pg/mL (ref 232–1245)

## 2021-07-24 ENCOUNTER — Other Ambulatory Visit: Payer: Self-pay | Admitting: Family Medicine

## 2021-07-24 DIAGNOSIS — A419 Sepsis, unspecified organism: Secondary | ICD-10-CM

## 2021-07-24 LAB — ACID FAST CULTURE WITH REFLEXED SENSITIVITIES (MYCOBACTERIA): Acid Fast Culture: POSITIVE — AB

## 2021-07-24 LAB — AFB ID BY DNA PROBE
M avium complex: NEGATIVE
M kansasii: POSITIVE — AB
M tuberculosis complex: NEGATIVE

## 2021-07-24 LAB — MTB-RIF NAA WITH AFB CULTURE, SPUTUM: Acid Fast Culture: POSITIVE — AB

## 2021-07-24 LAB — AFB ORGANISM ID BY DNA PROBE
M avium complex: NEGATIVE
M gordonae: NEGATIVE
M kansasii: POSITIVE — AB
M tuberculosis complex: NEGATIVE

## 2021-07-25 LAB — ACID FAST CULTURE WITH REFLEXED SENSITIVITIES (MYCOBACTERIA): Acid Fast Culture: NEGATIVE

## 2021-07-28 LAB — MTB-RIF NAA WITH AFB CULTURE, SPUTUM: Acid Fast Culture: NEGATIVE

## 2021-07-29 LAB — SLOW GROWER BROTH SUSCEP.
Amikacin: 4
Ciprofloxacin: 4
Doxycycline: >8
Rifampin: 0.5
Streptomycin: 8

## 2021-07-29 LAB — AFB ORGANISM ID BY DNA PROBE
M avium complex: NEGATIVE
M gordonae: NEGATIVE
M kansasii: POSITIVE — AB
M tuberculosis complex: NEGATIVE

## 2021-07-29 LAB — ACID FAST CULTURE WITH REFLEXED SENSITIVITIES (MYCOBACTERIA): Acid Fast Culture: POSITIVE — AB

## 2021-09-10 ENCOUNTER — Other Ambulatory Visit: Payer: Self-pay | Admitting: Internal Medicine

## 2021-09-10 DIAGNOSIS — B20 Human immunodeficiency virus [HIV] disease: Secondary | ICD-10-CM

## 2021-09-10 NOTE — Telephone Encounter (Signed)
Appointment 5/11 ?

## 2021-09-17 ENCOUNTER — Ambulatory Visit: Payer: Self-pay | Admitting: Internal Medicine

## 2021-09-18 ENCOUNTER — Ambulatory Visit: Payer: Self-pay | Admitting: Internal Medicine

## 2021-10-09 ENCOUNTER — Ambulatory Visit: Payer: Self-pay | Admitting: Nurse Practitioner

## 2021-11-22 ENCOUNTER — Other Ambulatory Visit: Payer: Self-pay | Admitting: Infectious Diseases

## 2021-11-22 DIAGNOSIS — B20 Human immunodeficiency virus [HIV] disease: Secondary | ICD-10-CM

## 2021-12-31 ENCOUNTER — Ambulatory Visit: Payer: Self-pay

## 2021-12-31 NOTE — Telephone Encounter (Signed)
  Chief Complaint: boils Symptoms: boils in clusters under L armpit and buttocks area Frequency: ongoing for several weeks  Pertinent Negatives: NA Disposition: [] ED /[] Urgent Care (no appt availability in office) / [] Appointment(In office/virtual)/ []  [facility]/ [] Home Care/ [] Refused Recommended Disposition /[x] [facility]/ []  Follow-up with PCP Additional Notes: pt states he had them when first coming to see Dr. and was taking amoxicillin and went away but now coming back. He has been applying some ointment he bought OTC but unsure if he can use on buttocks area. I advised him to read bottle carefully and as long as not inserting into rectum should be able to apply on buttocks skin. Pt needs transportation assistance so gave address for Mobile unit on 01/05/22. Pt was going to go ahead and call transportation. Also scheduled OV for 01/20/22 at 0950 with , PA.   Reason for Disposition  2 or more boils  Answer Assessment - Initial Assessment Questions 1. ONSET: "When did the pain start?"      2 weeks  2. LOCATION: "Where is the pain located?"      L foot  3. PAIN: "How bad is the pain?"    (Scale 1-10; or mild, moderate, severe)  - MILD (1-3): doesn't interfere with normal activities.   - MODERATE (4-7): interferes with normal activities (e.g., work or school) or awakens from sleep, limping.   - SEVERE (8-10): excruciating pain, unable to do any normal activities, unable to walk.      6 6. OTHER SYMPTOMS: "Do you have any other symptoms?" (e.g., leg pain, rash, fever, numbness)     Unable to keep shoe on, numbness  Answer Assessment - Initial Assessment Questions 1. APPEARANCE of BOIL: "What does the boil look like?"      Cluster of boils  2. LOCATION: "Where is the boil located?"      In buttocks, L armpit  3. NUMBER: "How many boils are there?"      Clusters of 3  4. SIZE: "How big is the boil?" (e.g., inches, cm; compare to size of a coin or  other object)     1/2 size of fingernail  5. ONSET: "When did the boil start?"     Ongoing for several weeks  6. PAIN: "Is there any pain?" If Yes, ask: "How bad is the pain?"   (Scale 1-10; or mild, moderate, severe)     7 9. OTHER SYMPTOMS: "Do you have any other symptoms?" (e.g., shaking chills, weakness, rash elsewhere on body)  Protocols used: Foot Pain-A-AH, Boil (Skin Abscess)-A-AH

## 2022-01-12 ENCOUNTER — Other Ambulatory Visit: Payer: Self-pay

## 2022-01-12 ENCOUNTER — Ambulatory Visit: Payer: Self-pay

## 2022-01-12 ENCOUNTER — Encounter: Payer: Self-pay | Admitting: Internal Medicine

## 2022-01-12 ENCOUNTER — Ambulatory Visit (INDEPENDENT_AMBULATORY_CARE_PROVIDER_SITE_OTHER): Payer: Self-pay | Admitting: Internal Medicine

## 2022-01-12 VITALS — HR 66 | Temp 98.2°F | Ht 72.0 in | Wt 193.0 lb

## 2022-01-12 DIAGNOSIS — H3092 Unspecified chorioretinal inflammation, left eye: Secondary | ICD-10-CM

## 2022-01-12 DIAGNOSIS — G609 Hereditary and idiopathic neuropathy, unspecified: Secondary | ICD-10-CM

## 2022-01-12 DIAGNOSIS — B20 Human immunodeficiency virus [HIV] disease: Secondary | ICD-10-CM

## 2022-01-12 DIAGNOSIS — G629 Polyneuropathy, unspecified: Secondary | ICD-10-CM | POA: Insufficient documentation

## 2022-01-12 MED ORDER — BICTEGRAVIR-EMTRICITAB-TENOFOV 50-200-25 MG PO TABS: 1.0000 | ORAL_TABLET | Freq: Every day | ORAL | 11 refills | Status: DC

## 2022-01-12 NOTE — Progress Notes (Signed)
   Subjective:    Patient ID: Mason Moran, male    DOB: 1970/05/13, 51 y.o.   MRN: [id number]  HPI He is here for follow up of HIV and history of retinal necrosis.   He was last seen in February this year and improving HiV viral load CD4 of 163.  He missed follow up since that time.  He has not been back to Dr. Dione Booze and did not go see the retinal specialist.  He continues to take [facility] and has had no missed doses.  Tolerates it well.  Continues to have pain with neuropathy of both feet.  Denies recent sexual activity.    Review of Systems  Constitutional:  Negative for fatigue.  Gastrointestinal:  Negative for diarrhea and nausea.  Skin:  Negative for rash.       Objective:   Physical Exam Eyes:     General: No scleral icterus. Pulmonary:     Effort: Pulmonary effort is normal.  Skin:    Findings: No rash.  Neurological:     General: No focal deficit present.     Mental Status: He is alert.     Coordination: Coordination abnormal.   SH: No tobacco        Assessment & Plan:

## 2022-01-12 NOTE — Assessment & Plan Note (Signed)
He can continue with gabapentin for treatment.  I discussed with him that his treatment for HIV will reduce the chance for progression.

## 2022-01-12 NOTE — Assessment & Plan Note (Signed)
He appears to be doing well with no concerns today.  I will check his labs to see how his CD4 and viral load are now and have him continue with Biktarvy.  Follow up in 3-4 months.

## 2022-01-12 NOTE — Assessment & Plan Note (Addendum)
His eye is stable, no new issues with his right eye, he can see clearly.  He can stop treatment if his CD4 has normalized.  I have encouraged follow up with ophthalmology.

## 2022-01-13 LAB — T-HELPER CELL (CD4) - (RCID CLINIC ONLY)
CD4 % Helper T Cell: 13 % — ABNORMAL LOW (ref 33–65)
CD4 T Cell Abs: 196 /uL — ABNORMAL LOW (ref 400–1790)

## 2022-01-14 LAB — HIV-1 RNA QUANT-NO REFLEX-BLD
HIV 1 RNA Quant: NOT DETECTED Copies/mL
HIV-1 RNA Quant, Log: NOT DETECTED Log cps/mL

## 2022-01-20 ENCOUNTER — Encounter: Payer: Self-pay | Admitting: Physician Assistant

## 2022-01-20 ENCOUNTER — Ambulatory Visit: Payer: Medicaid Other | Attending: Physician Assistant | Admitting: Physician Assistant

## 2022-01-20 VITALS — BP 133/96 | HR 88 | Ht 72.0 in | Wt 189.6 lb

## 2022-01-20 DIAGNOSIS — L0293 Carbuncle, unspecified: Secondary | ICD-10-CM

## 2022-01-20 DIAGNOSIS — G629 Polyneuropathy, unspecified: Secondary | ICD-10-CM

## 2022-01-20 MED ORDER — DOXYCYCLINE HYCLATE 100 MG PO TABS: 100.0000 mg | ORAL_TABLET | Freq: Two times a day (BID) | ORAL | 0 refills | Status: DC

## 2022-01-20 MED ORDER — MUPIROCIN 2 % EX OINT: 1.0000 | TOPICAL_OINTMENT | Freq: Two times a day (BID) | CUTANEOUS | 2 refills | Status: DC

## 2022-01-20 MED ORDER — GABAPENTIN 300 MG PO CAPS: ORAL_CAPSULE | ORAL | 3 refills | Status: DC

## 2022-01-20 NOTE — Patient Instructions (Signed)
Skin Abscess  A skin abscess is an infected area of your skin that contains pus and other material. An abscess can happen in any part of your body. Some abscesses break open (rupture) on their own. Most continue to get worse unless they are treated. The infection can spread deeper into the body and into your blood, which can make you feel sick. A skin abscess is caused by germs that enter the skin through a cut or scrape. It can also be caused by blocked oil and sweat glands or infected hair follicles. This condition is usually treated by: Draining the pus. Taking antibiotic medicines. Placing a warm, wet washcloth over the abscess. Follow these instructions at home: Medicines  Take over-the-counter and prescription medicines only as told by your doctor. If you were prescribed an antibiotic medicine, take it as told by your doctor. Do not stop taking the antibiotic even if you start to feel better. Abscess care  If you have an abscess that has not drained, place a warm, clean, wet washcloth over the abscess several times a day. Do this as told by your doctor. Follow instructions from your doctor about how to take care of your abscess. Make sure you: Cover the abscess with a bandage (dressing). Change your bandage or gauze as told by your doctor. Wash your hands with soap and water before you change the bandage or gauze. If you cannot use soap and water, use hand sanitizer. Check your abscess every day for signs that the infection is getting worse. Check for: More redness, swelling, or pain. More fluid or blood. Warmth. More pus or a bad smell. General instructions To avoid spreading the infection: Do not share personal care items, towels, or hot tubs with others. Avoid making skin-to-skin contact with other people. Keep all follow-up visits as told by your doctor. This is important. Contact a doctor if: You have more redness, swelling, or pain around your abscess. You have more fluid  or blood coming from your abscess. Your abscess feels warm when you touch it. You have more pus or a bad smell coming from your abscess. Your muscles ache. You feel sick. Get help right away if: You have very bad (severe) pain. You see red streaks on your skin spreading away from the abscess. You see redness that spreads quickly. You have a fever or chills. Summary A skin abscess is an infected area of your skin that contains pus and other material. The abscess is caused by germs that enter the skin through a cut or scrape. It can also be caused by blocked oil and sweat glands or infected hair follicles. Follow your doctor's instructions on caring for your abscess, taking medicines, preventing infections, and keeping follow-up visits. This information is not intended to replace advice given to you by your health care provider. Make sure you discuss any questions you have with your health care provider. Document Revised: 07/30/2021 Document Reviewed: 02/02/2021 Elsevier Patient Education  2023 Elsevier Inc.  

## 2022-01-20 NOTE — Progress Notes (Signed)
Patient ID: Mason Moran, male   DOB: 02/06/1971, 51 y.o.   MRN: [id number]   Mason Moran, is a 51 y.o. male  VFI:[id number]  [id number]  DOB - 1970/05/26  Chief Complaint  Patient presents with   Foot Pain   BOILS       Subjective:   Mason Moran is a 51 y.o. male here today for worsening neuropathy on his L foot.  On triple therapy for HIV.  He says THC helps some.  Currently taking 300mg  gabapentin at bedtime with minimal relief.    Also has recurrent boils on his buttock and underarms.  No fevers.  This has been ongoing for a long time.  No problems updated.  ALLERGIES: Allergies  Allergen Reactions   Dapsone Other (See Comments)    Other reaction(s): Other (See Comments) G6PD deficiency   Other Other (See Comments)    Seafood - can't speak   Primaquine     Other reaction(s): Other (See Comments) G6PD deficiency    PAST MEDICAL HISTORY: Past Medical History:  Diagnosis Date   HIV (human immunodeficiency virus infection) (HCC)     MEDICATIONS AT HOME: Prior to Admission medications   Medication Sig Start Date End Date Taking? Authorizing Provider  bictegravir-emtricitabine-tenofovir AF (BIKTARVY) 50-200-25 MG TABS tablet Take 1 tablet by mouth daily. 01/12/22  Yes Comer, 03/14/22, MD  doxycycline (VIBRA-TABS) 100 MG tablet Take 1 tablet (100 mg total) by mouth 2 (two) times daily. 01/20/22  Yes 01/22/22, PA-C  mupirocin ointment (BACTROBAN) 2 % Apply 1 Application topically 2 (two) times daily. X 1 week Prn boils 01/20/22  Yes Gurnoor Ursua M, PA-C  valACYclovir (VALTREX) 500 MG tablet TAKE 1 TABLET BY MOUTH TWICE DAILY 11/24/21  Yes 11/26/21, MD  gabapentin (NEURONTIN) 300 MG capsule Take 1 in the morning and 2 at bedtime 01/20/22   Yamilett Anastos, 01/22/22, PA-C    ROS: Neg resp Neg cardiac Neg GI Neg GU Neg MS Neg psych  Objective:   Vitals:   01/20/22 1024  BP: (!) 133/96  Pulse: 88  SpO2: 99%  Weight: 189 lb 9.6 oz (86 kg)  Height:  6' (1.829 m)   Exam General appearance : Awake, alert, not in any distress. Speech Clear. Not toxic looking HEENT: Atraumatic and Normocephalic Neck: Supple, no JVD. No cervical lymphadenopathy.  Chest: Good air entry bilaterally, CTAB.  No rales/rhonchi/wheezing CVS: S1 S2 regular, no murmurs.  R axilla wnl, L axilla with resolving boil  Extremities: B/L Lower Ext shows no edema, both legs are warm to touch.  No sores/wounds on feet Neurology: Awake alert, and oriented X 3, CN II-XII intact, Non focal Skin: No Rash  Data Review Lab Results  Component Value Date   HGBA1C 4.8 07/21/2021    Assessment & Plan   1. Neuropathy Increase dose - gabapentin (NEURONTIN) 300 MG capsule; Take 1 in the morning and 2 at bedtime  Dispense: 90 capsule; Refill: 3  2. Recurrent boils - doxycycline (VIBRA-TABS) 100 MG tablet; Take 1 tablet (100 mg total) by mouth 2 (two) times daily.  Dispense: 20 tablet; Refill: 0 - mupirocin ointment (BACTROBAN) 2 %; Apply 1 Application topically 2 (two) times daily. X 1 week Prn boils  Dispense: 30 g; Refill: 2    Return in about 6 months (around 07/21/2022) for PCP for chronic conditions.  The patient was given clear instructions to go to ER or return to medical center if symptoms don't improve, worsen or new  problems develop. The patient verbalized understanding. The patient was told to call to get lab results if they haven't heard anything in the next week.      Georgian Co, PA-C [facility] [place], [place] [phone]   01/20/2022, 10:33 AM

## 2022-02-02 ENCOUNTER — Other Ambulatory Visit: Payer: Self-pay | Admitting: Infectious Diseases

## 2022-02-02 DIAGNOSIS — B20 Human immunodeficiency virus [HIV] disease: Secondary | ICD-10-CM

## 2022-05-13 ENCOUNTER — Other Ambulatory Visit (HOSPITAL_COMMUNITY)
Admission: RE | Admit: 2022-05-13 | Discharge: 2022-05-13 | Disposition: A | Discharge status: Home / Self Care | Payer: Medicaid Other | Source: Ambulatory Visit | Attending: Internal Medicine | Admitting: Internal Medicine

## 2022-05-13 ENCOUNTER — Other Ambulatory Visit: Payer: Self-pay

## 2022-05-13 ENCOUNTER — Encounter: Payer: Self-pay | Admitting: Internal Medicine

## 2022-05-13 ENCOUNTER — Ambulatory Visit (INDEPENDENT_AMBULATORY_CARE_PROVIDER_SITE_OTHER): Payer: Medicaid Other | Admitting: Internal Medicine

## 2022-05-13 VITALS — BP 119/89 | HR 71 | Temp 98.0°F | Ht 72.0 in | Wt 185.0 lb

## 2022-05-13 DIAGNOSIS — B20 Human immunodeficiency virus [HIV] disease: Secondary | ICD-10-CM

## 2022-05-13 DIAGNOSIS — H3092 Unspecified chorioretinal inflammation, left eye: Secondary | ICD-10-CM | POA: Diagnosis not present

## 2022-05-13 DIAGNOSIS — Z113 Encounter for screening for infections with a predominantly sexual mode of transmission: Secondary | ICD-10-CM | POA: Insufficient documentation

## 2022-05-13 DIAGNOSIS — Z79899 Other long term (current) drug therapy: Secondary | ICD-10-CM

## 2022-05-13 DIAGNOSIS — L0292 Furuncle, unspecified: Secondary | ICD-10-CM

## 2022-05-13 MED ORDER — BICTEGRAVIR-EMTRICITAB-TENOFOV 50-200-25 MG PO TABS: 1.0000 | ORAL_TABLET | Freq: Every day | ORAL | 11 refills | Status: DC

## 2022-05-13 MED ORDER — DOXYCYCLINE HYCLATE 100 MG PO TABS: 100.0000 mg | ORAL_TABLET | Freq: Two times a day (BID) | ORAL | 5 refills | Status: DC

## 2022-05-13 NOTE — Assessment & Plan Note (Signed)
Will check his lipid panel.  Will discuss statin treatment next visit.

## 2022-05-13 NOTE — Assessment & Plan Note (Signed)
Will screen 

## 2022-05-13 NOTE — Assessment & Plan Note (Signed)
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  He though would like another opinion regarding his eyes so will refer him to a different ophthalmologist.

## 2022-05-13 NOTE — Assessment & Plan Note (Addendum)
He is doing good on his current regimen and no changes indicated.  Will confirm with labs today.  Did not want vaccines today Follow up in 3-4 months.

## 2022-05-13 NOTE — Progress Notes (Signed)
   Subjective:    Patient ID: Mason Moran, male    DOB: 1971/03/27, 52 y.o.   MRN: [id number]  HPI Mason Moran is here for follow up of HIV He continues on [place] and doing well with this.   Last CD4 up to 196.   He continues to have visual problems with left eye blindness and right eye blurriness.   He continues to have issues with eczema and skin boils.  He currently is having some cough, body aches, n/v.     Review of Systems  Constitutional:  Positive for fatigue. Negative for chills and fever.  Gastrointestinal:  Positive for nausea. Negative for diarrhea.  Skin:  Negative for rash.       Objective:   Physical Exam Pulmonary:     Effort: Pulmonary effort is normal.  Skin:    Findings: No rash.  Neurological:     Mental Status: He is alert.   SH: + tobacco        Assessment & Plan:

## 2022-05-14 LAB — URINE CYTOLOGY ANCILLARY ONLY
Chlamydia: NEGATIVE
Comment: NEGATIVE
Comment: NORMAL
Neisseria Gonorrhea: NEGATIVE

## 2022-05-14 LAB — T-HELPER CELLS (CD4) COUNT (NOT AT ARMC)
CD4 % Helper T Cell: 18 % — ABNORMAL LOW (ref 33–65)
CD4 T Cell Abs: 277 /uL — ABNORMAL LOW (ref 400–1790)

## 2022-05-17 LAB — CBC WITH DIFFERENTIAL/PLATELET
Absolute Monocytes: 329 cells/uL (ref 200–950)
Basophils Absolute: 32 cells/uL (ref 0–200)
Basophils Relative: 0.7 %
Eosinophils Absolute: 248 cells/uL (ref 15–500)
Eosinophils Relative: 5.5 %
HCT: 46.6 % (ref 38.5–50.0)
Hemoglobin: 15.3 g/dL (ref 13.2–17.1)
Lymphs Abs: 1737 cells/uL (ref 850–3900)
MCH: 29.3 pg (ref 27.0–33.0)
MCHC: 32.8 g/dL (ref 32.0–36.0)
MCV: 89.1 fL (ref 80.0–100.0)
MPV: 10.8 fL (ref 7.5–12.5)
Monocytes Relative: 7.3 %
Neutro Abs: 2156 cells/uL (ref 1500–7800)
Neutrophils Relative %: 47.9 %
Platelets: 331 10*3/uL (ref 140–400)
RBC: 5.23 10*6/uL (ref 4.20–5.80)
RDW: 11.6 % (ref 11.0–15.0)
Total Lymphocyte: 38.6 %
WBC: 4.5 10*3/uL (ref 3.8–10.8)

## 2022-05-17 LAB — LIPID PANEL
Cholesterol: 99 mg/dL (ref <200)
HDL: 34 mg/dL — ABNORMAL LOW (ref >=40)
LDL Cholesterol (Calc): 49 mg/dL (calc)
Non-HDL Cholesterol (Calc): 65 mg/dL (calc) (ref <130)
Total CHOL/HDL Ratio: 2.9 (calc) (ref <5.0)
Triglycerides: 78 mg/dL (ref <150)

## 2022-05-17 LAB — COMPLETE METABOLIC PANEL WITH GFR
AG Ratio: 1.2 (calc) (ref 1.0–2.5)
ALT: 14 U/L (ref 9–46)
AST: 14 U/L (ref 10–35)
Albumin: 4.3 g/dL (ref 3.6–5.1)
Alkaline phosphatase (APISO): 69 U/L (ref 35–144)
BUN: 8 mg/dL (ref 7–25)
CO2: 29 mmol/L (ref 20–32)
Calcium: 9.5 mg/dL (ref 8.6–10.3)
Chloride: 106 mmol/L (ref 98–110)
Creat: 0.96 mg/dL (ref 0.70–1.30)
Globulin: 3.7 g/dL (calc) (ref 1.9–3.7)
Glucose, Bld: 68 mg/dL (ref 65–99)
Potassium: 4.3 mmol/L (ref 3.5–5.3)
Sodium: 142 mmol/L (ref 135–146)
Total Bilirubin: 0.6 mg/dL (ref 0.2–1.2)
Total Protein: 8 g/dL (ref 6.1–8.1)
eGFR: 95 mL/min/{1.73_m2} (ref >=60)

## 2022-05-17 LAB — HIV-1 RNA QUANT-NO REFLEX-BLD
HIV 1 RNA Quant: 60 Copies/mL — ABNORMAL HIGH
HIV-1 RNA Quant, Log: 1.77 Log cps/mL — ABNORMAL HIGH

## 2022-05-17 LAB — RPR: RPR Ser Ql: NONREACTIVE

## 2022-06-02 ENCOUNTER — Ambulatory Visit: Payer: Self-pay | Admitting: *Deleted

## 2022-06-02 NOTE — Telephone Encounter (Signed)
Pt saw Levada Dy back in September for the same reason.

## 2022-06-02 NOTE — Telephone Encounter (Signed)
Summary: boils   Patient experiencing boils underneath armpits and on buttocks. Requesting PCP to prescribe amoxicillin-clavulanate (AUGMENTIN) 875-125 MG tablet again. No available appointments          Chief Complaint: reoccurring boils- under arm and buttock Symptoms: pain, swelling bumps Frequency: last treated 01/20/22- same area reoccurring  Pertinent Negatives: Patient denies fever Disposition: [] ED /[] Urgent Care (no appt availability in office) / [] Appointment(In office/virtual)/ []  [facility] [place] [facility]/ [] Home Care/ [x] Refused Recommended Disposition /[] [place]/ []  Follow-up with PCP Additional Notes: Patient states he can not afford office co pay- he states he has reoccurring boils and is requesting Rx be sent to [facility]- patient is blind and has medication delivered.   Reason for Disposition  [1] Boil AND [2] diabetes mellitus or weak immune system (e.g., HIV positive, cancer chemo, splenectomy, organ transplant, chronic steroids)  Answer Assessment - Initial Assessment Questions 1. APPEARANCE of BOIL: "What does the boil look like?"      Red irritated 2. LOCATION: "Where is the boil located?"      Under armpits- left  and buttock- right 3. NUMBER: "How many boils are there?"      several 4. SIZE: "How big is the boil?" (e.g., inches, cm; compare to size of a coin or other object)     Dime size or smaller 5. ONSET: "When did the boil start?"     Last week 6. PAIN: "Is there any pain?" If Yes, ask: "How bad is the pain?"   (Scale 1-10; or mild, moderate, severe)     Yes- very sore 7. FEVER: "Do you have a fever?" If Yes, ask: "What is it, how was it measured, and when did it start?"      no 8. SOURCE: "Have you been around anyone with boils or other Staph infections?" "Have you ever had boils before?"     Reoccurring skin infection 9. OTHER SYMPTOMS: "Do you have any other symptoms?" (e.g., shaking chills, weakness, rash elsewhere on body)      no  Protocols used: Boil (Skin Abscess)-A-AH

## 2022-06-03 ENCOUNTER — Other Ambulatory Visit: Payer: Self-pay

## 2022-06-03 DIAGNOSIS — L0292 Furuncle, unspecified: Secondary | ICD-10-CM

## 2022-06-03 MED ORDER — DOXYCYCLINE HYCLATE 100 MG PO TABS: 100.0000 mg | ORAL_TABLET | Freq: Two times a day (BID) | ORAL | 5 refills | Status: DC

## 2022-06-03 NOTE — Telephone Encounter (Signed)
Infectious disease specialist Dr. Linus Salmons just prescribed doxycycline on 05/13/2022.  I would like him to reach out to his infectious disease specialist for this to keep him in the loop as he would need to be aware due to his underlying medical history.

## 2022-06-03 NOTE — Telephone Encounter (Signed)
Pt has been informed of PCP response  

## 2022-07-22 ENCOUNTER — Encounter: Payer: Self-pay | Admitting: Family Medicine

## 2022-07-22 ENCOUNTER — Ambulatory Visit: Payer: Medicaid Other | Attending: Family Medicine | Admitting: Family Medicine

## 2022-07-22 VITALS — BP 128/83 | HR 80 | Ht 72.0 in | Wt 185.8 lb

## 2022-07-22 DIAGNOSIS — M5416 Radiculopathy, lumbar region: Secondary | ICD-10-CM | POA: Diagnosis not present

## 2022-07-22 DIAGNOSIS — B20 Human immunodeficiency virus [HIV] disease: Secondary | ICD-10-CM | POA: Diagnosis not present

## 2022-07-22 DIAGNOSIS — R29898 Other symptoms and signs involving the musculoskeletal system: Secondary | ICD-10-CM | POA: Diagnosis not present

## 2022-07-22 DIAGNOSIS — G629 Polyneuropathy, unspecified: Secondary | ICD-10-CM | POA: Diagnosis not present

## 2022-07-22 MED ORDER — MISC. DEVICES MISC: 0 refills | Status: AC

## 2022-07-22 MED ORDER — GABAPENTIN 300 MG PO CAPS: ORAL_CAPSULE | ORAL | 1 refills | Status: DC

## 2022-07-22 NOTE — Progress Notes (Signed)
Subjective:  Patient ID: Mason Moran, male    DOB: 19-Nov-1970  Age: 52 y.o. MRN: [id number]  CC: Back Pain   HPI Mason Moran is a 52 y.o. 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  Interval History:  He has has been falling due to lower back pain and left leg giving out on him and he is requesting a cane. He Complains of neuropathy in the left foot, and sometimes has jerking of his left foot.  Placed on gabapentin at his last office visit but that has been ineffective.  He does feel weak in his left leg. For his HIV he is under the care of infectious disease. Past Medical History:  Diagnosis Date   HIV (human immunodeficiency virus infection) ([place])     No past surgical history on file.  No family history on file.  Social History   Socioeconomic History   Marital status: Single    Spouse name: Not on file   Number of children: Not on file   Years of education: Not on file   Highest education level: Not on file  Occupational History   Not on file  Tobacco Use   Smoking status: Every Day    Packs/day: .5    Types: Cigarettes   Smokeless tobacco: Never  Vaping Use   Vaping Use: Never used  Substance and Sexual Activity   Alcohol use: Not Currently    Comment: none in a month   Drug use: Yes    Types: Marijuana    Comment: daily   Sexual activity: Not on file    Comment: declined condoms  Other Topics Concern   Not on file  Social History Narrative   Not on file   Social Determinants of Health   Financial Resource Strain: Not on file  Food Insecurity: Not on file  Transportation Needs: Not on file  Physical Activity: Not on file  Stress: Not on file  Social Connections: Not on file    Allergies  Allergen Reactions   Dapsone Other (See Comments)    Other reaction(s): Other (See Comments) G6PD deficiency    Other Other (See Comments)    Seafood - can't speak   Primaquine     Other reaction(s): Other (See Comments) G6PD deficiency    Outpatient Medications Prior to Visit  Medication Sig Dispense Refill   bictegravir-emtricitabine-tenofovir AF (BIKTARVY) 50-200-25 MG TABS tablet Take 1 tablet by mouth daily. 30 tablet 11   doxycycline (VIBRA-TABS) 100 MG tablet Take 1 tablet (100 mg total) by mouth 2 (two) times daily. 60 tablet 5   mupirocin ointment (BACTROBAN) 2 % Apply 1 Application topically 2 (two) times daily. X 1 week Prn boils 30 g 2   valACYclovir (VALTREX) 500 MG tablet TAKE 1 TABLET BY MOUTH TWICE DAILY 60 tablet 1   gabapentin (NEURONTIN) 300 MG capsule Take 1 in the morning and 2 at bedtime 90 capsule 3   No facility-administered medications prior to visit.     ROS Review of Systems General: negative for fever, weight loss, appetite change Eyes: no visual symptoms. ENT: no ear symptoms, no sinus tenderness, no nasal congestion or sore throat. Neck: no pain  Respiratory: no wheezing, shortness of breath, cough Cardiovascular: no chest pain, no dyspnea on exertion, no pedal edema, no orthopnea. Gastrointestinal: no abdominal pain, no diarrhea, no constipation Genito-Urinary: no urinary  frequency, no dysuria, no polyuria. Hematologic: no bruising Endocrine: no cold or heat intolerance Neurological: no headaches, no seizures, no tremors, + numbness Musculoskeletal:see hpi Skin: no pruritus, no rash. Psychological: no depression, no anxiety,   Objective:  BP 128/83   Pulse 80   Ht 6' (1.829 m)   Wt 185 lb 12.8 oz (84.3 kg)   SpO2 99%   BMI 25.20 kg/m      07/22/2022    8:59 AM 05/13/2022    8:30 AM 05/13/2022    8:22 AM  BP/Weight  Systolic BP 0000000 [id number]   Diastolic BP 83 89   Wt. (Lbs) 185.8  185  BMI 25.2 kg/m2  25.09 kg/m2      Physical Exam Constitutional:      Appearance: He is well-developed.  Cardiovascular:     Rate and Rhythm: Normal rate.      Heart sounds: Normal heart sounds. No murmur heard. Pulmonary:     Effort: Pulmonary effort is normal.     Breath sounds: Normal breath sounds. No wheezing or rales.  Chest:     Chest wall: No tenderness.  Abdominal:     General: Bowel sounds are normal. There is no distension.     Palpations: Abdomen is soft. There is no mass.     Tenderness: There is no abdominal tenderness.  Musculoskeletal:        General: Tenderness (left lumbar region) present.     Right lower leg: No edema.     Left lower leg: No edema.  Neurological:     Mental Status: He is alert and oriented to person, place, and time.     Coordination: Coordination normal.     Gait: Gait abnormal.     Deep Tendon Reflexes: Reflexes normal.     Comments: Motor exam: Strength- Left lower extremity-3/5 Right lower extremity-5/5  Psychiatric:        Mood and Affect: Mood normal.        Latest Ref Rng & Units 05/13/2022    9:22 AM 07/02/2021    4:03 PM 06/16/2021    1:20 AM  CMP  Glucose 65 - 99 mg/dL 68  86  102   BUN 7 - 25 mg/dL '8  11  5   '$ Creatinine 0.70 - 1.30 mg/dL 0.96  0.86  0.62   Sodium 135 - 146 mmol/L 142  139  134   Potassium 3.5 - 5.3 mmol/L 4.3  4.3  4.0   Chloride 98 - 110 mmol/L 106  103  103   CO2 20 - 32 mmol/L '29  29  25   '$ Calcium 8.6 - 10.3 mg/dL 9.5  9.4  7.3   Total Protein 6.1 - 8.1 g/dL 8.0  8.0    Total Bilirubin 0.2 - 1.2 mg/dL 0.6  0.2    AST 10 - 35 U/L 14  16    ALT 9 - 46 U/L 14  20      Lipid Panel     Component Value Date/Time   CHOL 99 05/13/2022 0922   TRIG 78 05/13/2022 0922   HDL 34 (L) 05/13/2022 0922   CHOLHDL 2.9 05/13/2022 0922   LDLCALC 49 05/13/2022 0922    CBC    Component Value Date/Time   WBC 4.5 05/13/2022 0922   RBC 5.23 05/13/2022 0922   HGB 15.3 05/13/2022 0922   HGB 12.3 (L) 07/21/2021 1531   HCT 46.6 05/13/2022 0922   HCT 36.9 (L) 07/21/2021 1531   PLT  331 05/13/2022 0922   PLT 277 07/21/2021 1531   MCV 89.1 05/13/2022 0922   MCV 90 07/21/2021  1531   MCH 29.3 05/13/2022 0922   MCHC 32.8 05/13/2022 0922   RDW 11.6 05/13/2022 0922   RDW 17.7 (H) 07/21/2021 1531   LYMPHSABS 1,737 05/13/2022 0922   LYMPHSABS 1.7 07/21/2021 1531   MONOABS 0.6 06/12/2021 1130   EOSABS 248 05/13/2022 0922   EOSABS 1.1 (H) 07/21/2021 1531   BASOSABS 32 05/13/2022 0922   BASOSABS 0.0 07/21/2021 1531    Lab Results  Component Value Date   HGBA1C 4.8 07/21/2021    Assessment & Plan:  1. Neuropathy Uncontrolled on gabapentin He will need nerve conduction studies - Ambulatory referral to Neurology - Ambulatory referral to Physical Medicine Rehab - gabapentin (NEURONTIN) 300 MG capsule; Take 1 in the morning and 2 at bedtime  Dispense: 90 capsule; Refill: 1  2. Left leg weakness He does have some form of lumbar radiculopathy - Misc. Devices MISC; Cane. Diagnosis- lower back pain  Dispense: 1 each; Refill: 0 - Ambulatory referral to Neurology - Ambulatory referral to Physical Medicine Rehab  3. Lumbar radiculopathy - DG Lumbar Spine Complete; Future - Ambulatory referral to Physical Medicine Rehab  4. Symptomatic HIV infection ([place]) Continue with antiretroviral therapy    Meds ordered this encounter  Medications   Misc. Devices MISC    Sig: Cane. Diagnosis- lower back pain    Dispense:  1 each    Refill:  0   DISCONTD: gabapentin (NEURONTIN) 300 MG capsule    Sig: Take 1 in the morning and 2 at bedtime    Dispense:  90 capsule    Refill:  1   gabapentin (NEURONTIN) 300 MG capsule    Sig: Take 1 in the morning and 2 at bedtime    Dispense:  90 capsule    Refill:  1     Follow-up: Return in about 3 months (around 10/22/2022) for Chronic medical conditions.       Charlott Rakes, MD, FAAFP. [facility] [place], [place]   07/22/2022, 2:53 PM

## 2022-07-22 NOTE — Progress Notes (Signed)
Wants to get a cane Off balance Pain in lower back and down left leg.

## 2022-07-22 NOTE — Patient Instructions (Signed)

## 2022-08-02 ENCOUNTER — Ambulatory Visit
Admission: RE | Admit: 2022-08-02 | Discharge: 2022-08-02 | Disposition: A | Discharge status: Home / Self Care | Payer: Medicaid Other | Source: Ambulatory Visit | Attending: Family Medicine | Admitting: Family Medicine

## 2022-08-02 DIAGNOSIS — M5416 Radiculopathy, lumbar region: Secondary | ICD-10-CM

## 2022-08-04 ENCOUNTER — Encounter: Payer: Self-pay | Admitting: Physical Medicine and Rehabilitation

## 2022-08-10 NOTE — Progress Notes (Signed)
Left VM to call back for lab results.

## 2022-08-30 ENCOUNTER — Encounter
Payer: Medicare Other | Attending: Physical Medicine and Rehabilitation | Admitting: Physical Medicine and Rehabilitation

## 2022-08-30 VITALS — BP 144/89 | HR 65 | Ht 72.0 in | Wt 185.0 lb

## 2022-08-30 DIAGNOSIS — G63 Polyneuropathy in diseases classified elsewhere: Secondary | ICD-10-CM | POA: Diagnosis not present

## 2022-08-30 DIAGNOSIS — B20 Human immunodeficiency virus [HIV] disease: Secondary | ICD-10-CM | POA: Diagnosis not present

## 2022-08-30 DIAGNOSIS — M5442 Lumbago with sciatica, left side: Secondary | ICD-10-CM | POA: Diagnosis not present

## 2022-08-30 DIAGNOSIS — R2681 Unsteadiness on feet: Secondary | ICD-10-CM | POA: Insufficient documentation

## 2022-08-30 MED ORDER — DULOXETINE HCL 30 MG PO CPEP: 30.0000 mg | ORAL_CAPSULE | Freq: Every day | ORAL | 3 refills | Status: DC

## 2022-08-30 NOTE — Progress Notes (Addendum)
Subjective:    Patient ID: Mason Moran, male    DOB: [birth year], 52 y.o.   MRN: [id number]  HPI HPI  Mason Moran is a 52 y.o. year old male  who  has a past medical history of HIV (human immunodeficiency virus infection) (HCC).   They are presenting to PM&R clinic as a new patient for pain management evaluation. They were referred by by Dr. Hoy Register for treatment of LLE neuropathic pain.   Source: LLE Inciting incident: ever since hospitalization in January for pneumonia. May have been going on before then, but he "never pain attention to it."  Red flag symptoms: new weakness, new numbness/tingling, and pain waking up at nighttime. + Urinary incontinence/urgency.   " Always fall backwards on the same spot"; last fell 2 weeks ago.  Medications tried: Topical medications (never tried) :  Nsaids (mild effect) : Aleve - going through 2-3 tablets at one time; causes tiredness (? Taking Aleve PM - patient unsure). Was taking Excedrin.  Tylenol  (no effect) : Tylenol also makes him sleepy Opiates  (no effect) : Tramadol was ineffective in the past.   Gabapentin / Lyrica  (mild effect) : Takes 1 at nighttime and 1 in the morning; is supposed to take 2 at night but they "just make me sleep".  TCAs  (never tried) :  SNRIs  (never tried) :  Other  (never tried) :   Other treatments: PT/OT  (never tried) :  Accupuncture/chiropractor/massage  (never tried) :  TENs unit (never tried) :  Injections (never tried) :  Surgery (never tried) :  Other  (never tried) :   Goals for pain control: Wants to lay on the ground and play with his niece. Cannot lay on his left side. Used to be able to bike 6-7 miles a day.   Prior UDS results: No results found for: "LABOPIA", "COCAINSCRNUR", "LABBENZ", "AMPHETMU", "THCU", "LABBARB"  . Does endorse cannabis use.   Smokes 1 pack/3 days  Does not drink alcohol.   Pain Inventory Average Pain 7 Pain Right Now 10 My pain is sharp  In the last  24 hours, has pain interfered with the following? General activity 10 Relation with others 7 Enjoyment of life 5 What TIME of day is your pain at its worst? morning  and night Sleep (in general) Fair  Pain is worse with: walking, bending, sitting, and standing Pain improves with: medication Relief from Meds:  n/a  use a cane ability to climb steps?  yes do you drive?  no  not employed: date last employed 2019  numbness tingling trouble walking  x-rays  New patient    No family history on file. Social History   Socioeconomic History   Marital status: Single    Spouse name: Not on file   Number of children: Not on file   Years of education: Not on file   Highest education level: Not on file  Occupational History   Not on file  Tobacco Use   Smoking status: Every Day    Packs/day: .5    Types: Cigarettes   Smokeless tobacco: Never  Vaping Use   Vaping Use: Never used  Substance and Sexual Activity   Alcohol use: Not Currently    Comment: none in a month   Drug use: Yes    Types: Marijuana    Comment: daily   Sexual activity: Not on file    Comment: declined condoms  Other Topics Concern   Not on  file  Social History Narrative   Not on file   Social Determinants of Health   Financial Resource Strain: Not on file  Food Insecurity: Not on file  Transportation Needs: Not on file  Physical Activity: Not on file  Stress: Not on file  Social Connections: Not on file   No past surgical history on file. Past Medical History:  Diagnosis Date   HIV (human immunodeficiency virus infection) (HCC)    BP (!) 114/95   Pulse 65   Ht 6' (1.829 m)   Wt 174 lb (78.9 kg)   SpO2 98%   BMI 23.60 kg/m   Opioid Risk Score:   Fall Risk Score:  `1  Depression screen PHQ 2/9     08/30/2022    1:40 PM 05/13/2022    8:29 AM 01/20/2022   10:28 AM 01/12/2022   10:40 AM 07/21/2021    2:47 PM  Depression screen PHQ 2/9  Decreased Interest 3 0 2 0 0  Down, Depressed,  Hopeless 1 0 0 0 0  PHQ - 2 Score 4 0 2 0 0  Altered sleeping 1  2  0  Tired, decreased energy 1  3  3   Change in appetite 1  0  0  Feeling bad or failure about yourself  0  0  0  Trouble concentrating 3  0  1  Moving slowly or fidgety/restless 3  0  0  Suicidal thoughts 0  0  0  PHQ-9 Score 13  7  4      Review of Systems  Musculoskeletal:  Positive for back pain.       LT leg/foot pain  All other systems reviewed and are negative.     Objective:   Physical Exam   PE: Constitution: Appropriate appearance for age. No apparent distress  Resp: No respiratory distress. No accessory muscle usage. On RA, bilateral mildly rhonchorous lung sounds. No distress.  Cardio: Well perfused appearance.  No peripheral edema. Abdomen: Nondistended. Nontender.   Psych: Appropriate mood and affect. Neuro: AAOx4. No apparent cognitive deficits   Neurologic Exam:   DTRs: Reflexes were 2+ in bilateral achilles, patella, biceps, BR and triceps. Babinsky: flexor responses b/l.   Hoffmans: negative b/l Sensory exam: revealed normal sensation in all dermatomal regions in bilateral upper extremities, right lower extremity, and with reduced sensation to light touch in L lateral and medial malleolus, and 1st toe Motor exam: strength 5/5 throughout bilateral upper extremities, right lower extremity, and with exception of LLE 4/5 HF, KE, KF, DF; 3/5 plantarflexion; 3/5 bilateral EHL Coordination: Fine motor coordination was normal.   Gait: +antalgic gait; single point cane in L hand; stacks over L leg . Nearly falls when turning due to L knee giving out.   Back exam:  + TTP L PSIS; otherwise no TTP. R paraspinals hypertrophied.  - facet loading + Slump      Assessment & Plan:   Mason Moran is a 52 y.o. year old male  who  has a past medical history of HIV (human immunodeficiency virus infection) (HCC).   They are presenting to PM&R clinic as a new patient for treatment of LLE pain . They were  referred by their PCP . Based on their presentation, LLE neuropathy ? Sciatica vs. L4-S1 radiculopathy vs. Polyneuropathy related to B12 deficiency, possibly HIV.   Polyneuropathy associated with underlying disease Follow up for EMG bilateral LE with myself or Dr. Wynn Banker  **update 6/19: Result of EMG with Dr. Wynn Banker  show bilateral axonal sensorimotor polyneuropathy, slightly worse on L than R, with single PSW in L gastroc but no other findings to suggest radiculopathy**  Follow up with me in 3 months; messgae through [facility] or call office in interim if needed Will avoid narcotic pain medications for now   Left-sided low back pain with left-sided sciatica, unspecified chronicity PT script sent for low back pain, gait stability Will order MRI low back pending results from EMG to plan for ESI if indicated Start Duloxetine 30 mg daily; after 2 weeks, can increase to twice daily if desired  ** Based on EMG results above, would not pursue MRI/ESI; will discuss at follow up** Gait instability PT referral Continue use of cane, recommend quad cane for stability instead of single point, DME order sent  Human immunodeficiency virus (HIV) disease States he is compliant with medications

## 2022-08-30 NOTE — Patient Instructions (Signed)
Polyneuropathy associated with underlying disease Follow up for EMG bilateral LE with myself or Dr. Wynn Banker  Follow up with me in 3 months; messgae through [facility] or call office in interim if needed  Will avoid narcotic pain medications for now   Left-sided low back pain with left-sided sciatica, unspecified chronicity PT script sent for low back pain, gait stability  Will order MRI low back pending results from EMG to plan for ESI if indicated  Start Duloxetine 30 mg daily; after 2 weeks, can increase to twice daily if desired  Gait instability Continue use of cane, recommend quad cane for stability instead of single point, DME order sent

## 2022-09-14 ENCOUNTER — Ambulatory Visit: Payer: Medicaid Other | Admitting: Internal Medicine

## 2022-09-22 ENCOUNTER — Encounter: Payer: Self-pay | Admitting: Diagnostic Neuroimaging

## 2022-09-22 ENCOUNTER — Ambulatory Visit: Payer: Medicaid Other | Admitting: Diagnostic Neuroimaging

## 2022-09-30 ENCOUNTER — Encounter: Payer: Medicare Other | Admitting: Physical Medicine & Rehabilitation

## 2022-10-08 ENCOUNTER — Other Ambulatory Visit: Payer: Self-pay

## 2022-10-08 ENCOUNTER — Telehealth: Payer: Self-pay | Admitting: Pharmacist

## 2022-10-08 ENCOUNTER — Ambulatory Visit (INDEPENDENT_AMBULATORY_CARE_PROVIDER_SITE_OTHER): Payer: Medicare Other | Admitting: Internal Medicine

## 2022-10-08 ENCOUNTER — Encounter: Payer: Self-pay | Admitting: Internal Medicine

## 2022-10-08 VITALS — BP 135/89 | HR 75 | Temp 97.3°F | Ht 72.0 in | Wt 188.0 lb

## 2022-10-08 DIAGNOSIS — H3092 Unspecified chorioretinal inflammation, left eye: Secondary | ICD-10-CM

## 2022-10-08 DIAGNOSIS — K089 Disorder of teeth and supporting structures, unspecified: Secondary | ICD-10-CM

## 2022-10-08 DIAGNOSIS — H547 Unspecified visual loss: Secondary | ICD-10-CM

## 2022-10-08 DIAGNOSIS — B49 Unspecified mycosis: Secondary | ICD-10-CM | POA: Diagnosis not present

## 2022-10-08 DIAGNOSIS — B20 Human immunodeficiency virus [HIV] disease: Secondary | ICD-10-CM

## 2022-10-08 MED ORDER — CLOTRIMAZOLE 1 % EX CREA: 1.0000 | TOPICAL_CREAM | Freq: Two times a day (BID) | CUTANEOUS | 3 refills | Status: DC

## 2022-10-08 MED ORDER — BICTEGRAVIR-EMTRICITAB-TENOFOV 50-200-25 MG PO TABS: 1.0000 | ORAL_TABLET | Freq: Every day | ORAL | 11 refills | Status: DC

## 2022-10-08 NOTE — Telephone Encounter (Signed)
I met with the patient to discuss initiating Cabenuva for his HIV management. I started counseling on the rare occurrence of resistance despite being compliant with the medication. The patient decided he would like to stick with orals at this point and not proceed further with the injections. He is aware to call if he has any questions or would like to discuss Cabenuva more.  Thanks,  Arabella Merles, PharmD. [facility] PGY-1 10/08/2022 9:53 AM

## 2022-10-08 NOTE — Assessment & Plan Note (Signed)
Dental referral placed today for [facility]. Information to schedule appointment completed today.   

## 2022-10-08 NOTE — Progress Notes (Signed)
   Subjective:    Patient ID: Mason Moran, male    DOB: 24-Feb-1971, 52 y.o.   MRN: [id number]  HPI Mason Moran is here for follow up of HIV He has been on [place] and doing well.  CD 4 up to 277 last visit.  Was in jail a short time and did get his medication.  No issues otherwise with getting or taking his medication.  Having issues with dry hands and feet.  Still with issues of rash in his axillary area.     Review of Systems  Constitutional:  Negative for fatigue.  Gastrointestinal:  Negative for diarrhea and nausea.  Skin:  Negative for rash.       Objective:   Physical Exam Eyes:     General: No scleral icterus. Pulmonary:     Effort: Pulmonary effort is normal.  Skin:    Comments: Both hands with dry, cracking skin  Neurological:     Mental Status: He is alert.  Psychiatric:        Mood and Affect: Mood normal.   SH: + tobacco        Assessment & Plan:

## 2022-10-08 NOTE — Assessment & Plan Note (Addendum)
He is doing well on biktarvy and was interested in [place] but will defer for now.   Refills provided.   Will check an archive genosure as well to verify any baseline resistance or not Follow up in 6 months

## 2022-10-08 NOTE — Assessment & Plan Note (Signed)
He has significant visual issues and has not been back to ophthalmology.  I will refer back to Dr. Dione Booze.

## 2022-10-08 NOTE — Assessment & Plan Note (Signed)
Hands and feet c/w fungal infection.  Will prescribe antifungal cream

## 2022-10-11 LAB — T-HELPER CELLS (CD4) COUNT (NOT AT ARMC)
Absolute CD4: 317 cells/uL — ABNORMAL LOW (ref 490–1740)
CD4 T Helper %: 17 % — ABNORMAL LOW (ref 30–61)
Total lymphocyte count: 1838 cells/uL (ref 850–3900)

## 2022-10-11 LAB — HIV-1 RNA QUANT-NO REFLEX-BLD
HIV 1 RNA Quant: NOT DETECTED Copies/mL
HIV-1 RNA Quant, Log: NOT DETECTED Log cps/mL

## 2022-10-26 ENCOUNTER — Encounter: Payer: Medicare Other | Attending: Physical Medicine and Rehabilitation | Admitting: Physical Medicine & Rehabilitation

## 2022-10-26 ENCOUNTER — Ambulatory Visit: Payer: Medicaid Other | Admitting: Family Medicine

## 2022-10-26 ENCOUNTER — Encounter: Payer: Self-pay | Admitting: Physical Medicine & Rehabilitation

## 2022-10-26 VITALS — BP 131/83 | HR 53 | Temp 97.7°F | Wt 191.0 lb

## 2022-10-26 DIAGNOSIS — R200 Anesthesia of skin: Secondary | ICD-10-CM | POA: Insufficient documentation

## 2022-10-26 DIAGNOSIS — R202 Paresthesia of skin: Secondary | ICD-10-CM | POA: Insufficient documentation

## 2022-10-26 NOTE — Progress Notes (Signed)
Patient referred by Dr. Shearon Stalls for EMG/NCV bilateral lower extremities.  Please review report under media tab.  Patient will follow-up with Dr. Shearon Stalls to discuss results at appointment next month.

## 2022-11-02 ENCOUNTER — Encounter: Payer: Self-pay | Admitting: Physical Medicine & Rehabilitation

## 2022-11-02 ENCOUNTER — Ambulatory Visit: Payer: Medicaid Other | Admitting: Family Medicine

## 2022-11-16 ENCOUNTER — Encounter: Payer: Self-pay | Admitting: Family Medicine

## 2022-11-16 ENCOUNTER — Ambulatory Visit: Payer: Medicare Other | Attending: Family Medicine | Admitting: Family Medicine

## 2022-11-16 VITALS — BP 105/65 | HR 71 | Temp 99.0°F | Wt 185.4 lb

## 2022-11-16 DIAGNOSIS — M5442 Lumbago with sciatica, left side: Secondary | ICD-10-CM | POA: Insufficient documentation

## 2022-11-16 DIAGNOSIS — Z21 Asymptomatic human immunodeficiency virus [HIV] infection status: Secondary | ICD-10-CM | POA: Diagnosis not present

## 2022-11-16 DIAGNOSIS — B20 Human immunodeficiency virus [HIV] disease: Secondary | ICD-10-CM | POA: Diagnosis not present

## 2022-11-16 DIAGNOSIS — Z8614 Personal history of Methicillin resistant Staphylococcus aureus infection: Secondary | ICD-10-CM | POA: Insufficient documentation

## 2022-11-16 DIAGNOSIS — G629 Polyneuropathy, unspecified: Secondary | ICD-10-CM | POA: Insufficient documentation

## 2022-11-16 DIAGNOSIS — F17210 Nicotine dependence, cigarettes, uncomplicated: Secondary | ICD-10-CM | POA: Insufficient documentation

## 2022-11-16 DIAGNOSIS — L0292 Furuncle, unspecified: Secondary | ICD-10-CM | POA: Diagnosis not present

## 2022-11-16 DIAGNOSIS — L02421 Furuncle of right axilla: Secondary | ICD-10-CM | POA: Diagnosis present

## 2022-11-16 DIAGNOSIS — M5416 Radiculopathy, lumbar region: Secondary | ICD-10-CM

## 2022-11-16 MED ORDER — DULOXETINE HCL 30 MG PO CPEP: 30.0000 mg | ORAL_CAPSULE | Freq: Every day | ORAL | 3 refills | Status: DC

## 2022-11-16 MED ORDER — GABAPENTIN 300 MG PO CAPS: ORAL_CAPSULE | ORAL | 1 refills | Status: DC

## 2022-11-16 MED ORDER — DOXYCYCLINE HYCLATE 100 MG PO TABS: 100.0000 mg | ORAL_TABLET | Freq: Two times a day (BID) | ORAL | 0 refills | Status: DC

## 2022-11-16 NOTE — Patient Instructions (Signed)
Skin Abscess  A skin abscess is an infected area on or under your skin. It contains pus and other material. An abscess may also be called a furuncle, carbuncle, or boil. It is often the result of an infection caused by bacteria. An abscess can occur in or on almost any part of your body. Sometimes, an abscess may break open (rupture) on its own. In most cases, it will keep getting worse unless it is treated. An abscess can cause pain and make you feel ill. An untreated abscess can cause infection to spread to other parts of your body or your bloodstream. The abscess may need to be drained. You may also need to take antibiotics. What are the causes? An abscess occurs when germs, like bacteria, pass through your skin and cause an infection. This may be caused by: A scrape or cut on your skin. A puncture wound through your skin, such as a needle injection or insect bite. Blocked oil or sweat glands. Blocked and infected hair follicles. A fluid-filled sac that forms beneath your skin (sebaceous cyst) and becomes infected. What increases the risk? You may be more likely to develop an abscess if: You have problems with blood circulation, or you have a weak body defense system (immune system). You have diabetes. You have dry and irritated skin. You get injections often or use IV drugs. You have a foreign body in a wound, such as a splinter. You smoke or use tobacco products. What are the signs or symptoms? Symptoms of this condition include: A painful, firm bump under the skin. A bump with pus at the top. This may break through the skin and drain. Other symptoms include: Redness and swelling around the abscess. Warmth or tenderness. Swelling of the lymph nodes (glands) near the abscess. A sore on the skin. How is this diagnosed? This condition may be diagnosed based on a physical exam and your medical history. You may also have tests done, such as: A test of a sample of pus. This may be done  to find what is causing the infection. Blood tests. Imaging tests, such as an ultrasound, CT scan, or MRI. How is this treated? A small abscess that drains on its own may not need to be treated. Treatment for larger abscesses may include: Moist heat or a heat pack applied to the area a few times a day. Incision and drainage. This is a procedure to drain the abscess. Antibiotics. For a severe abscess, you may first get antibiotics through an IV and then change to antibiotics by mouth. Follow these instructions at home: Medicines Take over-the-counter and prescription medicines only as told by your provider. If you were prescribed antibiotics, take them as told by your provider. Do not stop using the antibiotic even if you start to feel better. Abscess care  If you have an abscess that has not drained, apply heat to the affected area. Use the heat source that your provider recommends, such as a moist heat pack or a heating pad. Place a towel between your skin and the heat source. Leave the heat on for 20-30 minutes at a time. If your skin turns bright red, remove the heat right away to prevent burns. The risk of burns is higher if you cannot feel pain, heat, or cold. Follow instructions from your provider about how to take care of your abscess. Make sure you: Cover the abscess with a bandage (dressing). Wash your hands with soap and water for at least 20 seconds before   and after you change the dressing or gauze. If soap and water are not available, use hand sanitizer. Change your dressing or gauze as told by your provider. Check your abscess every day for signs of an infection that is getting worse. Check for: More redness, swelling, pain, or tenderness. More fluid or blood. Warmth. More pus or a worse smell. General instructions To avoid spreading the infection: Do not share personal care items, towels, or hot tubs with others. Avoid making skin contact with other people. Be careful  when getting rid of used dressings, wound packing, or any drainage from the abscess. Do not use any products that contain nicotine or tobacco. These products include cigarettes, chewing tobacco, and vaping devices, such as e-cigarettes. If you need help quitting, ask your provider. Do not use any creams, ointments, or liquids unless you have been told to by your provider. Contact a health care provider if: You see redness that spreads quickly or red streaks on your skin spreading away from the abscess. You have any signs of worse infection at the abscess. You vomit every time you eat or drink. You have a fever, chills, or muscle aches. The cyst or abscess returns. Get help right away if: You have severe pain. You make less pee (urine) than normal. This information is not intended to replace advice given to you by your health care provider. Make sure you discuss any questions you have with your health care provider. Document Revised: 12/09/2021 Document Reviewed: 12/09/2021 Elsevier Patient Education  2024 [employer] Inc.  

## 2022-11-16 NOTE — Progress Notes (Signed)
Patient has c/o of having boils under arms and on his stomach

## 2022-11-16 NOTE — Progress Notes (Signed)
Subjective:  Patient ID: Mason Moran, male    DOB: 1970/11/18  Age: 52 y.o. MRN: [id number]  CC: Peripheral Neuropathy   HPI Mason Moran is a 52 y.o. 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   Interval History: Discussed the use of AI scribe software for clinical note transcription with the patient, who gave verbal consent to proceed.  He presents with recurrent boils and low back pain. The boils, previously diagnosed as MRSA, have been recurring for an unspecified duration and are currently located in the groin and axilla. He describes the boils as 'big' and painful, but deny fever and drainage. He has been using a topical antibiotic ointment prescribed by his infectious disease doctor.  The patient also reports low back pain that radiates down the left leg, associated with numbness and tingling. 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 He was prescribed Cymbalta and gabapentin for the pain, but has not been taking them due to a recent incarceration.     EMG study revealed:   Past Medical History:  Diagnosis Date   HIV (human immunodeficiency virus infection) (HCC)     No past surgical history on file.  No family history on file.  Social History   Socioeconomic History   Marital status: Single    Spouse name: Not on file   Number of children: Not on file   Years of education: Not on file   Highest education level: Not on file  Occupational History   Not on file  Tobacco Use   Smoking status: Every Day    Packs/day: .5    Types: Cigarettes   Smokeless tobacco: Never  Vaping Use   Vaping Use: Never used  Substance and Sexual Activity   Alcohol use: Not Currently    Comment: none in a month   Drug use: Yes     Types: Marijuana    Comment: daily   Sexual activity: Not on file    Comment: declined condoms  Other Topics Concern   Not on file  Social History Narrative   Not on file   Social Determinants of Health   Financial Resource Strain: Not on file  Food Insecurity: Not on file  Transportation Needs: Not on file  Physical Activity: Not on file  Stress: Not on file  Social Connections: Not on file    Allergies  Allergen Reactions   Dapsone Other (See Comments)    Other reaction(s): Other (See Comments) G6PD deficiency   Other Other (See Comments)    Seafood - can't speak   Primaquine     Other reaction(s): Other (See Comments) G6PD deficiency    Outpatient Medications Prior to Visit  Medication Sig Dispense Refill   bictegravir-emtricitabine-tenofovir AF (BIKTARVY) 50-200-25 MG TABS tablet Take 1 tablet by mouth daily. 30 tablet 11   clotrimazole (LOTRIMIN) 1 % cream Apply 1 Application topically 2 (two) times daily. 85 g 3   [employer] Diagnosis- lower back pain 1 each 0   mupirocin ointment (BACTROBAN) 2 % Apply 1 Application topically 2 (two) times daily. X 1 week Prn boils 30 g 2   valACYclovir (VALTREX) 500 MG tablet TAKE 1 TABLET BY MOUTH TWICE DAILY 60 tablet 1  DULoxetine (CYMBALTA) 30 MG capsule Take 1 capsule (30 mg total) by mouth daily. 30 capsule 3   gabapentin (NEURONTIN) 300 MG capsule Take 1 in the morning and 2 at bedtime 90 capsule 1   No facility-administered medications prior to visit.     ROS Review of Systems  Constitutional:  Negative for activity change and appetite change.  HENT:  Negative for sinus pressure and sore throat.   Respiratory:  Negative for chest tightness, shortness of breath and wheezing.   Cardiovascular:  Negative for chest pain and palpitations.  Gastrointestinal:  Negative for abdominal distention, abdominal pain and constipation.  Genitourinary: Negative.   Musculoskeletal:  Positive for back pain and gait  problem.  Skin:  Positive for rash.  Neurological:  Positive for numbness.  Psychiatric/Behavioral:  Negative for behavioral problems and dysphoric mood.     Objective:  BP 105/65   Pulse 71   Temp 99 F (37.2 C)   Wt 185 lb 6.4 oz (84.1 kg)   SpO2 98%   BMI 25.14 kg/m      11/16/2022    2:34 PM 10/26/2022    2:18 PM 10/08/2022    8:58 AM  BP/Weight  Systolic BP 105 131 135  Diastolic BP 65 83 89  Wt. (Lbs) 185.4 191   BMI 25.14 kg/m2 25.9 kg/m2       Physical Exam Constitutional:      Appearance: He is well-developed.  Cardiovascular:     Rate and Rhythm: Normal rate.     Heart sounds: Normal heart sounds. No murmur heard. Pulmonary:     Effort: Pulmonary effort is normal.     Breath sounds: Normal breath sounds. No wheezing or rales.  Chest:     Chest wall: No tenderness.  Abdominal:     General: Bowel sounds are normal. There is no distension.     Palpations: Abdomen is soft. There is no mass.     Tenderness: There is no abdominal tenderness.  Musculoskeletal:        General: Normal range of motion.     Right lower leg: No edema.     Left lower leg: No edema.     Comments: Left lumbar spine tenderness  Skin:    Comments: Furuncle in right axilla and suprapubic region, no erythema, no drainage  Neurological:     Mental Status: He is alert and oriented to person, place, and time.  Psychiatric:        Mood and Affect: Mood normal.        Latest Ref Rng & Units 05/13/2022    9:22 AM 07/02/2021    4:03 PM 06/16/2021    1:20 AM  CMP  Glucose 65 - 99 mg/dL 68  86  914   BUN 7 - 25 mg/dL 8  11  5    Creatinine 0.70 - 1.30 mg/dL 7.82  9.56  2.13   Sodium 135 - 146 mmol/L 142  139  134   Potassium 3.5 - 5.3 mmol/L 4.3  4.3  4.0   Chloride 98 - 110 mmol/L 106  103  103   CO2 20 - 32 mmol/L 29  29  25    Calcium 8.6 - 10.3 mg/dL 9.5  9.4  7.3   Total Protein 6.1 - 8.1 g/dL 8.0  8.0    Total Bilirubin 0.2 - 1.2 mg/dL 0.6  0.2    AST 10 - 35 U/L 14  16    ALT 9 -  46 U/L  14  20      Lipid Panel     Component Value Date/Time   CHOL 99 05/13/2022 0922   TRIG 78 05/13/2022 0922   HDL 34 (L) 05/13/2022 0922   CHOLHDL 2.9 05/13/2022 0922   LDLCALC 49 05/13/2022 0922    CBC    Component Value Date/Time   WBC 4.5 05/13/2022 0922   RBC 5.23 05/13/2022 0922   HGB 15.3 05/13/2022 0922   HGB 12.3 (L) 07/21/2021 1531   HCT 46.6 05/13/2022 0922   HCT 36.9 (L) 07/21/2021 1531   PLT 331 05/13/2022 0922   PLT 277 07/21/2021 1531   MCV 89.1 05/13/2022 0922   MCV 90 07/21/2021 1531   MCH 29.3 05/13/2022 0922   MCHC 32.8 05/13/2022 0922   RDW 11.6 05/13/2022 0922   RDW 17.7 (H) 07/21/2021 1531   LYMPHSABS 1,737 05/13/2022 0922   LYMPHSABS 1.7 07/21/2021 1531   MONOABS 0.6 06/12/2021 1130   EOSABS 248 05/13/2022 0922   EOSABS 1.1 (H) 07/21/2021 1531   BASOSABS 32 05/13/2022 0922   BASOSABS 0.0 07/21/2021 1531    Lab Results  Component Value Date   HGBA1C 4.8 07/21/2021    Assessment & Plan:      Recurrent Boils:  Patient reports recurrent boils in the axillary and groin regions. No current drainage or fever. History of MRSA. -Start oral antibiotics -doxycycline -Advise warm compresses to affected areas.  Lower Back Pain and Neuropathy: Patient reports persistent lower back pain radiating down the left leg with associated numbness and tingling. Recent EMG suggestive of neuropathy likely secondary to HIV and possible sciatica, Vitamin B12 deficiency also noted. Patient has not started prescribed Cymbalta due to recent incarceration. -Resend prescription for Cymbalta and Gabapentin to [facility] in [place]. -Advise patient to pick up medications from pharmacy.  HIV: Patient is under the care of an infectious disease specialist, Dr. Luciana Axe. -Continue current HIV medications as prescribed by Dr. Luciana Axe. -Advise patient to follow up with Dr. Luciana Axe as scheduled.           Meds ordered this encounter  Medications   DULoxetine (CYMBALTA)  30 MG capsule    Sig: Take 1 capsule (30 mg total) by mouth daily.    Dispense:  30 capsule    Refill:  3   gabapentin (NEURONTIN) 300 MG capsule    Sig: Take 1 in the morning and 2 at bedtime    Dispense:  90 capsule    Refill:  1   doxycycline (VIBRA-TABS) 100 MG tablet    Sig: Take 1 tablet (100 mg total) by mouth 2 (two) times daily.    Dispense:  14 tablet    Refill:  0    Follow-up: Return in about 6 months (around 05/19/2023) for Chronic medical conditions.       Hoy Register, MD, FAAFP. [facility] [place], [place] [phone]   11/16/2022, 3:08 PM

## 2022-11-23 NOTE — Progress Notes (Deleted)
   Subjective:    Patient ID: Mason Moran, male    DOB: 1970-12-11, 52 y.o.   MRN: [id number]  HPI   Pain Inventory Average Pain {NUMBERS; 0-10:5044} Pain Right Now {NUMBERS; 0-10:5044} My pain is {PAIN DESCRIPTION:21022940}  In the last 24 hours, has pain interfered with the following? General activity {NUMBERS; 0-10:5044} Relation with others {NUMBERS; 0-10:5044} Enjoyment of life {NUMBERS; 0-10:5044} What TIME of day is your pain at its worst? {time of day:24191} Sleep (in general) {BHH GOOD/FAIR/POOR:22877}  Pain is worse with: {ACTIVITIES:21022942} Pain improves with: {PAIN IMPROVES WUJW:11914782} Relief from Meds: {NUMBERS; 0-10:5044}  No family history on file. Social History   Socioeconomic History   Marital status: Single    Spouse name: Not on file   Number of children: Not on file   Years of education: Not on file   Highest education level: Not on file  Occupational History   Not on file  Tobacco Use   Smoking status: Every Day    Current packs/day: 0.50    Types: Cigarettes   Smokeless tobacco: Never  Vaping Use   Vaping status: Never Used  Substance and Sexual Activity   Alcohol use: Not Currently    Comment: none in a month   Drug use: Yes    Types: Marijuana    Comment: daily   Sexual activity: Not on file    Comment: declined condoms  Other Topics Concern   Not on file  Social History Narrative   Not on file   Social Determinants of Health   Financial Resource Strain: Not on file  Food Insecurity: Not on file  Transportation Needs: Not on file  Physical Activity: Not on file  Stress: Not on file  Social Connections: Not on file   No past surgical history on file. No past surgical history on file. Past Medical History:  Diagnosis Date   HIV (human immunodeficiency virus infection) (HCC)    There were no vitals taken for this visit.  Opioid Risk Score:   Fall Risk Score:  `1  Depression screen PHQ 2/9     11/16/2022    2:43 PM  10/26/2022    2:26 PM 08/30/2022    1:40 PM 05/13/2022    8:29 AM 01/20/2022   10:28 AM 01/12/2022   10:40 AM 07/21/2021    2:47 PM  Depression screen PHQ 2/9  Decreased Interest 0 0 3 0 2 0 0  Down, Depressed, Hopeless 0 0 1 0 0 0 0  PHQ - 2 Score 0 0 4 0 2 0 0  Altered sleeping 0  1  2  0  Tired, decreased energy 0  1  3  3   Change in appetite 0  1  0  0  Feeling bad or failure about yourself  0  0  0  0  Trouble concentrating 0  3  0  1  Moving slowly or fidgety/restless 0  3  0  0  Suicidal thoughts 0  0  0  0  PHQ-9 Score 0  13  7  4     Review of Systems     Objective:   Physical Exam        Assessment & Plan:

## 2022-11-24 ENCOUNTER — Encounter
Payer: Medicare Other | Attending: Physical Medicine and Rehabilitation | Admitting: Physical Medicine and Rehabilitation

## 2022-11-24 DIAGNOSIS — R202 Paresthesia of skin: Secondary | ICD-10-CM | POA: Insufficient documentation

## 2022-11-24 DIAGNOSIS — R200 Anesthesia of skin: Secondary | ICD-10-CM | POA: Insufficient documentation

## 2022-12-27 ENCOUNTER — Ambulatory Visit: Payer: Self-pay | Admitting: *Deleted

## 2022-12-27 MED ORDER — CLINDAMYCIN HCL 300 MG PO CAPS: 300.0000 mg | ORAL_CAPSULE | Freq: Three times a day (TID) | ORAL | 0 refills | Status: DC

## 2022-12-27 NOTE — Telephone Encounter (Signed)
Summary: boils are coming back   Patient stated his boils have came back, up under arms and on butt cheek. Not painful but coming back in places and wants a call from a nurse about what to do, didn't know if an antibiotic could be called in again for this issue?         Reason for Disposition  Red tender lump < 1/2 inch across (< 12 mm; smaller than a marble)  Answer Assessment - Initial Assessment Questions 1. APPEARANCE of BOIL: "What does the boil look like?"      Little knots- small 2. LOCATION: "Where is the boil located?"      Same places- under arm, buttocks 3. NUMBER: "How many boils are there?"      2 4. SIZE: "How big is the boil?" (e.g., inches, cm; compare to size of a coin or other object)     Bug bite size 5. ONSET: "When did the boil start?"     yesterday 6. PAIN: "Is there any pain?" If Yes, ask: "How bad is the pain?"   (Scale 1-10; or mild, moderate, severe)     Sore- under arm 7. FEVER: "Do you have a fever?" If Yes, ask: "What is it, how was it measured, and when did it start?"      no 8. SOURCE: "Have you been around anyone with boils or other Staph infections?" "Have you ever had boils before?"     MRSA hx 9. OTHER SYMPTOMS: "Do you have any other symptoms?" (e.g., shaking chills, weakness, rash elsewhere on body)     no  Protocols used: Boil (Skin Abscess)-A-AH

## 2022-12-27 NOTE — Telephone Encounter (Signed)
  Chief Complaint: recurrent boil-MRSA Symptoms: armpit, buttock- 2 boils- small at this time- mosquito bite - sore. Patient states he  has recurrent boils and wants to get treatmant before they get larger, painful and drain Frequency: recurrent- present since yesterday Pertinent Negatives: Patient denies fever Disposition: [] ED /[] Urgent Care (no appt availability in office) / [] Appointment(In office/virtual)/ []  [facility]/ [] Home Care/ [] Refused Recommended Disposition /[] [facility]/ [x]  Follow-up with PCP Additional Notes: Patient is requesting antibiotic for recurrent boils- patient would like something stronger than last antibiotic given for this. Patient advised he may need appointment- he does not have transportation until Th/Fr. Please let him know if Rx can be sent to pharmacy or if he has to come in. Patient uses [facility] location.

## 2022-12-28 NOTE — Telephone Encounter (Signed)
Patient aware the Rx is at pharmacy.

## 2023-01-04 IMAGING — CT CT CHEST W/ CM
2 of 4 series · 15 of 36 positions shown, 18 images · IV contrast (APPLIED)
Comparison: Chest radiographs done on 06/12/2021

CLINICAL DATA: Pneumonia, evaluate for complications

EXAM:
CT CHEST WITH CONTRAST
TECHNIQUE: Multidetector CT imaging of the chest was performed during
intravenous contrast administration.

[Series 3: thorax 2.0 i31f 2 · axial · 0.70mm/px · z∈[+1220,+1502]mm · 12 of 167 slices shown, 15 images]
[im 13/167  mediastinal]
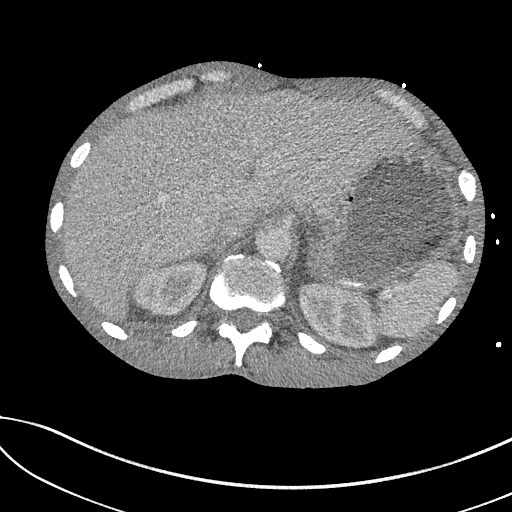
[im 13/167  lung]
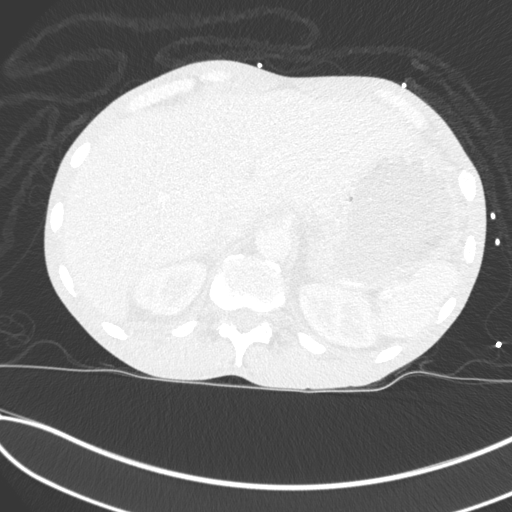
[im 26/167  lung]
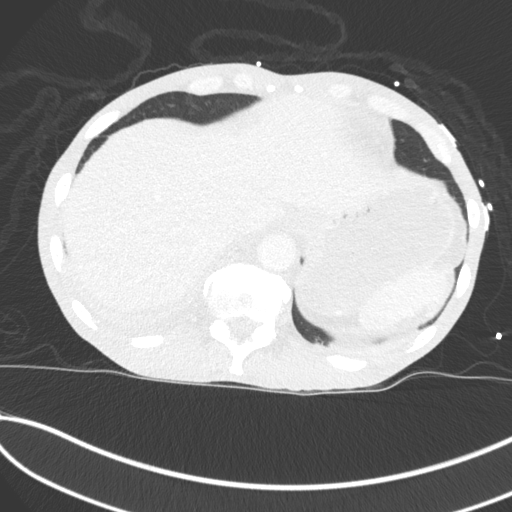
[im 39/167  lung]
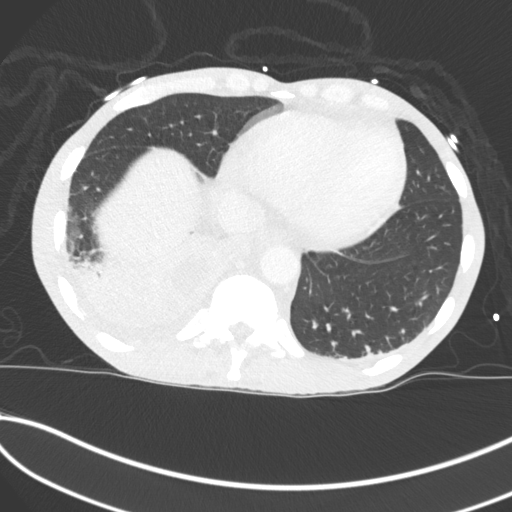
[im 52/167  lung]
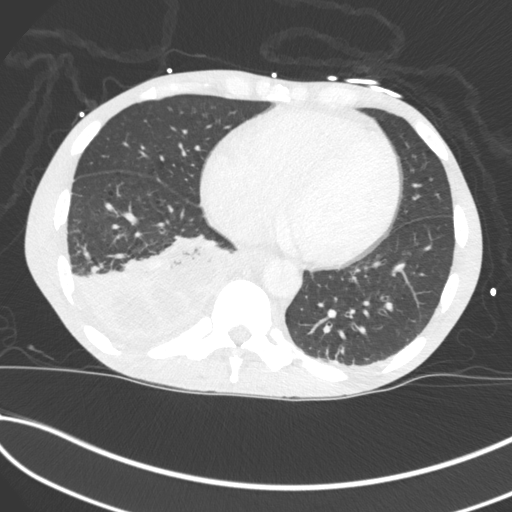
[im 64/167  mediastinal]
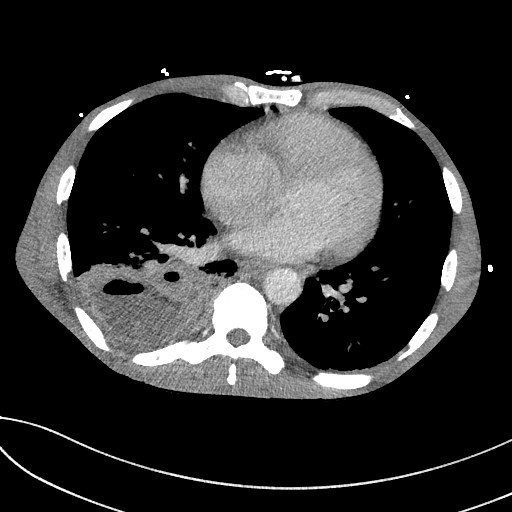
[im 64/167  lung]
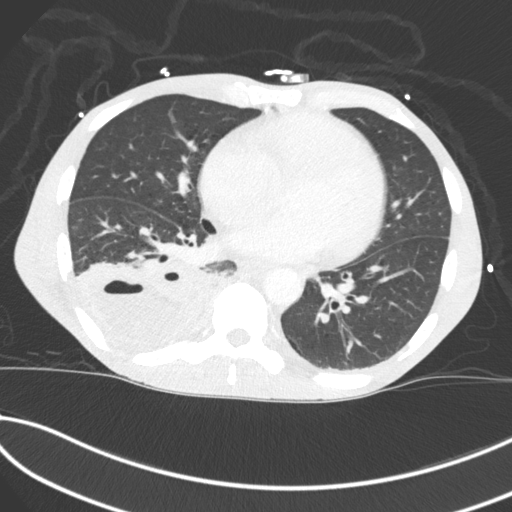
[im 77/167  lung]
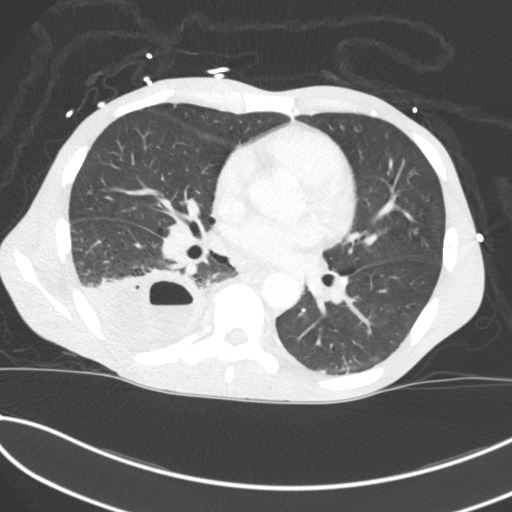
[im 90/167  lung]
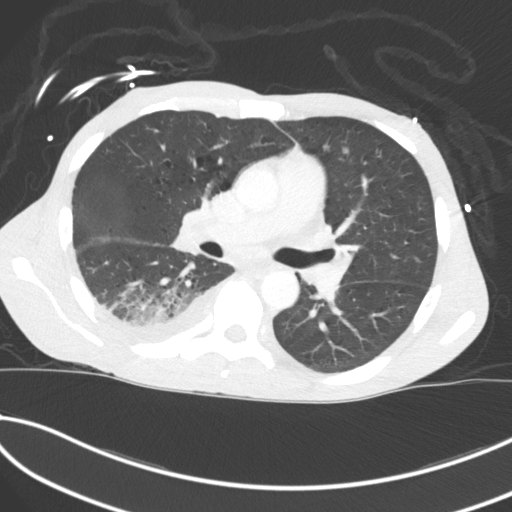
[im 103/167  lung]
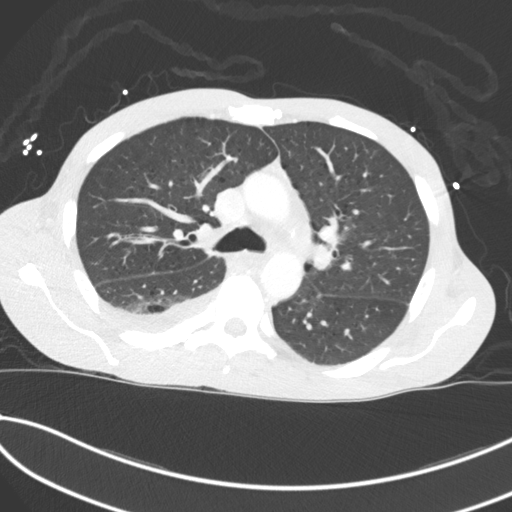
[im 115/167  mediastinal]
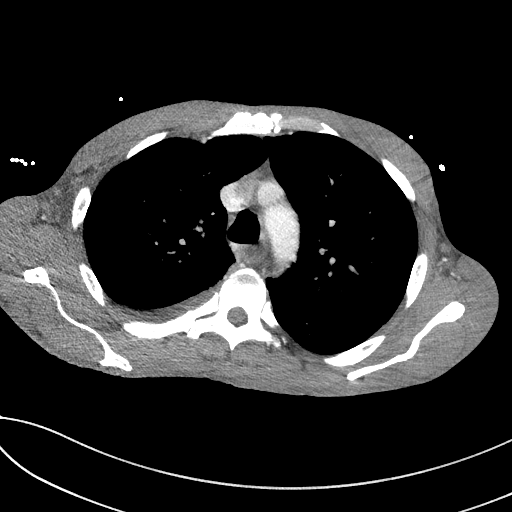
[im 115/167  lung]
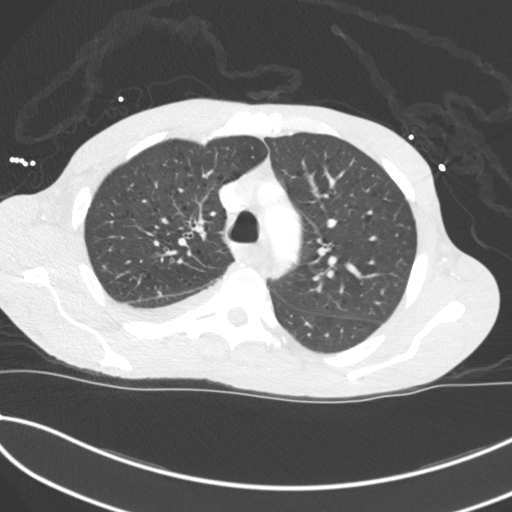
[im 128/167  lung]
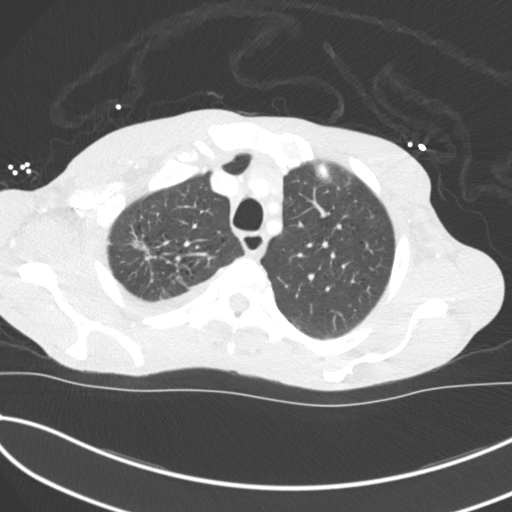
[im 141/167  lung]
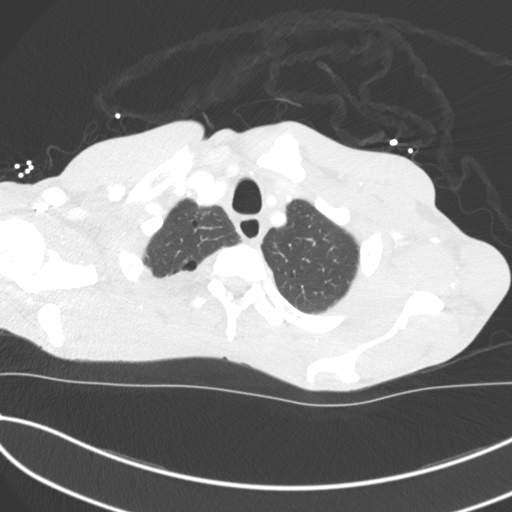
[im 154/167  lung]
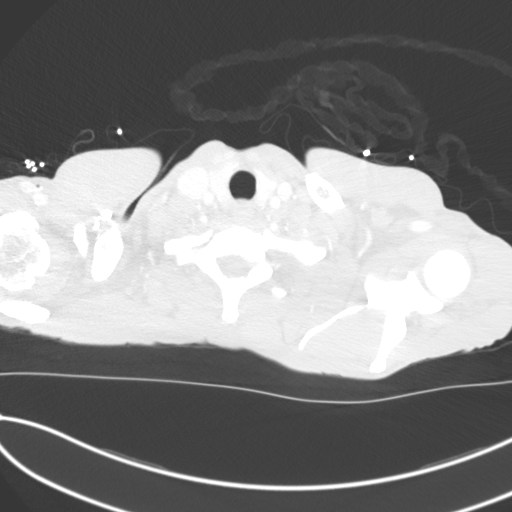

[Series 5: coronal · coronal · 0.68mm/px · 3 of 118 slices shown]
[im 24/118  lung]
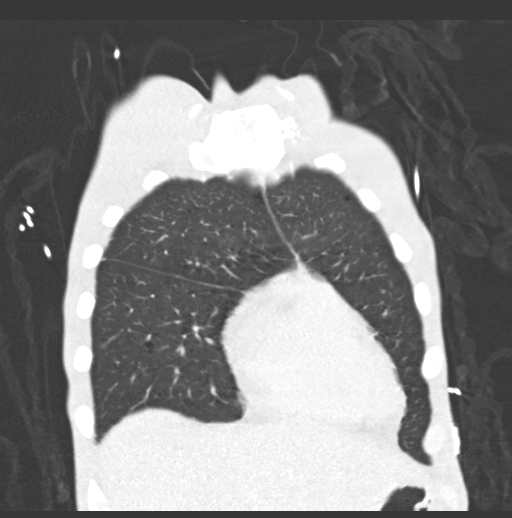
[im 47/118  lung]
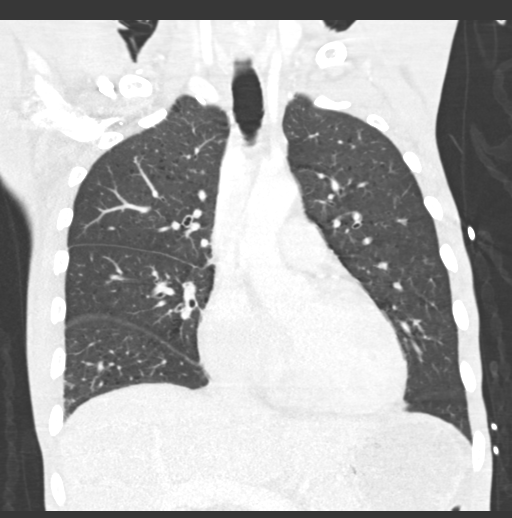
[im 71/118  lung]
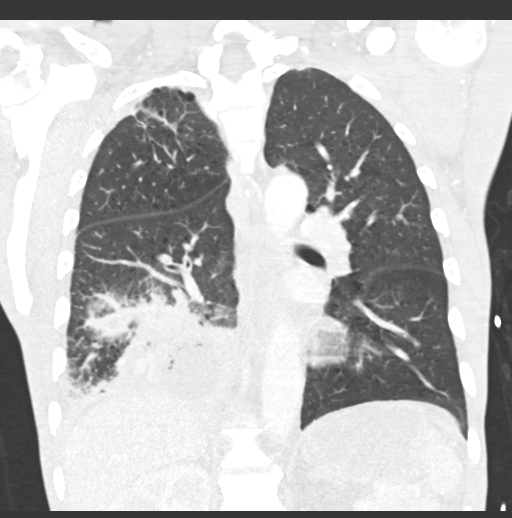

[15 of 36 positions shown; findings below may reference images not displayed]

RADIATION DOSE REDUCTION: This exam was performed according to the
departmental dose-optimization program which includes automated
exposure control, adjustment of the mA and/or kV according to
patient size and/or use of iterative reconstruction technique.

CONTRAST:  75mL OMNIPAQUE IOHEXOL 300 MG/ML  SOLN
FINDINGS: Cardiovascular: Heart is enlarged in size. Small pericardial
effusion is present.

Mediastinum/Nodes: No significant lymphadenopathy seen.

Lungs/Pleura: There is large infiltrate in the posterior right lower
lobe. There are multiple loculated fluid collections with air-fluid
levels within the infiltrate. Largest of these collections measures
approximately 7.2 cm. Small right pleural effusion is present. There
are no pockets of air in the right pleural effusion. Small
subpleural patchy density is seen in the left lower lobe. There is
small linear density in the posterior right apex, possibly
suggesting scarring. There are few small pleural-based nodules in
the right upper lung fields each measuring less than 4 mm. There is
mild ectasia of bronchi.

Upper Abdomen: Liver is enlarged in size.

Musculoskeletal: Unremarkable.
IMPRESSION: There is large infiltrate in the posterior right lower lobe
suggesting pneumonia. There are multiple loculated fluid collections
with air-fluid levels within this infiltrate. Findings suggest
necrotic pneumonia with multiple lung abscesses largest measuring
7.2 cm in diameter. Small right pleural effusion is present. There
is no pneumothorax.

Small patchy infiltrate is seen in the subpleural location in the
posterior left lower lobe suggesting atelectasis/pneumonia. Linear
density in the right apex may suggest scarring.

Small pericardial effusion is present.

These results will be called to the ordering clinician or
representative by the Radiologist Assistant, and communication
documented in the PACS or [REDACTED].

## 2023-01-04 IMAGING — US US ABDOMEN LIMITED
1 series · 14 of 25 positions shown · non-contrast
Comparison: None.

CLINICAL DATA: Transaminitis

EXAM:
ULTRASOUND ABDOMEN LIMITED RIGHT UPPER QUADRANT

[Series 1: us abdomen limited ruq (liver/gb) · 14 of 66 slices shown]
[im 1/66]
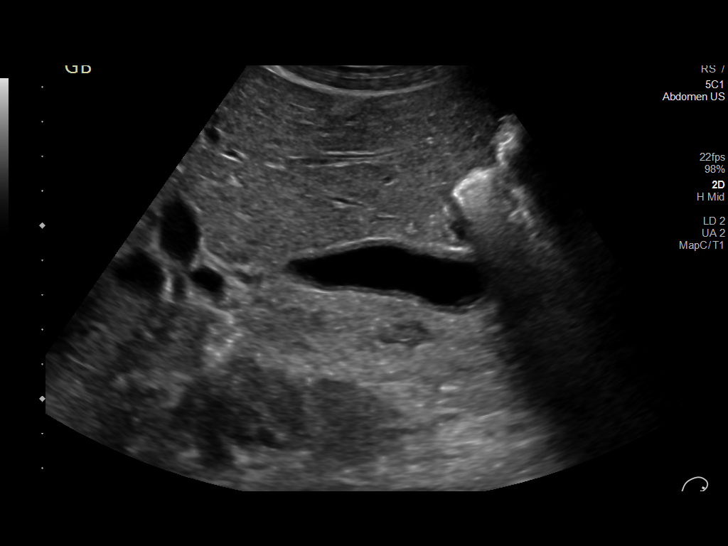
[im 6/66]
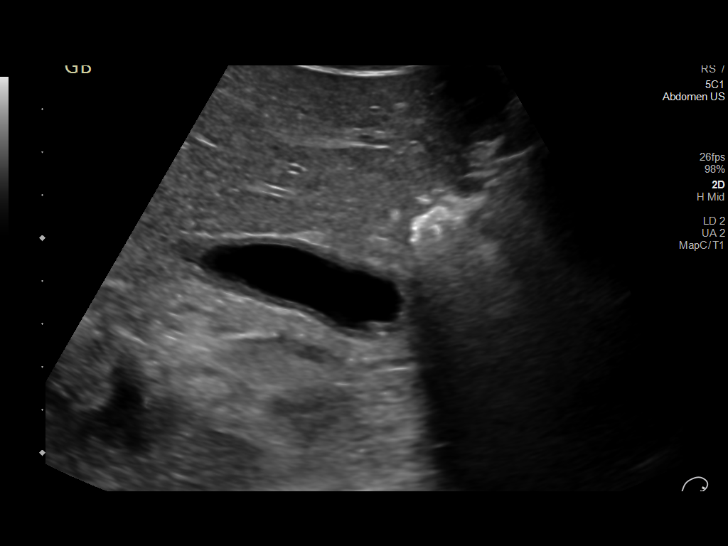
[im 11/66]
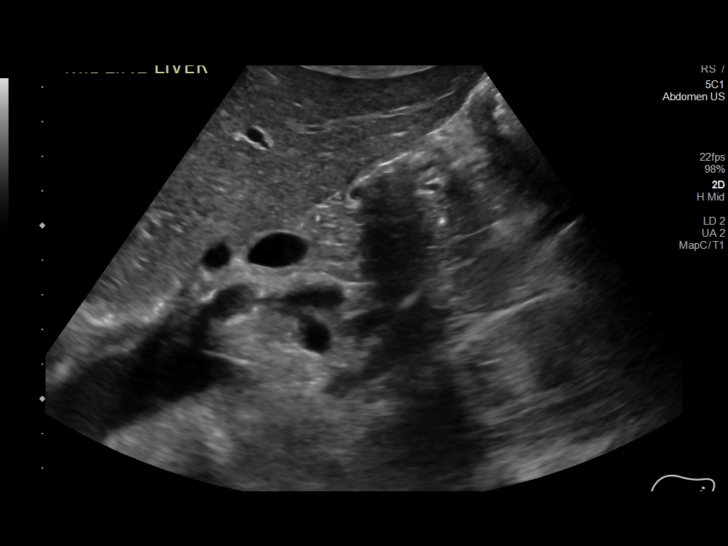
[im 17/66]
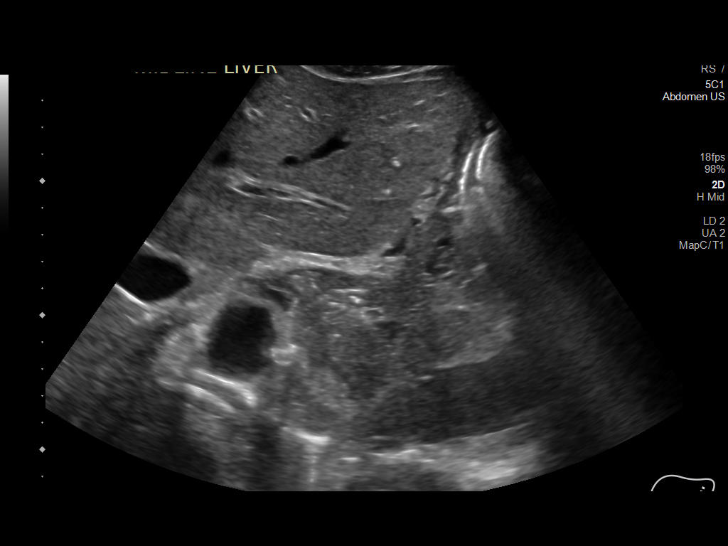
[im 22/66]
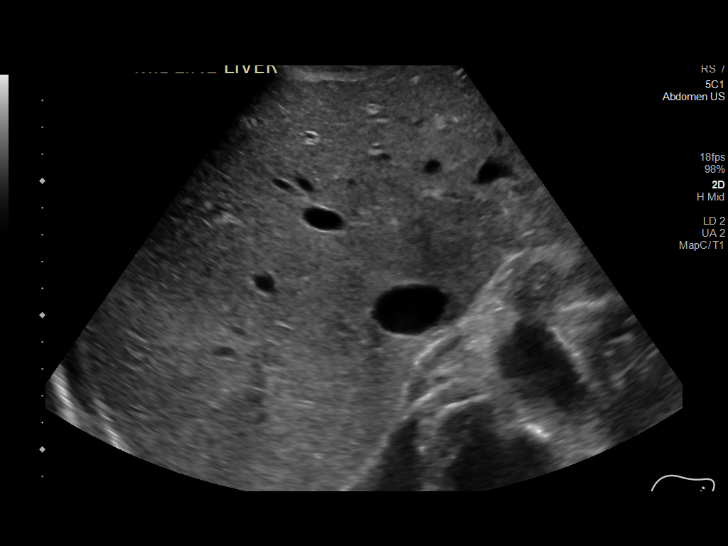
[im 25/66]
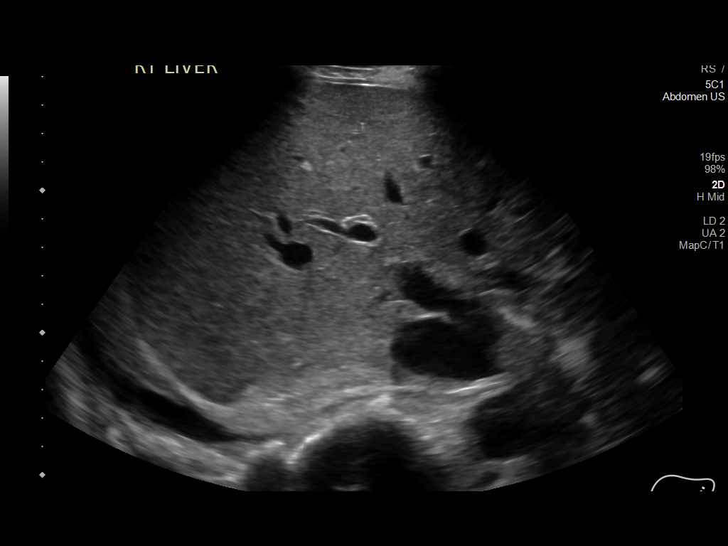
[im 30/66]
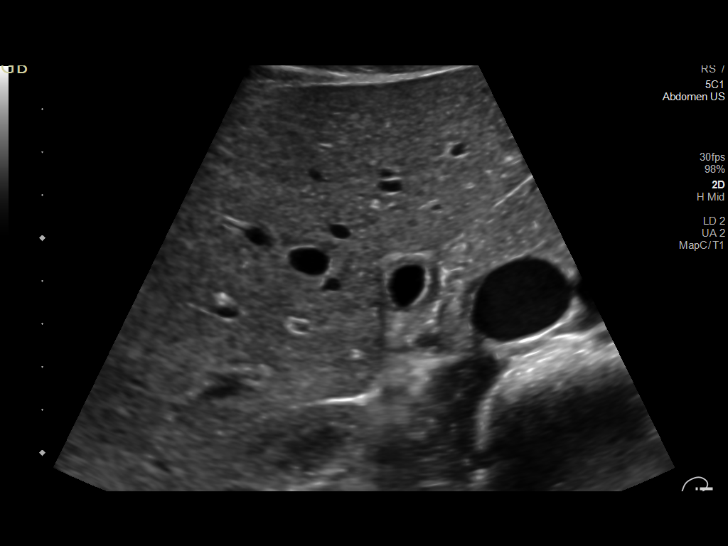
[im 36/66]
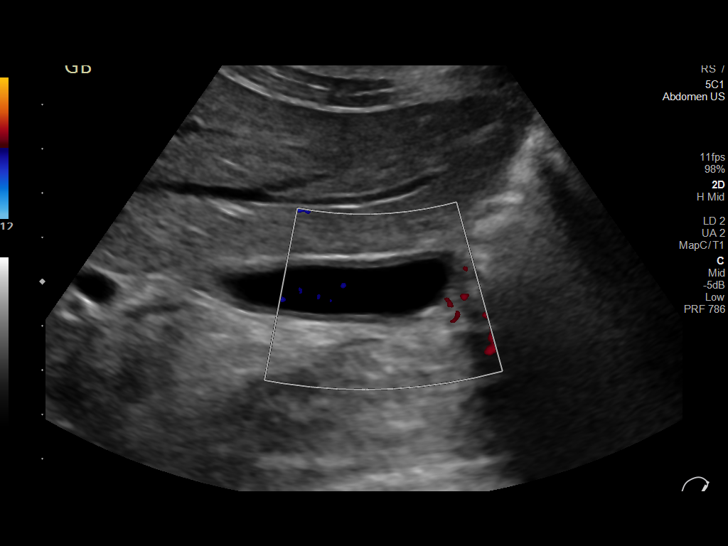
[im 41/66]
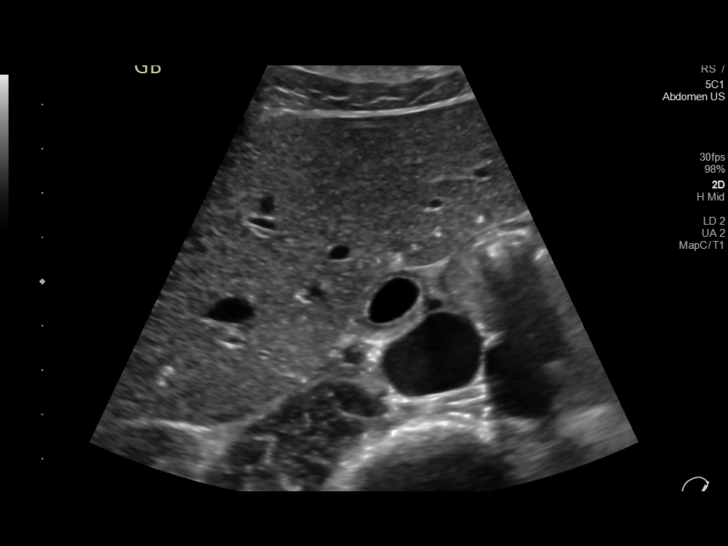
[im 44/66]
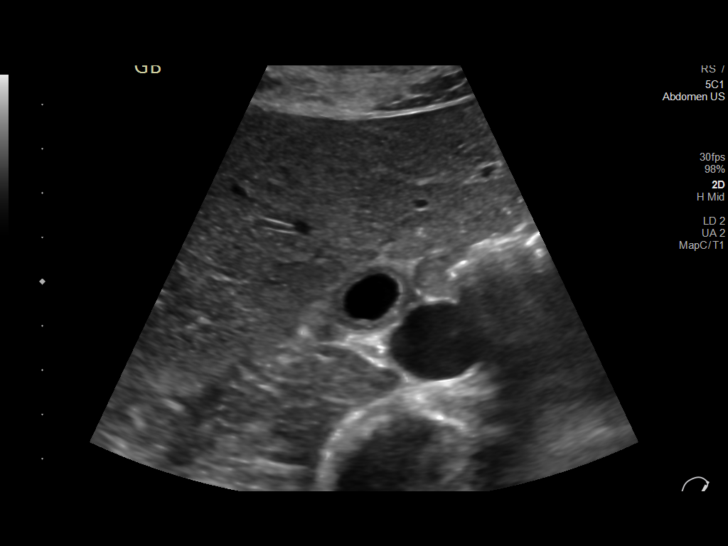
[im 49/66]
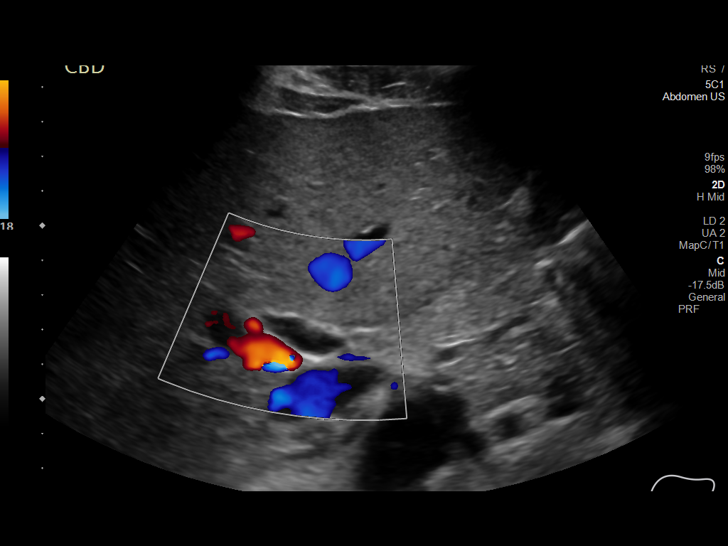
[im 55/66]
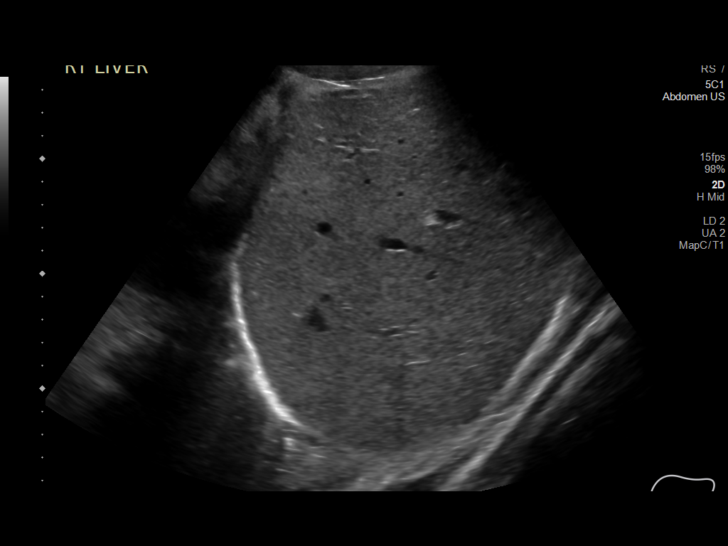
[im 60/66]
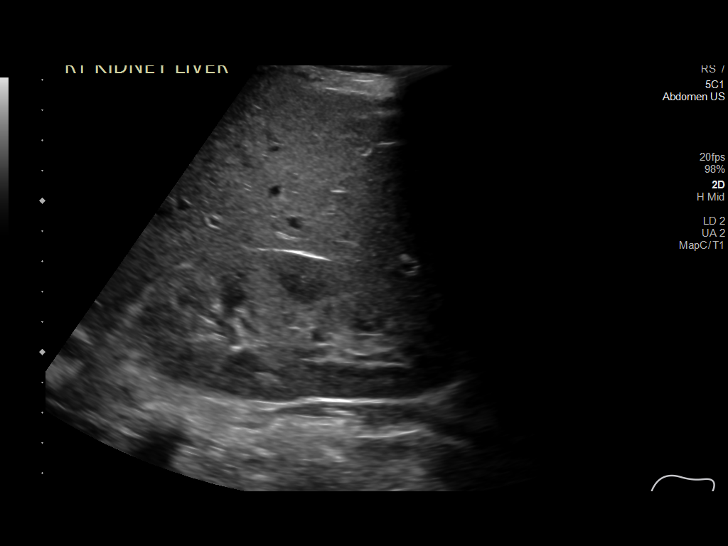
[im 66/66]
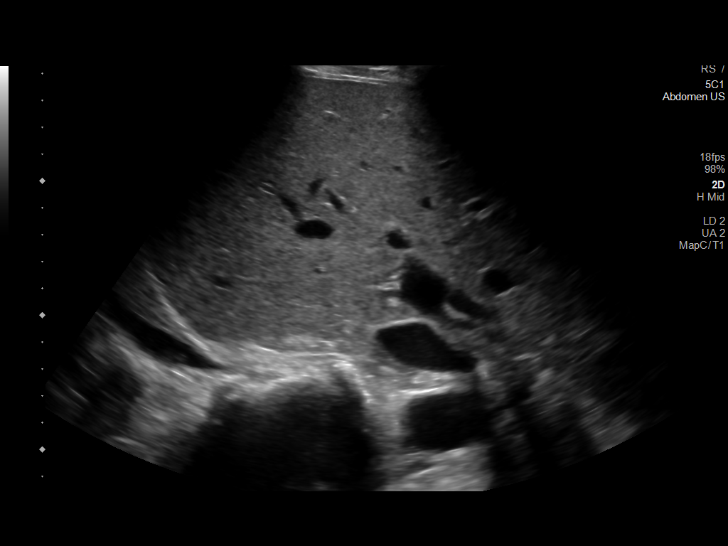

[14 of 25 positions shown; findings below may reference images not displayed]

FINDINGS: Gallbladder:

Gallbladder is contracted. No gallbladder wall thickening or
pericholecystic fluid. Questionable 5 mm stone within the
gallbladder fundus region. No sonographic Murphy's sign elicited
during the exam.

Common bile duct:

Diameter: 8 mm

Liver:

No focal lesion identified. Within normal limits in parenchymal
echogenicity. Portal vein is patent on color Doppler imaging with
normal direction of blood flow towards the liver.

Other: RIGHT pleural effusion.
IMPRESSION: 1. Gallbladder is unremarkable. No evidence of cholecystitis.
Questionable 5 mm gallstone, versus artifact.
2. CBD dilatation, measuring 8 mm diameter. Recommend correlation
with liver function tests. If abnormal LFTs, when the patient is
clinically stable and able to follow directions and hold their
breath (preferably as an outpatient), further evaluation with
dedicated MRCP should be considered. Otherwise, consider follow-up
RIGHT upper quadrant ultrasound in 3-6 months to ensure stability.

## 2023-01-11 ENCOUNTER — Ambulatory Visit: Payer: Self-pay | Admitting: *Deleted

## 2023-01-11 MED ORDER — CLINDAMYCIN HCL 300 MG PO CAPS: 300.0000 mg | ORAL_CAPSULE | Freq: Three times a day (TID) | ORAL | 0 refills | Status: DC

## 2023-01-11 NOTE — Telephone Encounter (Addendum)
Summary: boils/medication re sent   Pt states that his boils are getting worse and more are popping up on him. Pt states that he forgot to pick up his prescription that was sent to the pharmacy for him on 12/27/2022: clindamycin (CLEOCIN) 300 MG capsule. Pt states that he had his sister go to the pharmacy to pick it up last week and it was not there. Pt is wanting to see if it can be re sent back to the pharmacy.      Attempted to call patient- no answer- VM not set up- call regarding possible:   clindamycin (CLEOCIN) 300 MG capsule  Rx marked as phone in  Patient requested Rx at [facility]- not sent there  Please review for Rx corrections

## 2023-01-11 NOTE — Telephone Encounter (Signed)
Spoke with patient. Patient said the he never picked-up cleocin, because he forgot it. Patient said when he realized he had not pick it up, He sent his sister to do so and it was not there and had not been place on hold. Cleocin resent to pharmacy PCP aware . Patient aware

## 2023-01-11 NOTE — Telephone Encounter (Signed)
  Chief Complaint: Rx requested 8/19- per note- not at pharmacy Symptoms: boils  Disposition: [] ED /[] Urgent Care (no appt availability in office) / [] Appointment(In office/virtual)/ []  [place] [facility]/ [] Home Care/ [] Refused Recommended Disposition /[] [place] [facility]/ [x]  Follow-up with PCP Additional Notes: Rx needs correction- marked as phone in, wrong pharmacy- sent to office for correction  Reason for Disposition . [1] Caller has URGENT medicine question about med that PCP or specialist prescribed AND [2] triager unable to answer question  Answer Assessment - Initial Assessment Questions 1. NAME of MEDICINE: "What medicine(s) are you calling about?"     clindamycin (CLEOCIN) 300 MG capsule 2. QUESTION: "What is your question?" (e.g., double dose of medicine, side effect)     See notes- need a at different pharmacy- marked as phone in 3. PRESCRIBER: "Who prescribed the medicine?" Reason: if prescribed by specialist, call should be referred to that group.     PCP 4. SYMPTOMS: "Do you have any symptoms?" If Yes, ask: "What symptoms are you having?"  "How bad are the symptoms (e.g., mild, moderate, severe)     boils  Protocols used: Medication Question Call-A-AH

## 2023-01-11 NOTE — Addendum Note (Signed)
Addended by: Elsie Lincoln F on: 01/11/2023 12:50 PM   Modules accepted: Orders

## 2023-01-13 ENCOUNTER — Other Ambulatory Visit: Payer: Self-pay

## 2023-01-13 MED ORDER — CLINDAMYCIN HCL 300 MG PO CAPS: 300.0000 mg | ORAL_CAPSULE | Freq: Three times a day (TID) | ORAL | 0 refills | Status: DC

## 2023-01-21 NOTE — Progress Notes (Signed)
The ASCVD Risk score (Arnett DK, et al., 2019) failed to calculate for the following reasons:   The valid total cholesterol range is 130 to 320 mg/dL  Sandie Ano, RN

## 2023-01-26 ENCOUNTER — Telehealth: Payer: Self-pay | Admitting: *Deleted

## 2023-01-26 ENCOUNTER — Ambulatory Visit: Payer: Self-pay

## 2023-01-26 NOTE — Telephone Encounter (Signed)
Summary: boils   Patient called in stated his boils have come back on his left side and request an antibiotic to treat them. Please f/u with patient.          Voice mailbox not set up. Unable to leave a message.

## 2023-01-26 NOTE — Telephone Encounter (Signed)
Patient called to report ankle swelling. He is supposed to be placed on house arrest and a ankle monitor is being put on today. Patient advised to contact his pcp due to provider being on vacation. Nothing further needed.

## 2023-01-26 NOTE — Telephone Encounter (Signed)
Unable to reach. Voicemail not set up.

## 2023-01-26 NOTE — Telephone Encounter (Signed)
Reason for Disposition  2 or more boils  Answer Assessment - Initial Assessment Questions 1. APPEARANCE of BOIL: "What does the boil look like?"      I have boils forming in  my left armpit.  They were in my right armpit but this time I'm getting them in my left armpit.   I was calling to see if Dr. Alvis Lemmings could call me in a rx for it.  She did that for me once before.   I'm on full Medicaid now so I have to give at least a 48 hour notice to get a ride.   2. LOCATION: "Where is the boil located?"      In left armpit 3. NUMBER: "How many boils are there?"      It's patch that is forming now. 4. SIZE: "How big is the boil?" (e.g., inches, cm; compare to size of a coin or other object)     I'm getting an ankle monitor put on me right now (He keeps talking with someone in the background).  So I have to give a notice if I need to come in.  5. ONSET: "When did the boil start?"     They are just starting to form.  I've had these so many times I can feel when they are coming on. 6. PAIN: "Is there any pain?" If Yes, ask: "How bad is the pain?"   (Scale 1-10; or mild, moderate, severe)     Mildly 7. FEVER: "Do you have a fever?" If Yes, ask: "What is it, how was it measured, and when did it start?"      Not asked 8. SOURCE: "Have you been around anyone with boils or other Staph infections?" "Have you ever had boils before?"     I've had these before 9. OTHER SYMPTOMS: "Do you have any other symptoms?" (e.g., shaking chills, weakness, rash elsewhere on body)     Not asked as he was talking with someone in the background and was trying to carry on a conversation with me and the person putting the ankle monitor on him. 10. PREGNANCY: "Is there any chance you are pregnant?" "When was your last menstrual period?"       N/A  Protocols used: Boil (Skin Abscess)-A-AH  Chief Complaint: Boils coming up under his left armpit.   Requesting an antibiotic be called in.  Symptoms: He has had these before.    (Someone was there putting a ankle monitor on him so short triage done).  Dr. Alvis Lemmings has called them in for me before.   I have to give a 48 hour notice to get a ride anywhere now. Frequency: N/A Pertinent Negatives: Patient denies knowing why he keeps getting these boils. Disposition: [] ED /[] Urgent Care (no appt availability in office) / [] Appointment(In office/virtual)/ []  [facility]/ [] Home Care/ [] Refused Recommended Disposition /[]  Mobile Bus/ [x]  Follow-up with PCP Additional Notes: I have sent a message to Dr. Alvis Lemmings with your request for an antibiotic to be called into the [facility] in [place].

## 2023-01-27 ENCOUNTER — Telehealth: Payer: Self-pay | Admitting: Family Medicine

## 2023-01-27 MED ORDER — DOXYCYCLINE HYCLATE 100 MG PO TABS: 100.0000 mg | ORAL_TABLET | Freq: Two times a day (BID) | ORAL | 0 refills | Status: DC

## 2023-01-27 NOTE — Addendum Note (Signed)
Addended by: Hoy Register on: 01/27/2023 05:14 PM   Modules accepted: Orders

## 2023-01-27 NOTE — Telephone Encounter (Signed)
Unfortunately, he wasn't able to answer his phone on yesterday. I have sent the message to the provider to advise whether or not she can send in a Rx.   I was calling him yesterday to see if he was able to do a Walkin appt this morning at 0830 or a VV at 0810.    Those options are no longer available for him... so I am/ we are waiting to hear back from his PCP.

## 2023-01-27 NOTE — Telephone Encounter (Signed)
Pt is calling Travia back.In response to NT note. Please advise CB- [phone]

## 2023-01-27 NOTE — Telephone Encounter (Signed)
Rx already sent to pharmacy. Duplicate

## 2023-01-27 NOTE — Telephone Encounter (Signed)
Rx sent to the pharmacy.

## 2023-01-27 NOTE — Telephone Encounter (Addendum)
Patient has called back and stated he might just go to the emergency room for his boils. Spoke with Mason Moran and she stated they are waiting on provider to see if she will send in RX. He said no pain with the boils but they keep popping up and he knows he needs the medication to help. I let him know waiting on Dr Alvis Lemmings to send the RX in, and I advised patient today was her half day, then the call got disconnected. Patient stated his phone is acting up.

## 2023-01-31 NOTE — Telephone Encounter (Signed)
Unable to reach patient to inform them. Voicemail not set up.

## 2023-02-08 ENCOUNTER — Ambulatory Visit: Payer: Self-pay

## 2023-02-08 NOTE — Telephone Encounter (Signed)
  Chief Complaint: recurrent boils  Symptoms: boils under bilat armpits, multiple large areas and small clusters  Frequency: ongoing for months  Pertinent Negatives: Patient denies any drainage  Disposition: [] ED /[] Urgent Care (no appt availability in office) / [x] Appointment(In office/virtual)/ []  [facility] [facility]/ [] Home Care/ [] Refused Recommended Disposition /[]  Mobile Bus/ [x]  Follow-up with PCP Additional Notes: pt calling states that he is having worse boils now. Has more areas and are painful. He states the tablets he just finished didn't help but the capsule he took of 300mg  helped better. Reviewed med list and advised pf of Doxycycline and Clindamycin hx he has taken since July. Pt states that he would like Clindamycin sent in to see if that will help this time. Pt has upcoming appt on 02/17/23 and no sooner appts until 03/03/23. Advised pt I would send message back and have nurse FU with him if PCP sends in medication. Pt verbalized understanding.   Summary: Boils in armpits   Patient states that the boils in his armpits are getting worse. Patient said the antibiotics did not help.         Reason for Disposition  [1] Caller has URGENT question AND [2] triager unable to answer question  Answer Assessment - Initial Assessment Questions 1. APPEARANCE: "What does the boil (abscess) look like?"      Boils under both armpits now.  3. NUMBER: "How many boils are there?"      Right 2 big areas, Left 1 big area and clusters of small ones  4. SIZE: "How big is the boil?" (e.g., inches, cm; compare to size of a coin or other object)     Big and painful  5. ONSET: "When did the boil start?"     Ongoing for months  6. PAIN: "Is there any pain?" If Yes, ask: "How bad is the pain?"  (Scale 1-10; or mild, moderate, severe)     Yes  8. TREATMENT: "What treatment did you get or are you getting for the boil?" (e.g., I&D, antibiotics, moist heat)     Has been on Doxycycline and  Clindamycin, doxycycline didn't help but the Clindamycin did for a while  Protocols used: Boil (Skin Abscess) on Treatment Follow-up Call-A-AH

## 2023-02-09 ENCOUNTER — Encounter: Payer: Medicare HMO | Attending: Physical Medicine and Rehabilitation | Admitting: Physical Medicine and Rehabilitation

## 2023-02-09 VITALS — BP 121/78 | HR 62 | Ht 72.0 in | Wt 167.0 lb

## 2023-02-09 DIAGNOSIS — B20 Human immunodeficiency virus [HIV] disease: Secondary | ICD-10-CM | POA: Diagnosis not present

## 2023-02-09 DIAGNOSIS — R2681 Unsteadiness on feet: Secondary | ICD-10-CM | POA: Diagnosis not present

## 2023-02-09 DIAGNOSIS — M5442 Lumbago with sciatica, left side: Secondary | ICD-10-CM | POA: Insufficient documentation

## 2023-02-09 DIAGNOSIS — G63 Polyneuropathy in diseases classified elsewhere: Secondary | ICD-10-CM | POA: Insufficient documentation

## 2023-02-09 MED ORDER — BACLOFEN 10 MG PO TABS: 10.0000 mg | ORAL_TABLET | Freq: Two times a day (BID) | ORAL | 3 refills | Status: DC | PRN

## 2023-02-09 NOTE — Telephone Encounter (Signed)
Call placed to patient unable to reach message left on VM.   

## 2023-02-09 NOTE — Telephone Encounter (Signed)
Please have him speak to his infectious disease specialist about this.  He has completed antibiotics and is still symptomatic.  That is their specialty and they would treat him appropriately.  Thanks.

## 2023-02-09 NOTE — Patient Instructions (Signed)
  Left-sided low back pain with left-sided sciatica, unspecified chronicity  Wean off of duloxetine gradually by taking 1 capsule daily for 1 week, then stopping.  We discussed also stopping gabapentin, we will continue it at this time and if mood swings do not improve after stopping duloxetine, we may reduce or adjust this medication.  Please call clinic in 2 weeks to let me know.  Although your EMG showed generalized peripheral neuropathy, there were also some changes in your cath and description of your pain is suspicious for a pinched nerve in your low back causing most of your symptoms.  I am sending you to physical therapy as below, and getting an MRI of your low back to evaluate this.  Depending on the results of the MRI and how PT goes, I may recommend you get an injection in your back to treat this.  Follow-up with me in 3 months, but please call sooner as instructed above.  Patient is currently not a candidate for controlled substance or narcotic pain management.  Gait instability I am sending you to physical therapy to work on gait stability and pain control in your low back.  Human immunodeficiency virus (HIV) disease (HCC) Polyneuropathy associated with underlying disease (HCC)  I gave you some information today about Qutenza, which can help with peripheral nerve pain.  Talk to your insurer about coverage for this, and if it is reasonable I would recommend it for the pain in your foot.  You would make an appointment with the office for this if it something you are interested in.

## 2023-02-09 NOTE — Progress Notes (Signed)
Subjective:    Patient ID: Mason Moran, male    DOB: 04-Oct-1970, 52 y.o.   MRN: [id number]  HPI  Mason Moran is a 52 y.o. year old male  who  has a past medical history of HIV (human immunodeficiency virus infection) (HCC).   They are presenting to PM&R clinic for follow up related to  LLE pain . They were referred by their PCP . Based on their presentation, LLE neuropathy ? Sciatica vs. L4-S1 radiculopathy vs. Polyneuropathy related to B12 deficiency, possibly HIV.  Marland Kitchen  Plan from last visit: Polyneuropathy associated with underlying disease Follow up for EMG bilateral LE with myself or Dr. Wynn Banker   **update 6/19: Result of EMG with Dr. Wynn Banker show bilateral axonal sensorimotor polyneuropathy, slightly worse on L than R, with single PSW in L gastroc but no other findings to suggest radiculopathy**   Follow up with me in 3 months; messgae through [place] or call office in interim if needed Will avoid narcotic pain medications for now    Left-sided low back pain with left-sided sciatica, unspecified chronicity PT script sent for low back pain, gait stability Will order MRI low back pending results from EMG to plan for ESI if indicated Start Duloxetine 30 mg daily; after 2 weeks, can increase to twice daily if desired   ** Based on EMG results above, would not pursue MRI/ESI; will discuss at follow up** Gait instability PT referral Continue use of cane, recommend quad cane for stability instead of single point, DME order sent   Human immunodeficiency virus (HIV) disease States he is compliant with medications   Interval Hx:  - Therapies: Didn't get a call from PT as far as he knows; can't see his phone to pick up messages, can't get assistance with emails. No HEP.  Currently under house arrest for drug related charges in 2019.    - Follow ups: none   - Falls:none   - MWN:UUVOZ with a cane for stability   - Medications:   States he is going through a bottle of aleve  every week.   He doesn't like duloxetine, it makes him sleepy.  Gabapentin makes him lethargic ; he takes 2 at nighttime but it makes him feels feel drunk in the morning.    - Other concerns: Muscle spasms in his left low back "will cause me to straighten up or else I will fall over". Associated with activity especially pushing a shopping cart.   Separately shooting, burning pains down his left posterior thigh and down his lateral calf; becomes generalized in his foot. Does not happen in the right.    Pain Inventory Average Pain 7 Pain Right Now 7 My pain is sharp, stabbing, and tingling  In the last 24 hours, has pain interfered with the following? General activity 10 Relation with others 7 Enjoyment of life 0 What TIME of day is your pain at its worst? evening Sleep (in general) Fair  Pain is worse with: walking, bending, sitting, and standing Pain improves with:  . Relief from Meds:  .  No family history on file. Social History   Socioeconomic History   Marital status: Single    Spouse name: Not on file   Number of children: Not on file   Years of education: Not on file   Highest education level: Not on file  Occupational History   Not on file  Tobacco Use   Smoking status: Every Day    Current packs/day: 0.50  Types: Cigarettes   Smokeless tobacco: Never  Vaping Use   Vaping status: Never Used  Substance and Sexual Activity   Alcohol use: Not Currently    Comment: none in a month   Drug use: Yes    Types: Marijuana    Comment: daily   Sexual activity: Not on file    Comment: declined condoms  Other Topics Concern   Not on file  Social History Narrative   Not on file   Social Determinants of Health   Financial Resource Strain: Not on file  Food Insecurity: Not on file  Transportation Needs: Not on file  Physical Activity: Not on file  Stress: Not on file  Social Connections: Not on file   No past surgical history on file. No past surgical  history on file. Past Medical History:  Diagnosis Date   HIV (human immunodeficiency virus infection) (HCC)    BP 121/78   Pulse 62   Ht 6' (1.829 m)   Wt 167 lb (75.8 kg)   SpO2 99%   BMI 22.65 kg/m   Opioid Risk Score:   Fall Risk Score:  `1  Depression screen PHQ 2/9     11/16/2022    2:43 PM 10/26/2022    2:26 PM 08/30/2022    1:40 PM 05/13/2022    8:29 AM 01/20/2022   10:28 AM 01/12/2022   10:40 AM 07/21/2021    2:47 PM  Depression screen PHQ 2/9  Decreased Interest 0 0 3 0 2 0 0  Down, Depressed, Hopeless 0 0 1 0 0 0 0  PHQ - 2 Score 0 0 4 0 2 0 0  Altered sleeping 0  1  2  0  Tired, decreased energy 0  1  3  3   Change in appetite 0  1  0  0  Feeling bad or failure about yourself  0  0  0  0  Trouble concentrating 0  3  0  1  Moving slowly or fidgety/restless 0  3  0  0  Suicidal thoughts 0  0  0  0  PHQ-9 Score 0  13  7  4       Review of Systems  Musculoskeletal:        Left leg pain  All other systems reviewed and are negative.     Objective:   Physical Exam    PE: Constitution: Appropriate appearance for age. No apparent distress  Resp: No respiratory distress. No accessory muscle usage. on RA Cardio: Well perfused appearance. No peripheral edema. Abdomen: Nondistended. Nontender.   Psych: Appropriate mood and affect. Neuro: AAOx4. No apparent cognitive deficits   Neurologic Exam:   DTRs: Reflexes were 2+ in bilateral achilles, patella, biceps, BR and triceps. Hoffmans: negative b/l Sensory exam: revealed normal sensation in all dermatomal regions in bilateral upper extremities, bilateral lower extremities, and with reduced sensation to light touch in left foot diffusely  and into left S1 Motor exam: strength 5/5 throughout bilateral upper extremities and bilateral lower extremities Coordination: Fine motor coordination was normal.   Gait: normal   Back Exam:   Inspection: Pelvis was  even.  Lumbar lordotic curvature was   wnl .  There was  no  evidence of scoliosis.  Palpation: Palpatory exam revealed ttp at the left lumbar paraspinals . There was no evidence of spasm.   Special/provocative testing:    SLR: -   Slump test: -   Facet loading: mild + (very non-specific)  TTP at paraspinals:  Mild + on left (sensitive for facet pain...if no ttp then likely not facet pain)        Assessment & Plan:   Mason Moran is a 52 y.o. year old male  who  has a past medical history of HIV (human immunodeficiency virus infection) (HCC).   They are presenting to PM&R clinic as a new patient for treatment of left low back and lower extremity pain .  Left-sided low back pain with left-sided sciatica, unspecified chronicity  Wean off of duloxetine gradually by taking 1 capsule daily for 1 week, then stopping.  We discussed also stopping gabapentin, we will continue it at this time and if mood swings do not improve after stopping duloxetine, we may reduce or adjust this medication.  Please call clinic in 2 weeks to let me know.  Although your EMG showed generalized peripheral neuropathy, there were also some changes in your calf and description of your pain is suspicious for a pinched nerve in your low back causing most of your symptoms.  I am sending you to physical therapy as below, and getting an MRI of your low back to evaluate this.  Depending on the results of the MRI and how PT goes, I may recommend you get an injection in your back to treat this.  Follow-up with me in 3 months, but please call sooner as instructed above.  Patient is currently not a candidate for controlled substance or narcotic pain management.  Gait instability I am sending you to physical therapy to work on gait stability and pain control in your low back.  Human immunodeficiency virus (HIV) disease (HCC) Polyneuropathy associated with underlying disease (HCC)  I gave you some information today about Qutenza, which can help with peripheral nerve pain.  Talk to your  insurer about coverage for this, and if it is reasonable I would recommend it for the pain in your foot.  You would make an appointment with the office for this if it something you are interested in.

## 2023-02-10 ENCOUNTER — Other Ambulatory Visit (HOSPITAL_COMMUNITY): Payer: Self-pay

## 2023-02-10 ENCOUNTER — Telehealth: Payer: Self-pay | Admitting: Family Medicine

## 2023-02-10 NOTE — Telephone Encounter (Signed)
Call placed to patient unable to reach. VM not setup

## 2023-02-10 NOTE — Telephone Encounter (Signed)
Copied from CRM [id number]. Topic: General - Inquiry >> Feb 10, 2023  9:18 AM [name] wrote: Patient called and stated he was returning a phone call and that he does not have voicemail set up, patient was returning Cassandra's call per noted in the NT Telephone Encounter on 10.1.2024. Please call patient back @ # [phone]

## 2023-02-11 ENCOUNTER — Telehealth: Payer: Self-pay

## 2023-02-11 IMAGING — DX DG CHEST 2V
2 series · 2 of 2 positions shown · non-contrast
Comparison: Chest x-ray 06/12/2021.  Chest CT 06/13/2021.

CLINICAL DATA: Necrotic pneumonia.  Dyspnea.

EXAM:
CHEST - 2 VIEW

[dg chest 2 view (1 of 2)]
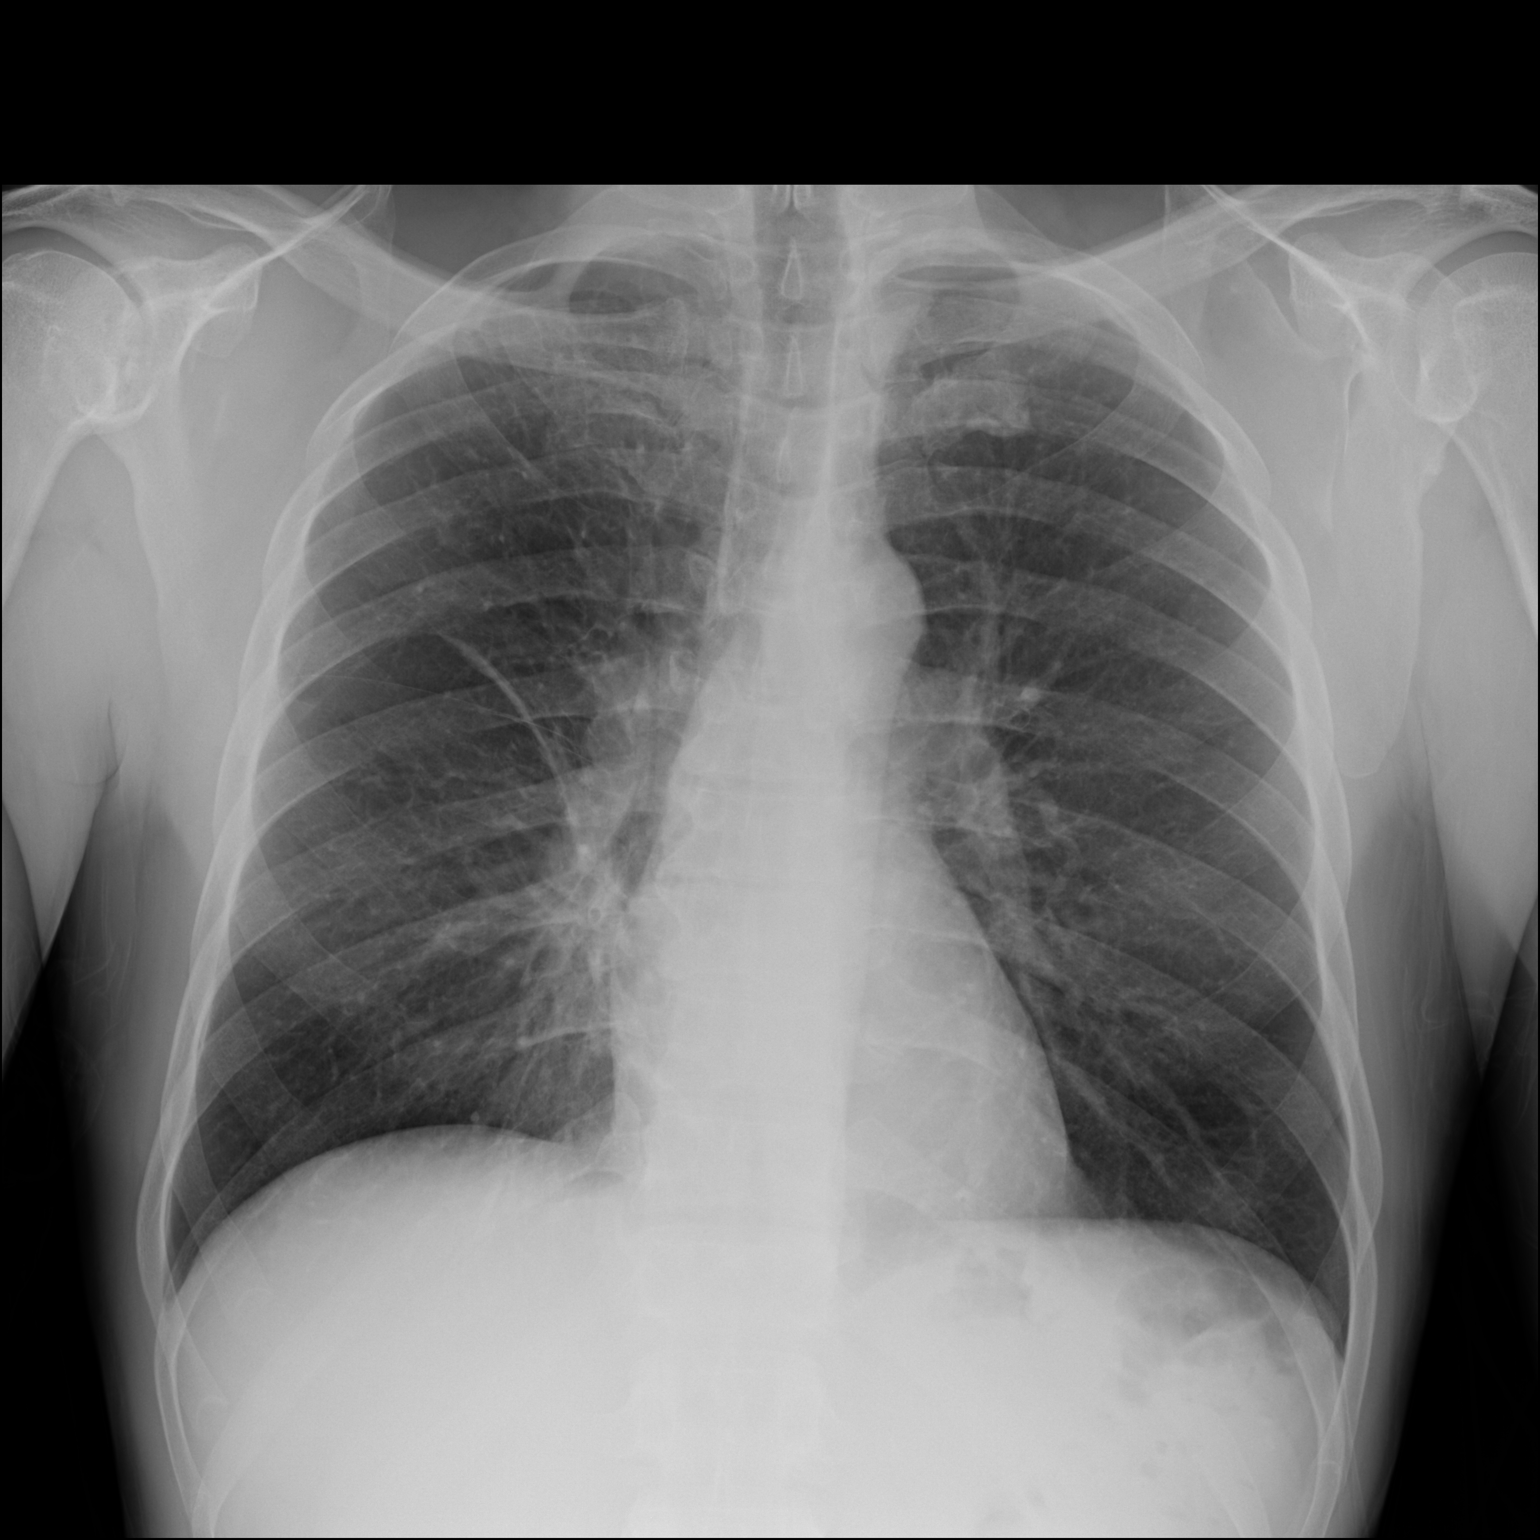

[dg chest 2 view (2 of 2)]
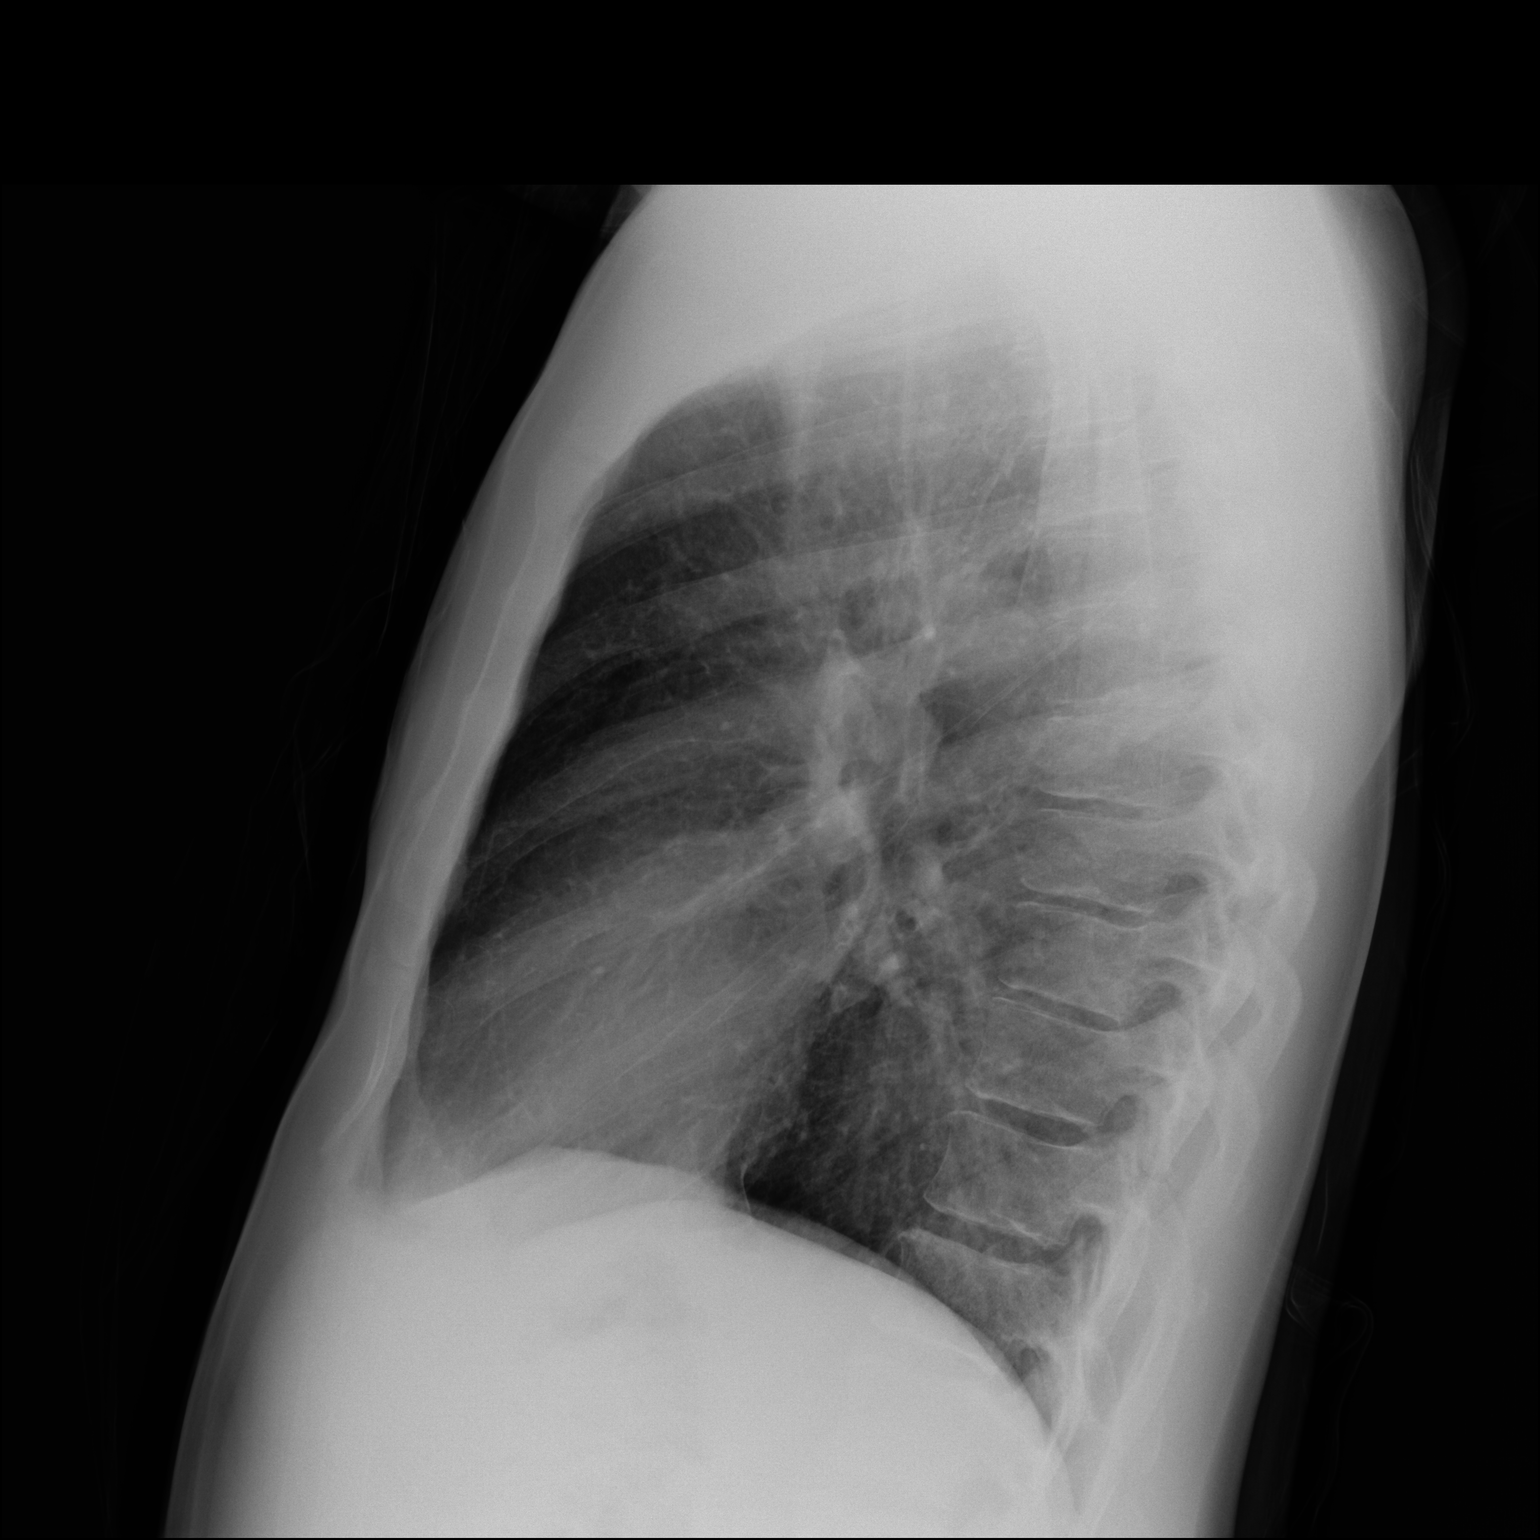

[2 of 2 positions shown; findings below may reference images not displayed]

FINDINGS: Right lower lobe consolidation is no longer seen. Patchy right
infrahilar opacities persist. There is a linear band of atelectasis
in the right mid lung. There is no pleural effusion or pneumothorax
identified. Cardiomediastinal silhouette is within normal limits.
IMPRESSION: 1. Right lower lobe consolidation has significantly decreased. Right
infrahilar airspace disease persists, indeterminate. Continued
follow-up imaging recommended to confirm complete resolution.
2. Resolution of right pleural effusion.

## 2023-02-11 NOTE — Telephone Encounter (Signed)
Received notification from patient's insurance that they will only supply a limited quantity of clotrimazole cream 1%. They will dispense 45 grams every 30 days.   Sandie Ano, RN

## 2023-02-11 NOTE — Telephone Encounter (Addendum)
Spoke with patient advised per PCP of the following: Patient has completed antibiotics and is still symptomatic. Please have patient to contact infectious disease specialist about this.  That is their specialty and they would treat him appropriately.

## 2023-02-11 NOTE — Telephone Encounter (Signed)
Spoke with patient . Please see pervious note

## 2023-02-15 DIAGNOSIS — F1210 Cannabis abuse, uncomplicated: Secondary | ICD-10-CM | POA: Diagnosis not present

## 2023-02-16 ENCOUNTER — Ambulatory Visit: Payer: Self-pay

## 2023-02-16 NOTE — Telephone Encounter (Signed)
  Chief Complaint: back pain Symptoms: R lower back pain, comes and goes, 7/10 Frequency: since yesterday Pertinent Negatives: NA Disposition: [] ED /[] Urgent Care (no appt availability in office) / [x] Appointment(In office/virtual)/ []  [place] [facility]/ [] Home Care/ [] Refused Recommended Disposition /[] [place] [facility]/ []  Follow-up with PCP Additional Notes: pt is requesting something for back pain. Said he had Rehab appt and Dr. Shearon Stalls told him Dr. Alvis Lemmings had to prescribe him something for pain. Pt states he has tried muscle relaxers but they aint helping. Pt has [facility] visit appt tomorrow at 0940, pt was unsure if that is OV or telephone, tried to confirm with Cassandra, RN since pt has acute issues that would need to be seen as well. Pt has transportation lined up for visit tomorrow so advised him to come to office, if the [facility] appt ends up being a phone visit they will advise him of that. Relayed this information to [employer], Charity fundraiser.   Reason for Disposition  [1] SEVERE back pain (e.g., excruciating, unable to do any normal activities) AND [2] not improved 2 hours after pain medicine  Answer Assessment - Initial Assessment Questions 1. ONSET: "When did the pain begin?"      Yesterday worse, several weeks  2. LOCATION: "Where does it hurt?" (upper, mid or lower back)     R sided lower back  3. SEVERITY: "How bad is the pain?"  (e.g., Scale 1-10; mild, moderate, or severe)   - MILD (1-3): Doesn't interfere with normal activities.    - MODERATE (4-7): Interferes with normal activities or awakens from sleep.    - SEVERE (8-10): Excruciating pain, unable to do any normal activities.      7/10, earlier today 9/10 4. PATTERN: "Is the pain constant?" (e.g., yes, no; constant, intermittent)      Comes and goes  5. RADIATION: "Does the pain shoot into your legs or somewhere else?"     no 8. MEDICINES: "What have you taken so far for the pain?" (e.g., nothing, acetaminophen, NSAIDS)      Aleve 10. OTHER SYMPTOMS: "Do you have any other symptoms?" (e.g., fever, abdomen pain, burning with urination, blood in urine)  Protocols used: Back Pain-A-AH

## 2023-02-17 ENCOUNTER — Ambulatory Visit: Payer: Medicare HMO | Attending: Family Medicine | Admitting: Family Medicine

## 2023-02-17 ENCOUNTER — Encounter: Payer: Self-pay | Admitting: Family Medicine

## 2023-02-17 ENCOUNTER — Other Ambulatory Visit: Payer: Self-pay

## 2023-02-17 ENCOUNTER — Ambulatory Visit (INDEPENDENT_AMBULATORY_CARE_PROVIDER_SITE_OTHER): Payer: Medicare HMO | Admitting: Internal Medicine

## 2023-02-17 ENCOUNTER — Encounter: Payer: Self-pay | Admitting: Internal Medicine

## 2023-02-17 VITALS — BP 132/81 | HR 85 | Ht 72.0 in | Wt 168.6 lb

## 2023-02-17 VITALS — BP 120/78 | HR 59 | Temp 98.1°F | Resp 16 | Wt 167.8 lb

## 2023-02-17 DIAGNOSIS — L0292 Furuncle, unspecified: Secondary | ICD-10-CM | POA: Diagnosis not present

## 2023-02-17 DIAGNOSIS — Z0000 Encounter for general adult medical examination without abnormal findings: Secondary | ICD-10-CM | POA: Diagnosis not present

## 2023-02-17 DIAGNOSIS — B20 Human immunodeficiency virus [HIV] disease: Secondary | ICD-10-CM

## 2023-02-17 DIAGNOSIS — F17210 Nicotine dependence, cigarettes, uncomplicated: Secondary | ICD-10-CM

## 2023-02-17 DIAGNOSIS — Z113 Encounter for screening for infections with a predominantly sexual mode of transmission: Secondary | ICD-10-CM

## 2023-02-17 DIAGNOSIS — Z79899 Other long term (current) drug therapy: Secondary | ICD-10-CM

## 2023-02-17 DIAGNOSIS — Z1211 Encounter for screening for malignant neoplasm of colon: Secondary | ICD-10-CM | POA: Diagnosis not present

## 2023-02-17 DIAGNOSIS — H3092 Unspecified chorioretinal inflammation, left eye: Secondary | ICD-10-CM

## 2023-02-17 DIAGNOSIS — K089 Disorder of teeth and supporting structures, unspecified: Secondary | ICD-10-CM

## 2023-02-17 MED ORDER — MUPIROCIN 2 % EX OINT: 1.0000 | TOPICAL_OINTMENT | Freq: Two times a day (BID) | CUTANEOUS | 1 refills | Status: DC

## 2023-02-17 MED ORDER — CHLORHEXIDINE GLUCONATE 4 % EX SOLN: Freq: Every day | CUTANEOUS | 1 refills | Status: DC

## 2023-02-17 MED ORDER — CLINDAMYCIN HCL 300 MG PO CAPS: 300.0000 mg | ORAL_CAPSULE | Freq: Three times a day (TID) | ORAL | 0 refills | Status: DC

## 2023-02-17 NOTE — Patient Instructions (Signed)
[facility] Five day Treatment Plan for MRSA (Staph) Decolonization  Please let us know if you have questions or concerns or do not understand the information we give you.  Prepare Chose a period when you will be uninterrupted by going away or other distractions. To ensure that your skin is in good condition, follow the Routine Skin Care principles below to reduce drying and enhance healing. Do not start while you have any active boils. Routine Skin Care principles to reduce drying and enhance healing: Avoid the use of soap when bathing or showering when performing this decolonization. DO NOT routinely use antiseptic solutions. If you need to use something, chose a soap substitute (examples -QV Wash or Cetaphil).  When drying with a towel, be gentle and pat dry your skin. Avoid rubbing the skin.  Reduce the overall frequency of bathing or showering. A short shower (3 minutes) is better than a bath in terms of its effect on the skin.  Use a simple sorbelene based-cream on your skin prior to showering and immediately after drying. (Examples: Hydroderm or other Sorbelene-based preparations). Especially protect healing or dry areas of skin in this way. Don't use a barrier cream with a vaseline base or with perfumes and additives. You are more likely to be allergic to these products, and cause further damage to your skin.  Make sure that you clean and cover any skin cuts or grazes that occur. Try to avoid picking or biting fingernails and the skin around the nails Keep your fingernails clipped short and clean to reduce problems caused by scratching.  For itchy skin, try gently massaging sorbelene-based cream into itchy areas instead of scratching it. A long-acting non-sedating antihistamine drug is the next option to try. However make sure that you are not taking medication that will interact with this drug type.                                                                          To prepare yourself for your treatment, it is recommended that you complete the following steps: Remove nose, ear and other body piercing items for several days prior to the treatment and keep them out during the treatment period  Purchase a new toothbrush, disposable razor (if used), sterident for dentures (if required) and a container of alcohol hand hygiene solution (gel or rub)  Discard old toothbrushes and razors when the treatment starts. Also discard opened deodorant rollers, skin adhesive tapes, skin creams and solutions- all of these may already be contaminated with staph  Discard pumice stone(s), sponges and disposable face cloths if used   Discard all make-up brushes, creams, and implements  Discard or hot wash all fluffy toys  Wash hair brush and comb, nail files, plastic toys and cutters in the dishwasher or purchase new ones  Remove nail varnish and artificial nails  Daily routine for 5 days     *Minimize contact with members of the community during the 5 days of treatment as much as possible* Body washes The effectiveness of the program increases if the correct procedure is used: Apply the provided antiseptic body wash (2% chlorhexidine) in the shower daily  Take care to wash hair, under the arms and   into the groin and into any folds of skin  Allow the antiseptic to remain on the skin for at least 5 minutes  Nasal ointment Disinfect your hands with alcohol gel/rub and allow to dry   Open the mupirocin 2% (Bactroban) nasal ointment.  Place small amount (size of match head) of ointment onto a clean cotton swab and massage gently around the inside of the nostril on one side, making sure not to insert it too deeply (no more than 2 cm or a little less than an inch).  Use a new cotton bud for the other nostril so that you do not contaminate the Bactroban tube with staph.   After applying the ointment, press a finger against the nose next to the nostril opening and use a circular  motion to spread the ointment within the nose  Apply the mupirocin ointment two times a day for 5 days  Disinfect your hands with alcohol rub/gel after applying the ointmentp>  Personal items (combs, razor, eyeglasses, jewelry, etc.)     Disinfect all personal items daily with an alcohol-based cleanser  House Environment and Clothes/Linens On day 2 and 5 of the treatment, clean your house, (especially the bedroom and bathroom). Clean dust off all surfaces and then vacuum clean all floor surfaces AND soft furnishings (such as your favorite chair). If your chair/couch has a vinyl or leather covering then wipe over the chair with warm soapy water and then dry with a clean towel (which should then be washed). Staph lives in skin scales from humans that contaminate the environment. This can lead to re-infection.   Disinfect the shower floor and/or bath tub daily   On days 1, 3, AND 5 of the treatment wash your clothes, underwear, pajamas and bed linen (such as towels, sheets, washcloths, and bath mats). A hot wash with laundry detergent is best (there is no need to use expensive laundry detergent or powder). Dry clothes in sun if possible. Change into clean clothes or pajamas on those days after your shower.   Do not share or exchange any personal items of clothing  Sports/Gym     Surfaces, equipment and towels, and skin-to-skin contact are all potential sources for staph re-infection Pets Dogs and other companion animals can also be colonized with the same strains of MRSA. Best to wash or replace bedding material for the animal and wash the animal at least once during the treatment period with antiseptic solution (2% chlorhexidine wash). Ensure that the skin of the animal is kept in good condition. If the pet has any chronic skin disorders, consult your vet prior to starting your treatment process. What about my partner, family, or household members? Usually when an aggressive strain of staph moves in to  a family or household, only certain members of that group get infections (boils). This is despite the fact that the strain has probably transferred itself from person to person within the group. Those without boils may also be carrying the bacterium, however they must have better resistance (immunity) or perhaps have better skin condition. Staph likes to invade through cuts, scratches and skin with dermatitis or dryness.    Follow-up after decolonization treatment  Possible approaches will vary depending on how your treatment goes. Your provider will instruct you on the follow-up best suited for you. Some options are:  Wait and see - if no further boils occur within 6 months then it is probable that the strain of staph has been eliminated from you.     Continue intermittent body washes 1-2 times per week with 2% aqueous chlorhexidine soap preparation or similar   Future antibiotic use  The best preventative approach to avoiding future problems may be to avoid use of antibiotics unless there is a strong indication. Antibiotics are often prescribed for minor infections or for respiratory infections that are mostly due to viruses. It is in your best interest to ride these infections out rather than taking antibiotics. Taking antibiotics alters your natural bacterial flora on your skin and in your gut. This may reduce your resistance against acquiring a resistant bacterial strain such as MRSA).   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   References Wiese-Posselt et al. Clin Inf Dis 2007:44:e88  CDC:  http://www.cdc.gov/ncidod/dhqp/ar_mrsa_ca.html   Bleach baths  Take a bleach baths twice a week for at least 15 minutes with any kind of soap for 3  weeks. Bleach baths can be a good treatment for people who do not have broken skin or eczema. They can help prevent MRSA from coming back. Use lotion after the bath, because a bleach bath can dry out the skin.    How much bleach to put in: an average full bathtub (adult level) holds about 40 gallons of water, add 1/4 cup bleach for each 1/4 fill of the bathtub.    

## 2023-02-17 NOTE — Progress Notes (Signed)
Subjective:    Mason Moran is a 52 y.o. male who presents for a Welcome to Medicare exam.    Has smoked x36 years about 2ppd and has now cut back to 1 pack over 2 days. Not up-to-date on colonoscopy.     Objective:    Today's Vitals   02/17/23 0951  BP: 132/81  Pulse: 85  SpO2: 99%  Weight: 168 lb 9.6 oz (76.5 kg)  Height: 6' (1.829 m)  PainSc: 8    Body mass index is 22.87 kg/m.  Medications Outpatient Encounter Medications as of 02/17/2023  Medication Sig   baclofen (LIORESAL) 10 MG tablet Take 1 tablet (10 mg total) by mouth 2 (two) times daily as needed for muscle spasms.   bictegravir-emtricitabine-tenofovir AF (BIKTARVY) 50-200-25 MG TABS tablet Take 1 tablet by mouth daily.   gabapentin (NEURONTIN) 300 MG capsule Take 1 in the morning and 2 at bedtime   clotrimazole (LOTRIMIN) 1 % cream Apply 1 Application topically 2 (two) times daily. (Patient not taking: Reported on 02/17/2023)   doxycycline (VIBRA-TABS) 100 MG tablet Take 1 tablet (100 mg total) by mouth 2 (two) times daily. (Patient not taking: Reported on 02/17/2023)   DULoxetine (CYMBALTA) 30 MG capsule Take 1 capsule (30 mg total) by mouth daily. (Patient not taking: Reported on 02/17/2023)   Misc. [employer] Diagnosis- lower back pain (Patient not taking: Reported on 02/17/2023)   mupirocin ointment (BACTROBAN) 2 % Apply 1 Application topically 2 (two) times daily. X 1 week Prn boils (Patient not taking: Reported on 02/17/2023)   valACYclovir (VALTREX) 500 MG tablet TAKE 1 TABLET BY MOUTH TWICE DAILY (Patient not taking: Reported on 02/17/2023)   [DISCONTINUED] clindamycin (CLEOCIN) 300 MG capsule Take 1 capsule (300 mg total) by mouth 3 (three) times daily. (Patient not taking: Reported on 02/17/2023)   No facility-administered encounter medications on file as of 02/17/2023.     History: Past Medical History:  Diagnosis Date   HIV (human immunodeficiency virus infection) (HCC)    History  reviewed. No pertinent surgical history.  History reviewed. No pertinent family history. Social History   Occupational History   Not on file  Tobacco Use   Smoking status: Every Day    Current packs/day: 0.50    Types: Cigarettes   Smokeless tobacco: Never  Vaping Use   Vaping status: Never Used  Substance and Sexual Activity   Alcohol use: Not Currently    Comment: none in a month   Drug use: Yes    Types: Marijuana    Comment: daily   Sexual activity: Not on file    Comment: declined condoms    Tobacco Counseling Ready to quit: No Counseling given: Yes   Immunizations and Health Maintenance Immunization History  Administered Date(s) Administered   Tdap 10/05/2015   Health Maintenance Due  Topic Date Due   Zoster Vaccines- Shingrix (1 of 2) Never done   Colonoscopy  Never done   Lung Cancer Screening  06/13/2022    Activities of Daily Living    02/17/2023    9:53 AM  In your present state of health, do you have any difficulty performing the following activities:  Hearing? 0  Vision? 1  Difficulty concentrating or making decisions? 0  Walking or climbing stairs? 1  Dressing or bathing? 1  Doing errands, shopping? 0  Preparing Food and eating ? N  Using the Toilet? N  In the past six months, have you accidently leaked urine? N  Do  you have problems with loss of bowel control? N  Managing your Medications? N  Managing your Finances? N  Housekeeping or managing your Housekeeping? N    Physical Exam   Physical Exam (optional), or other factors deemed appropriate based on the beneficiary's medical and social history and current clinical standards.   Advanced Directives: Does Patient Have a Medical Advance Directive?: No        Assessment:    This is a routine wellness  examination for this patient .   Vision/Hearing screen No results found.   Goals      Exercise 150 min/wk Moderate Activity     Quit Smoking         Depression Screen     02/17/2023   11:10 AM 02/17/2023   10:04 AM 11/16/2022    2:43 PM 10/26/2022    2:26 PM  PHQ 2/9 Scores  PHQ - 2 Score 0  0 0  PHQ- 9 Score   0   Exception Documentation  Patient refusal       Fall Risk    02/17/2023   11:10 AM  Fall Risk   Falls in the past year? 1  Number falls in past yr: 0  Injury with Fall? 1  Risk for fall due to : History of fall(s);Impaired balance/gait  Follow up Falls evaluation completed    Cognitive Function        02/17/2023   10:00 AM  6CIT Screen  What Year? 0 points  What month? 0 points  What time? 0 points  Count back from 20 0 points  Months in reverse 0 points  Repeat phrase 0 points  Total Score 0 points    Patient Care Team: Hoy Register, MD as PCP - General (Family Medicine)     Plan:   1. Encounter for Medicare annual wellness exam Counseled on 150 minutes of exercise per week, healthy eating (including decreased daily intake of saturated fats, cholesterol, added sugars, sodium), routine healthcare maintenance. Declined Flu shot  2. Screening for colon cancer - Ambulatory referral to Gastroenterology  3. Smoking greater than 20 pack years Spent 3 minutes counseling on smoking cessation but he is not interested in quitting - CT CHEST LUNG CANCER SCREENING LOW DOSE WO CONTRAST; Future   I have personally reviewed and noted the following in the patient's chart:   Medical and social history Use of alcohol, tobacco or illicit drugs  Current medications and supplements Functional ability and status Nutritional status Physical activity Advanced directives List of other physicians Hospitalizations, surgeries, and ER visits in previous 12 months Vitals Screenings to include cognitive, depression, and falls Referrals and appointments  In addition, I have reviewed and discussed with patient certain preventive protocols, quality metrics, and best practice recommendations. A written personalized care plan for  preventive services as well as general preventive health recommendations were provided to patient.     Hoy Register, MD 02/17/2023

## 2023-02-17 NOTE — Patient Instructions (Signed)
  Mason Moran , Thank you for taking time to come for your Medicare Wellness Visit. I appreciate your ongoing commitment to your health goals. Please review the following plan we discussed and let me know if I can assist you in the future.   These are the goals we discussed:  Goals      Exercise 150 min/wk Moderate Activity     Quit Smoking        This is a list of the screening recommended for you and due dates:  Health Maintenance  Topic Date Due   COVID-19 Vaccine (1) Never done   Zoster (Shingles) Vaccine (1 of 2) Never done   Colon Cancer Screening  Never done   Flu Shot  Never done   Medicare Annual Wellness Visit  02/17/2024   DTaP/Tdap/Td vaccine (2 - Td or Tdap) 10/04/2025   Hepatitis C Screening  Completed   HIV Screening  Completed   HPV Vaccine  Aged Out

## 2023-02-18 ENCOUNTER — Encounter: Payer: Self-pay | Admitting: Family Medicine

## 2023-02-18 ENCOUNTER — Encounter: Payer: Self-pay | Admitting: Internal Medicine

## 2023-02-18 ENCOUNTER — Telehealth: Payer: Self-pay

## 2023-02-18 LAB — T-HELPER CELL (CD4) - (RCID CLINIC ONLY)
CD4 % Helper T Cell: 18 % — ABNORMAL LOW (ref 33–65)
CD4 T Cell Abs: 277 /uL — ABNORMAL LOW (ref 400–1790)

## 2023-02-18 NOTE — Assessment & Plan Note (Signed)
Will re-refer him back to the retina specialist

## 2023-02-18 NOTE — Assessment & Plan Note (Addendum)
Discussed hibiclens washes and mupirocin Instructions provided Has eczema so would avoid bleach baths

## 2023-02-18 NOTE — Telephone Encounter (Signed)
Received notification from [employer] that mupirocin is approved for a temporary supply. They will provide multiple fills to provide up to a maximum of 30 days' supply.   Sandie Ano, RN

## 2023-02-18 NOTE — Assessment & Plan Note (Signed)
Will re-refer him back to the dental clinic

## 2023-02-18 NOTE — Assessment & Plan Note (Signed)
Will screen 

## 2023-02-18 NOTE — Assessment & Plan Note (Signed)
Doing well and will check his labs today  I have personally spent 45 minutes involved in face-to-face and non-face-to-face activities for this patient on the day of the visit. Professional time spent includes the following activities: Preparing to see the patient (review of tests), Obtaining and/or reviewing separately obtained history (admission/discharge record), Performing a medically appropriate examination and/or evaluation , Ordering medications/tests/procedures, referring and communicating with other health care professionals, Documenting clinical information in the EMR, Independently interpreting results (not separately reported), Communicating results to the patient/family/caregiver, Counseling and educating the patient/family/caregiver and Care coordination (not separately reported).

## 2023-02-18 NOTE — Progress Notes (Signed)
Subjective:    Patient ID: Mason Moran, male    DOB: Apr 02, 1971, 52 y.o.   MRN: [id number]  HPI Mason Moran is here for follow up of boils. He has a history of HIV and followed here on Biktarvy.  No missed doses.  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  Currently in his right axillary area.  They do not drain.  He gets relief from clindamycin.  Vision remains poor.  Has not been to the dentist.    Review of Systems  Constitutional:  Negative for fatigue.  Gastrointestinal:  Negative for diarrhea and nausea.  Skin:  Negative for rash.       Objective:   Physical Exam Eyes:     General: No scleral icterus. Pulmonary:     Effort: Pulmonary effort is normal.  Neurological:     Mental Status: He is alert.   SH: + tobacco        Assessment & Plan:

## 2023-02-19 LAB — COMPLETE METABOLIC PANEL WITH GFR
AG Ratio: 1.2 (calc) (ref 1.0–2.5)
ALT: 12 U/L (ref 9–46)
AST: 16 U/L (ref 10–35)
Albumin: 4.3 g/dL (ref 3.6–5.1)
Alkaline phosphatase (APISO): 75 U/L (ref 35–144)
BUN: 8 mg/dL (ref 7–25)
CO2: 28 mmol/L (ref 20–32)
Calcium: 9.8 mg/dL (ref 8.6–10.3)
Chloride: 103 mmol/L (ref 98–110)
Creat: 0.92 mg/dL (ref 0.70–1.30)
Globulin: 3.5 g/dL (ref 1.9–3.7)
Glucose, Bld: 89 mg/dL (ref 65–99)
Potassium: 4.2 mmol/L (ref 3.5–5.3)
Sodium: 138 mmol/L (ref 135–146)
Total Bilirubin: 0.6 mg/dL (ref 0.2–1.2)
Total Protein: 7.8 g/dL (ref 6.1–8.1)
eGFR: 99 mL/min/{1.73_m2} (ref >=60)

## 2023-02-19 LAB — RPR: RPR Ser Ql: NONREACTIVE

## 2023-02-19 LAB — CBC WITH DIFFERENTIAL/PLATELET
Absolute Monocytes: 415 {cells}/uL (ref 200–950)
Basophils Absolute: 62 {cells}/uL (ref 0–200)
Basophils Relative: 1 %
Eosinophils Absolute: 316 {cells}/uL (ref 15–500)
Eosinophils Relative: 5.1 %
HCT: 47.8 % (ref 38.5–50.0)
Hemoglobin: 15 g/dL (ref 13.2–17.1)
Lymphs Abs: 1773 {cells}/uL (ref 850–3900)
MCH: 29.6 pg (ref 27.0–33.0)
MCHC: 31.4 g/dL — ABNORMAL LOW (ref 32.0–36.0)
MCV: 94.3 fL (ref 80.0–100.0)
MPV: 10.9 fL (ref 7.5–12.5)
Monocytes Relative: 6.7 %
Neutro Abs: 3633 {cells}/uL (ref 1500–7800)
Neutrophils Relative %: 58.6 %
Platelets: 278 10*3/uL (ref 140–400)
RBC: 5.07 10*6/uL (ref 4.20–5.80)
RDW: 12.4 % (ref 11.0–15.0)
Total Lymphocyte: 28.6 %
WBC: 6.2 10*3/uL (ref 3.8–10.8)

## 2023-02-19 LAB — HIV-1 RNA QUANT-NO REFLEX-BLD
HIV 1 RNA Quant: 36 {copies}/mL — ABNORMAL HIGH
HIV-1 RNA Quant, Log: 1.56 {Log} — ABNORMAL HIGH

## 2023-02-19 LAB — LIPID PANEL
Cholesterol: 130 mg/dL (ref <200)
HDL: 67 mg/dL (ref >=40)
LDL Cholesterol (Calc): 44 mg/dL
Non-HDL Cholesterol (Calc): 63 mg/dL (ref <130)
Total CHOL/HDL Ratio: 1.9 (calc) (ref <5.0)
Triglycerides: 111 mg/dL (ref <150)

## 2023-02-22 NOTE — Therapy (Signed)
OUTPATIENT PHYSICAL THERAPY THORACOLUMBAR AND BALANCE EVALUATION   Patient Name: Mason Moran MRN: [id number] DOB:05-06-1971, 52 y.o., male Today's Date: 02/23/2023  END OF SESSION:  PT End of Session - 02/23/23 1105     Visit Number 1    Date for PT Re-Evaluation 04/06/23    Authorization Type [employer] MCR    Progress Note Due on Visit 10    PT Start Time 1105    PT Stop Time 1155    PT Time Calculation (min) 50 min    Activity Tolerance Patient tolerated treatment well    Behavior During Therapy WFL for tasks assessed/performed             Past Medical History:  Diagnosis Date   HIV (human immunodeficiency virus infection) (HCC)    History reviewed. No pertinent surgical history. 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 06/12/2021   Anemia 06/12/2021   Elevated platelet count 06/12/2021   Transaminitis 06/12/2021   Vision loss     PCP: Hoy Register, MD   REFERRING PROVIDER: Angelina Sheriff, DO   REFERRING DIAG:  [phone] (ICD-10-CM) - Left-sided low back pain with left-sided sciatica, unspecified chronicity  R26.81 (ICD-10-CM) - Gait instability    Rationale for Evaluation and Treatment: Rehabilitation  THERAPY DIAG:  Radiculopathy, lumbar region - Plan: PT plan of care cert/re-cert  Cramp and spasm - Plan: PT plan of care cert/re-cert  Unsteadiness on feet - Plan: PT plan of care  cert/re-cert  ONSET DATE: 2019 or 2020  SUBJECTIVE:                                                                                                                                                                                           SUBJECTIVE STATEMENT: My whole back is tight. The cold weather makes it worse. Sitting is bad. But it can hit me when I'm walking. He had a fall 4 months ago, but always falls backwards. Sometimes his L leg is "loose". My leg gets numb from buttock down thigh to foot. Pain worse after sitting in recliner.  PERTINENT HISTORY:  HIV, Blind left eye  blind, losing R eye vision   PAIN:  Are you having pain? Yes: NPRS scale: 8/10 Pain location: usually above left buttock, but moving to the R Pain description: tightness Aggravating factors: unsure, sleeping on stomach Relieving factors: Sleeping on right side  PRECAUTIONS: Other: HIV+, vision impaired  RED FLAGS: None   WEIGHT BEARING RESTRICTIONS: No  FALLS:  Has patient fallen in last 6 months? Yes. Number of falls 1 got up out of bed and just fell  LIVING ENVIRONMENT: Lives with: lives with their family Lives in: House/apartment Stairs: Yes: Internal: 13 steps; on right going up Has following equipment at home: Single point cane  OCCUPATION: on assistance due to eyes  PLOF: Independent with household mobility with device and sister cooks for him, stays with his sister.  PATIENT GOALS: I want to wear my shoes  NEXT MD VISIT: none scheduled  OBJECTIVE:  Note: Objective measures were completed at Evaluation unless otherwise noted.  DIAGNOSTIC FINDINGS:  LUMBAR XR: Mild/early degenerative changes. No acute osseous abnormalities.   PATIENT SURVEYS:  Modified Oswestry TBD  FOTO TBD  COGNITION: Overall cognitive status: Within functional limits for tasks assessed     SENSATION: Not tested patient reports not sensation deficits today  MUSCLE LENGTH: Marked tightness of B erector spinae  in throacic and lumbar spines  POSTURE: rounded shoulders, decreased lumbar lordosis, and flexed trunk   PALPATION: Pain with UPA mobs to L4/5, reports relief with Pa mobs above L4. Very stiff L4/5, L5/S1; marked tightenss in bil lumbar and thoracic muscles.  LUMBAR ROM: pain and tightness with all motions, ext worse than flex  AROM eval  Flexion To shins  Extension WNL  Right lateral flexion 25%  Left lateral flexion 50%  Right rotation 75%  Left rotation 50%   (Blank rows = not tested)  LOWER EXTREMITY ROM:   WNL for tasks assessed  Active  Right eval Left eval  Hip flexion    Hip extension    Hip abduction    Hip adduction    Hip internal rotation    Hip external rotation    Knee flexion    Knee extension    Ankle dorsiflexion    Ankle plantarflexion    Ankle inversion    Ankle eversion     (Blank rows = not tested)  LOWER EXTREMITY MMT:    MMT Right eval Left eval  Hip flexion 5 5  Hip extension 5 5  Hip abduction 5 5  Hip adduction    Hip internal rotation    Hip external rotation    Knee flexion 5 5  Knee extension 5 5  Ankle dorsiflexion 4+ 4+  Ankle plantarflexion    Ankle inversion    Ankle eversion 5 4   (Blank rows = not tested)   FUNCTIONAL TESTS:  5 times sit to stand: 28.46 with light UE assist Dynamic Gait Index: TBD  GAIT: Distance walked: 20 Assistive device utilized: Single point cane Level of assistance: Modified independence Comments: antalgic  TODAY'S TREATMENT:  DATE:   02/23/23 See pt ed and HEP  MODALITIES: Estim IFC 80-150 MHz x 10 min to lumbar and thoracic paraspinals in prone lying with MHP  PATIENT EDUCATION:  Education details: PT eval findings, anticipated POC, posture and body mechanics for typical daily postioning, mobility and household tasks, role of DN, and recommended he not stay in  recliner for long periods and try to use lumbar support; also lie prone throughout the day if symptoms decrease Person educated: Patient Education method: Medical illustrator Education comprehension: verbalized understanding  HOME EXERCISE PROGRAM: TBD  ASSESSMENT:  CLINICAL IMPRESSION: Patient is a 52 y.o. male who was seen today for physical therapy evaluation and treatment for B low back pain, L lumbar radiculopathy and gait instability. He has had one fall in the past six months, has B neuropathy and is vision impaired. He presents today with marked spasms in erector spinae muscles limiting lumbar ROM. His pain affects his gait, sit to stand transfers and general mobility. He has marked stiffness with PA mobs and pain with left L4/5 mobs. He reported relief with UPA mobs above L L4. Balance was not assessed fully today due to his high pain level, and will be further assessed next visit. He responded very well to estim and MHP at end of session and had palpable decrease in muscle tension. He will benefit from skilled PT to address these deficits.    OBJECTIVE IMPAIRMENTS: Abnormal gait, decreased activity tolerance, decreased ROM, decreased strength, hypomobility, increased muscle spasms, impaired flexibility, postural dysfunction, and pain.   ACTIVITY LIMITATIONS: lifting, bending, sitting, standing, stairs, transfers, and locomotion level  PARTICIPATION LIMITATIONS: shopping, community activity, and church  PERSONAL FACTORS: Time since onset of injury/illness/exacerbation and 1-2 comorbidities: neuropathy, impaired vision  are also affecting patient's functional outcome.   REHAB POTENTIAL: Good  CLINICAL DECISION MAKING: Evolving/moderate complexity  EVALUATION COMPLEXITY: Low   GOALS: Goals reviewed with patient? Yes  SHORT TERM GOALS: Target date: 03/16/2023   Patient will be independent with initial HEP.  Baseline:  Goal status: INITIAL  2.  Patient will report  centralization of radicular symptoms.  Baseline:  Goal status: INITIAL   LONG TERM GOALS: Target date: 04/06/2023   Patient will be independent with advanced/ongoing HEP to improve outcomes and carryover.  Baseline:  Goal status: INITIAL  2.  Patient will report 75% improvement in low back pain to improve QOL.  Baseline:  Goal status: INITIAL  3.  Patient will demonstrate functional pain free lumbar ROM to perform ADLs.   Baseline:  Goal status: INITIAL  4.  Patient will report predicted level on lumbar FOTO to demonstrate improved functional ability.  Baseline: TBD Goal status: INITIAL   7.   Patient to demonstrate ability to achieve and maintain good spinal alignment/posturing and body mechanics needed for daily activities. Baseline:  Goal status: INITIAL  8. Patient will score at least 19/24 on DGI to decrease risk of falls Baseline: TBD Goal status: INITIAL'  PLAN:  PT FREQUENCY: 2x/week  PT DURATION: 6 weeks  PLANNED INTERVENTIONS: 97110-Therapeutic exercises, 97530- Therapeutic activity, 97112- Neuromuscular re-education, 97535- Self Care, 19147- Manual therapy, [id number]- Gait training, 97014- Electrical stimulation (unattended), [phone]- Traction (mechanical), [id number]- Ionotophoresis 4mg /ml Dexamethasone, Patient/Family education, Balance training, Stair training, Taping, Dry Needling, Spinal mobilization, Cryotherapy, and Moist heat.  PLAN FOR NEXT SESSION: Complete FOTO and DGI. Assess response to estim and provide TENS info. Initiate HEP (try extension biased if tolerated), lumbar mobility, paraspinal flexibility. MT to erector spinae. Possible DN (  precaution HIV+).    Solon Palm, PT  02/23/2023, 2:16 PM

## 2023-02-23 ENCOUNTER — Other Ambulatory Visit: Payer: Self-pay

## 2023-02-23 ENCOUNTER — Encounter: Payer: Self-pay | Admitting: Physical Therapy

## 2023-02-23 ENCOUNTER — Ambulatory Visit: Payer: Medicare HMO | Attending: Physical Medicine and Rehabilitation | Admitting: Physical Therapy

## 2023-02-23 DIAGNOSIS — R2681 Unsteadiness on feet: Secondary | ICD-10-CM | POA: Insufficient documentation

## 2023-02-23 DIAGNOSIS — M5416 Radiculopathy, lumbar region: Secondary | ICD-10-CM | POA: Diagnosis not present

## 2023-02-23 DIAGNOSIS — R252 Cramp and spasm: Secondary | ICD-10-CM | POA: Insufficient documentation

## 2023-02-23 DIAGNOSIS — M5442 Lumbago with sciatica, left side: Secondary | ICD-10-CM | POA: Diagnosis not present

## 2023-02-24 NOTE — Therapy (Signed)
OUTPATIENT PHYSICAL THERAPY TREATMENT   Patient Name: Mason Moran MRN: [id number] DOB:[birth year], 52 y.o., male Today's Date: 02/25/2023  END OF SESSION:  PT End of Session - 02/25/23 1016     Visit Number 2    Date for PT Re-Evaluation 04/06/23    Authorization Type [employer] MCR    Progress Note Due on Visit 10    PT Start Time 1016    PT Stop Time 1100    PT Time Calculation (min) 44 min    Activity Tolerance Patient tolerated treatment well    Behavior During Therapy WFL for tasks assessed/performed              Past Medical History:  Diagnosis Date   HIV (human immunodeficiency virus infection) (HCC)    History reviewed. No pertinent surgical history. 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 06/12/2021   Anemia 06/12/2021   Elevated platelet count 06/12/2021   Transaminitis 06/12/2021   Vision loss     PCP: Hoy Register, MD  REFERRING PROVIDER: Angelina Sheriff, DO  REFERRING DIAG:  [phone] (ICD-10-CM) - Left-sided low back pain with left-sided sciatica, unspecified chronicity  R26.81 (ICD-10-CM) - Gait instability   RATIONALE FOR EVALUATION AND TREATMENT: Rehabilitation  THERAPY DIAG:  Radiculopathy, lumbar region  Cramp and spasm  Unsteadiness on feet  ONSET DATE: 2019 or 2020  NEXT MD VISIT: none scheduled   SUBJECTIVE:                                                                                                                                                                                            SUBJECTIVE STATEMENT: Pt denies LBP today but states his foot is hurting from where the dog jumped on it this morning - wearing a shoe is uncomfortable.  EVAL: My whole back is tight. The cold weather makes it worse. Sitting is bad. But it can hit me when I'm walking. He had a fall 4 months ago, but always falls backwards. Sometimes his L leg is "loose". My leg gets numb from buttock down thigh to foot. Pain worse after sitting in recliner.  PERTINENT HISTORY:  HIV, Blind left eye blind, losing R eye vision   PAIN:  Are you having pain? Yes: NPRS scale: 8/10 Pain location: usually above left buttock, but moving to the R Pain description: tightness Aggravating factors: unsure, sleeping on stomach Relieving factors: Sleeping on right side  PRECAUTIONS: Other: HIV+, vision impaired  RED FLAGS: None   WEIGHT BEARING RESTRICTIONS: No  FALLS:  Has patient fallen in last 6 months? Yes. Number of falls 1 got up out of bed and just fell  LIVING ENVIRONMENT: Lives with: lives with their family Lives in: House/apartment Stairs: Yes: Internal: 13 steps; on right going up Has following equipment at home: Single point cane  OCCUPATION: on assistance due to eyes  PLOF: Independent with household mobility with device and sister cooks for him, stays with his sister.  PATIENT GOALS: I want to wear my shoes   OBJECTIVE:  Note: Objective measures were completed at Evaluation unless otherwise noted.  DIAGNOSTIC FINDINGS:  LUMBAR XR: Mild/early degenerative changes. No acute osseous abnormalities.   PATIENT SURVEYS:  Modified Oswestry TBD  FOTO Lumbar FOTO = 34, predicted = 47  COGNITION:  Overall cognitive status: Within functional limits for tasks assessed     SENSATION: Not tested patient reports not sensation deficits today  MUSCLE LENGTH: Marked tightness of B  erector spinae in throacic and lumbar spines  POSTURE: rounded shoulders, decreased lumbar lordosis, and flexed trunk   PALPATION: Pain with UPA mobs to L4/5, reports relief with Pa mobs above L4. Very stiff L4/5, L5/S1; marked tightenss in bil lumbar and thoracic muscles.  LUMBAR ROM: pain and tightness with all motions, ext worse than flex  AROM eval  Flexion To shins  Extension WNL  Right lateral flexion 25%  Left lateral flexion 50%  Right rotation 75%  Left rotation 50%   (Blank rows = not tested)  LOWER EXTREMITY ROM:   WNL for tasks assessed  Active  Right eval Left eval  Hip flexion    Hip extension    Hip abduction    Hip adduction    Hip internal rotation    Hip external rotation    Knee flexion    Knee extension    Ankle dorsiflexion    Ankle plantarflexion    Ankle inversion    Ankle eversion     (Blank rows = not tested)  LOWER EXTREMITY MMT:    MMT Right eval Left eval  Hip flexion 5 5  Hip extension 5 5  Hip abduction 5 5  Hip adduction    Hip internal rotation    Hip external rotation    Knee flexion 5 5  Knee extension 5 5  Ankle dorsiflexion 4+ 4+  Ankle plantarflexion    Ankle inversion    Ankle eversion 5 4   (Blank rows = not tested)   FUNCTIONAL TESTS:  5 times sit to stand: 28.46 with light UE assist Dynamic Gait Index: TBD  GAIT: Distance walked: 20 Assistive device utilized: Single point cane Level of assistance: Modified independence Comments: antalgic  TODAY'S TREATMENT:  DATE:    02/25/23 THERAPEUTIC ACTIVITIES: Lumbar FOTO = 34, predicted = 47 DGI: deferred today due to increased L foot pain  THERAPEUTIC EXERCISE: to improve flexibility, strength and mobility.  Demonstration, verbal and tactile cues throughout for technique.  NuStep - L4 x 6 min (UE/LE) Supine SKTC Stretch x 30" bil Hooklying  LTR 10 x 10" Hooklying KTOS piriformis stretch x 30" bil Hooklying PPT 10 x 5" Hooklying TrA + hip ADD ball squeeze 10 x 5" Bridge 10 x 5"  SELF CARE: Provided education on home TENS unit options - TENS 7000 2nd edition vs Auvon unit   02/23/23 See pt ed and HEP  MODALITIES: Estim IFC 80-150 MHz x 10 min to lumbar and thoracic paraspinals in prone lying with MHP   PATIENT EDUCATION:  Education details: PT eval findings, anticipated POC, posture and body mechanics for typical daily postioning, mobility and household tasks, role of DN, and recommended he not stay in recliner for long periods and try to use lumbar support; also lie prone throughout the day if symptoms decrease Person educated: Patient Education method: Explanation and Demonstration Education comprehension: verbalized understanding  HOME EXERCISE PROGRAM: Access Code: [email or web address] URL: [email or web address] Date: 02/25/2023 Prepared by: Glenetta Hew  Exercises - Supine Single Knee to Chest Stretch  - 2 x daily - 7 x weekly - 3 reps - 30 sec hold - Supine Lower Trunk Rotation  - 2 x daily - 7 x weekly - 2 sets - 10 reps - 10 sec hold - Supine Piriformis Stretch with Foot on Ground  - 2 x daily - 7 x weekly - 3 reps - 30 sec hold - Supine Posterior Pelvic Tilt  - 2 x daily - 7 x weekly - 2 sets - 10 reps - 5 sec hold - Supine Hip Adduction Isometric with Ball  - 2 x daily - 7 x weekly - 2 sets - 10 reps - 5 sec hold - Supine Bridge  - 2 x daily - 7 x weekly - 2 sets - 10 reps - 5 sec hold   ASSESSMENT:  CLINICAL IMPRESSION: Dray denies LBP today (benefit but reports L foot pain as a result of the dog jumping on his foot - deferred DGI as a result. Lumbar FOTO completed with PT reading the questions due to low vision. Remainder of therapy session focusing supine based lumbar mobility, flexibility and stabilization/strengthening with initial HEP provided. He notes benefit from the estim, last visit therefore  provided information on home TENS unit options - pt informed that if he chooses to purchase a unit, he is welcome to bring it with him to a therapy session for assistance with setting up the treatment parameters. Muaad will benefit from continued skilled PT to address above deficits to improve mobility and activity tolerance with decreased pain interference.   OBJECTIVE IMPAIRMENTS: Abnormal gait, decreased activity tolerance, decreased ROM, decreased strength, hypomobility, increased muscle spasms, impaired flexibility, postural dysfunction, and pain.   ACTIVITY LIMITATIONS: lifting, bending, sitting, standing, stairs, transfers, and locomotion level  PARTICIPATION LIMITATIONS: shopping, community activity, and church  PERSONAL FACTORS: Time since onset of injury/illness/exacerbation and 1-2 comorbidities: neuropathy, impaired vision  are also affecting patient's functional outcome.   REHAB POTENTIAL: Good  CLINICAL DECISION MAKING: Evolving/moderate complexity  EVALUATION COMPLEXITY: Low   GOALS: Goals reviewed with patient? Yes  SHORT TERM GOALS: Target date: 03/16/2023   Patient will be independent with initial HEP.  Baseline:  Goal status: IN PROGRESS  2.  Patient  will report centralization of radicular symptoms.  Baseline:  Goal status: IN PROGRESS  LONG TERM GOALS: Target date: 04/06/2023   Patient will be independent with advanced/ongoing HEP to improve outcomes and carryover.  Baseline:  Goal status: IN PROGRESS  2.  Patient will report 75% improvement in low back pain to improve QOL.  Baseline:  Goal status: IN PROGRESS  3.  Patient will demonstrate functional pain free lumbar ROM to perform ADLs.   Baseline:  Goal status: IN PROGRESS  4.  Patient will report predicted level (47) on lumbar FOTO to demonstrate improved functional ability.  Baseline: 34 Goal status: IN PROGRESS   7.   Patient to demonstrate ability to achieve and maintain good spinal  alignment/posturing and body mechanics needed for daily activities. Baseline:  Goal status: IN PROGRESS  8. Patient will score at least 19/24 on DGI to decrease risk of falls Baseline: TBD Goal status: IN PROGRESS   PLAN:  PT FREQUENCY: 2x/week  PT DURATION: 6 weeks  PLANNED INTERVENTIONS: 97110-Therapeutic exercises, 97530- Therapeutic activity, 97112- Neuromuscular re-education, 97535- Self Care, 27253- Manual therapy, [id number]- Gait training, 97014- Electrical stimulation (unattended), [phone]- Traction (mechanical), [id number]- Ionotophoresis 4mg /ml Dexamethasone, Patient/Family education, Balance training, Stair training, Taping, Dry Needling, Spinal mobilization, Cryotherapy, and Moist heat.  PLAN FOR NEXT SESSION: Complete DGI. Assist with TENS unit parameter setup if pt brings unit. Progress lumbar mobility, paraspinal flexibility (try extension biased exercises if tolerated). MT to erector spinae. Possible DN (precaution HIV+).    Marry Guan, PT  02/25/2023, 12:27 PM

## 2023-02-25 ENCOUNTER — Encounter: Payer: Self-pay | Admitting: Physical Therapy

## 2023-02-25 ENCOUNTER — Ambulatory Visit: Payer: Medicare HMO | Admitting: Physical Therapy

## 2023-02-25 DIAGNOSIS — R252 Cramp and spasm: Secondary | ICD-10-CM

## 2023-02-25 DIAGNOSIS — R2681 Unsteadiness on feet: Secondary | ICD-10-CM | POA: Diagnosis not present

## 2023-02-25 DIAGNOSIS — M5416 Radiculopathy, lumbar region: Secondary | ICD-10-CM | POA: Diagnosis not present

## 2023-02-25 DIAGNOSIS — M5442 Lumbago with sciatica, left side: Secondary | ICD-10-CM | POA: Diagnosis not present

## 2023-03-02 ENCOUNTER — Ambulatory Visit: Payer: Medicare HMO

## 2023-03-02 DIAGNOSIS — M5416 Radiculopathy, lumbar region: Secondary | ICD-10-CM | POA: Diagnosis not present

## 2023-03-02 DIAGNOSIS — R2681 Unsteadiness on feet: Secondary | ICD-10-CM | POA: Diagnosis not present

## 2023-03-02 DIAGNOSIS — R252 Cramp and spasm: Secondary | ICD-10-CM

## 2023-03-02 DIAGNOSIS — M5442 Lumbago with sciatica, left side: Secondary | ICD-10-CM | POA: Diagnosis not present

## 2023-03-02 NOTE — Therapy (Signed)
OUTPATIENT PHYSICAL THERAPY TREATMENT   Patient Name: Mason Moran MRN: [id number] DOB:03-Apr-1971, 52 y.o., male Today's Date: 03/02/2023  END OF SESSION:  PT End of Session - 03/02/23 1111     Visit Number 3    Date for PT Re-Evaluation 04/06/23    Authorization Type [employer] MCR    Progress Note Due on Visit 10    PT Start Time 1103    PT Stop Time 1146    PT Time Calculation (min) 43 min    Activity Tolerance Patient tolerated treatment well    Behavior During Therapy WFL for tasks assessed/performed               Past Medical History:  Diagnosis Date   HIV (human immunodeficiency virus infection) (HCC)    History reviewed. No pertinent surgical history. 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 06/12/2021   Anemia 06/12/2021   Elevated platelet count 06/12/2021   Transaminitis 06/12/2021   Vision loss     PCP: Hoy Register, MD  REFERRING PROVIDER: Angelina Sheriff, DO  REFERRING DIAG:  [phone] (ICD-10-CM) - Left-sided low back pain with left-sided sciatica, unspecified chronicity  R26.81 (ICD-10-CM) - Gait instability   RATIONALE FOR EVALUATION AND TREATMENT: Rehabilitation  THERAPY DIAG:  Radiculopathy, lumbar region  Cramp and spasm  Unsteadiness on feet  ONSET DATE: 2019 or 2020  NEXT MD VISIT: none scheduled   SUBJECTIVE:                                                                                                                                                                                            SUBJECTIVE STATEMENT: Pt reports his only pain being the L foot, he has a hard time putting on certain shoes.   EVAL: My whole back is tight. The cold weather makes it worse. Sitting is bad. But it can hit me when I'm walking. He had a fall 4 months ago, but always falls backwards. Sometimes his L leg is "loose". My leg gets numb from buttock down thigh to foot. Pain worse after sitting in recliner.  PERTINENT HISTORY:  HIV,  Blind left eye blind, losing R eye vision   PAIN:  Are you having pain? Yes: NPRS scale: 5/10 Pain location: usually above left buttock, but moving to the R Pain description: tightness Aggravating factors: unsure, sleeping on stomach Relieving factors: Sleeping on right side  PRECAUTIONS: Other: HIV+, vision impaired  RED FLAGS: None   WEIGHT BEARING RESTRICTIONS: No  FALLS:  Has patient fallen in last 6 months? Yes. Number of falls 1 got up out of bed and just fell  LIVING ENVIRONMENT: Lives with: lives with their family Lives in: House/apartment Stairs: Yes: Internal: 13 steps; on right going up Has following equipment at home: Single point cane  OCCUPATION: on assistance due to eyes  PLOF: Independent with household mobility with device and sister cooks for him, stays with his sister.  PATIENT GOALS: I want to wear my shoes   OBJECTIVE:  Note: Objective measures were completed at Evaluation unless otherwise noted.  DIAGNOSTIC FINDINGS:  LUMBAR XR: Mild/early degenerative changes. No acute osseous abnormalities.   PATIENT SURVEYS:  Modified Oswestry TBD  FOTO Lumbar FOTO = 34, predicted = 47  COGNITION:  Overall cognitive status: Within functional limits for tasks assessed     SENSATION: Not tested patient reports not sensation deficits today  MUSCLE LENGTH: Marked tightness of B erector spinae in throacic and lumbar  spines  POSTURE: rounded shoulders, decreased lumbar lordosis, and flexed trunk   PALPATION: Pain with UPA mobs to L4/5, reports relief with Pa mobs above L4. Very stiff L4/5, L5/S1; marked tightenss in bil lumbar and thoracic muscles.  LUMBAR ROM: pain and tightness with all motions, ext worse than flex  AROM eval  Flexion To shins  Extension WNL  Right lateral flexion 25%  Left lateral flexion 50%  Right rotation 75%  Left rotation 50%   (Blank rows = not tested)  LOWER EXTREMITY ROM:   WNL for tasks assessed  Active  Right eval Left eval  Hip flexion    Hip extension    Hip abduction    Hip adduction    Hip internal rotation    Hip external rotation    Knee flexion    Knee extension    Ankle dorsiflexion    Ankle plantarflexion    Ankle inversion    Ankle eversion     (Blank rows = not tested)  LOWER EXTREMITY MMT:    MMT Right eval Left eval  Hip flexion 5 5  Hip extension 5 5  Hip abduction 5 5  Hip adduction    Hip internal rotation    Hip external rotation    Knee flexion 5 5  Knee extension 5 5  Ankle dorsiflexion 4+ 4+  Ankle plantarflexion    Ankle inversion    Ankle eversion 5 4   (Blank rows = not tested)   FUNCTIONAL TESTS:  5 times sit to stand: 28.46 with light UE assist Dynamic Gait Index: TBD  GAIT: Distance walked: 20 Assistive device utilized: Single point cane Level of assistance: Modified independence Comments: antalgic  TODAY'S TREATMENT:  DATE:   03/02/23 Therapeutic Exercise: to improve strength and mobility.  Demo, verbal and tactile cues throughout for technique.  Nustep L5x2min UE/LE  DGI- 15/24 high risk for falls Supine LTR feet on orange pball x 10  Supine bent knee fallouts x 10 bil Supine march with TrA 2 x 10 bil Supine bridge x 10  Supine SKTC stretch 3x15" bil  02/25/23 THERAPEUTIC  ACTIVITIES: Lumbar FOTO = 34, predicted = 47 DGI: deferred today due to increased L foot pain  THERAPEUTIC EXERCISE: to improve flexibility, strength and mobility.  Demonstration, verbal and tactile cues throughout for technique.  NuStep - L4 x 6 min (UE/LE) Supine SKTC Stretch x 30" bil Hooklying LTR 10 x 10" Hooklying KTOS piriformis stretch x 30" bil Hooklying PPT 10 x 5" Hooklying TrA + hip ADD ball squeeze 10 x 5" Bridge 10 x 5"  SELF CARE: Provided education on home TENS unit options - TENS 7000 2nd edition vs Auvon unit   02/23/23 See pt ed and HEP  MODALITIES: Estim IFC 80-150 MHz x 10 min to lumbar and thoracic paraspinals in prone lying with MHP   PATIENT EDUCATION:  Education details: PT eval findings, anticipated POC, posture and body mechanics for typical daily postioning, mobility and household tasks, role of DN, and recommended he not stay in recliner for long periods and try to use lumbar support; also lie prone throughout the day if symptoms decrease Person educated: Patient Education method: Explanation and Demonstration Education comprehension: verbalized understanding  HOME EXERCISE PROGRAM: Access Code: [email or web address] URL: [email or web address] Date: 02/25/2023 Prepared by: Glenetta Hew  Exercises - Supine Single Knee to Chest Stretch  - 2 x daily - 7 x weekly - 3 reps - 30 sec hold - Supine Lower Trunk Rotation  - 2 x daily - 7 x weekly - 2 sets - 10 reps - 10 sec hold - Supine Piriformis Stretch with Foot on Ground  - 2 x daily - 7 x weekly - 3 reps - 30 sec hold - Supine Posterior Pelvic Tilt  - 2 x daily - 7 x weekly - 2 sets - 10 reps - 5 sec hold - Supine Hip Adduction Isometric with Ball  - 2 x daily - 7 x weekly - 2 sets - 10 reps - 5 sec hold - Supine Bridge  - 2 x daily - 7 x weekly - 2 sets - 10 reps - 5 sec hold   ASSESSMENT:  CLINICAL IMPRESSION: DGI score shows high risk for falls today. Exercises focused on gentle core  strengthening and hip mobility. Pt continues to have pain with daily activities, mostly reports issues with weed-whacking d/t LBP, also c/o L foot pain but he is going to [employer] store to find a better pair of shoes. Encouraged to do HEP to maximize results. Lion will benefit from continued skilled PT to address above deficits to improve mobility and activity tolerance with decreased pain interference.   OBJECTIVE IMPAIRMENTS: Abnormal gait, decreased activity tolerance, decreased ROM, decreased strength, hypomobility, increased muscle spasms, impaired flexibility, postural dysfunction, and pain.   ACTIVITY LIMITATIONS: lifting, bending, sitting, standing, stairs, transfers, and locomotion level  PARTICIPATION LIMITATIONS: shopping, community activity, and church  PERSONAL FACTORS: Time since onset of injury/illness/exacerbation and 1-2 comorbidities: neuropathy, impaired vision  are also affecting patient's functional outcome.   REHAB POTENTIAL: Good  CLINICAL DECISION MAKING: Evolving/moderate complexity  EVALUATION COMPLEXITY: Low   GOALS: Goals reviewed with patient? Yes  SHORT TERM GOALS: Target date: 03/16/2023  Patient will be independent with initial HEP.  Baseline:  Goal status: IN PROGRESS  2.  Patient will report centralization of radicular symptoms.  Baseline:  Goal status: IN PROGRESS  LONG TERM GOALS: Target date: 04/06/2023   Patient will be independent with advanced/ongoing HEP to improve outcomes and carryover.  Baseline:  Goal status: IN PROGRESS  2.  Patient will report 75% improvement in low back pain to improve QOL.  Baseline:  Goal status: IN PROGRESS  3.  Patient will demonstrate functional pain free lumbar ROM to perform ADLs.   Baseline:  Goal status: IN PROGRESS  4.  Patient will report predicted level (47) on lumbar FOTO to demonstrate improved functional ability.  Baseline: 34 Goal status: IN PROGRESS   7.   Patient to demonstrate ability  to achieve and maintain good spinal alignment/posturing and body mechanics needed for daily activities. Baseline:  Goal status: IN PROGRESS  8. Patient will score at least 19/24 on DGI to decrease risk of falls Baseline: TBD Goal status: IN PROGRESS   PLAN:  PT FREQUENCY: 2x/week  PT DURATION: 6 weeks  PLANNED INTERVENTIONS: 97110-Therapeutic exercises, 97530- Therapeutic activity, 97112- Neuromuscular re-education, 97535- Self Care, 16109- Manual therapy, [id number]- Gait training, 97014- Electrical stimulation (unattended), [phone]- Traction (mechanical), [id number]- Ionotophoresis 4mg /ml Dexamethasone, Patient/Family education, Balance training, Stair training, Taping, Dry Needling, Spinal mobilization, Cryotherapy, and Moist heat.  PLAN FOR NEXT SESSION:  Assist with TENS unit parameter setup if pt brings unit. Progress lumbar mobility, paraspinal flexibility (try extension biased exercises if tolerated). MT to erector spinae. Possible DN (precaution HIV+).    Darleene Cleaver, PTA  03/02/2023, 11:53 AM

## 2023-03-04 ENCOUNTER — Ambulatory Visit
Admission: RE | Admit: 2023-03-04 | Discharge: 2023-03-04 | Disposition: A | Discharge status: Home / Self Care | Payer: Medicare HMO | Source: Ambulatory Visit | Attending: Family Medicine | Admitting: Family Medicine

## 2023-03-04 DIAGNOSIS — F17210 Nicotine dependence, cigarettes, uncomplicated: Secondary | ICD-10-CM

## 2023-03-07 ENCOUNTER — Ambulatory Visit: Payer: Medicare HMO | Admitting: Physical Therapy

## 2023-03-09 ENCOUNTER — Ambulatory Visit: Payer: Medicare HMO

## 2023-03-10 ENCOUNTER — Other Ambulatory Visit: Payer: Self-pay | Admitting: Family Medicine

## 2023-03-10 DIAGNOSIS — G629 Polyneuropathy, unspecified: Secondary | ICD-10-CM

## 2023-03-10 NOTE — Telephone Encounter (Signed)
Requested Prescriptions  Pending Prescriptions Disp Refills   gabapentin (NEURONTIN) 300 MG capsule [Pharmacy Med Name: GABAPENTIN 300MG  CAPSULES] 90 capsule 2    Sig: TAKE 1 CAPSULE IN THE MORNING AND 2 AT BEDTIME     Neurology: Anticonvulsants - gabapentin Passed - 03/10/2023  8:58 AM      Passed - Cr in normal range and within 360 days    Creat  Date Value Ref Range Status  02/17/2023 0.92 0.70 - 1.30 mg/dL Final         Passed - Completed PHQ-2 or PHQ-9 in the last 360 days      Passed - Valid encounter within last 12 months    Recent Outpatient Visits           3 weeks ago Encounter for [employer] annual wellness exam   [facility] [place], [place], [place]   3 months ago Symptomatic HIV infection [facility])   [place] [facility] [place], Odette Horns, MD   7 months ago Neuropathy   [facility] Hoy Register, MD   1 year ago Recurrent boils   [facility] [place], [place], [place]   1 year ago Sepsis due to RLL lung abscess/necrotizing pneumonia   [place] [facility] Hoy Register, MD       Future Appointments             In 2 months Hoy Register, MD [facility]   In 5 months [place], Belia Heman, MD [facility] [place] [facility], [facility]

## 2023-03-14 ENCOUNTER — Telehealth: Payer: Self-pay

## 2023-03-14 NOTE — Telephone Encounter (Signed)
I don't see this mentioned in Dr. Ephriam Knuckles note and no one from pharmacy discussed with him either. Not sure if he is a good candidate or not with his history of uncontrolled HIV. No genotypes available either. May suggest getting a genosure archive before proceeding but will let Dr. Luciana Axe weigh in.

## 2023-03-14 NOTE — Telephone Encounter (Signed)
Patient called office today stating he would like to start Cabenuva injections. States he spoke about this at his last appointment. Will forward message to provider and pharmacy team. Juanita Laster, RMA

## 2023-03-15 ENCOUNTER — Other Ambulatory Visit (HOSPITAL_COMMUNITY): Payer: Self-pay

## 2023-03-15 NOTE — Telephone Encounter (Signed)
Thank you :)

## 2023-03-15 NOTE — Progress Notes (Unsigned)
HPI: Mason Moran is a 52 y.o. male who presents to the [facility] pharmacy clinic for discussion of Cabenuva initiation.  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   BICTEGRAVIR-EMTRICITABINE-TENOFOVIR AF (BIKTARVY) 50-200-25 MG TABS TABLET    Take 1 tablet by mouth daily.   CHLORHEXIDINE (HIBICLENS) 4 % EXTERNAL LIQUID    Apply topically daily. For 5 days   CLINDAMYCIN (CLEOCIN) 300 MG CAPSULE    Take 1 capsule (300 mg total) by mouth 3 (three) times daily.   CLOTRIMAZOLE (LOTRIMIN) 1 % CREAM    Apply 1 Application topically 2 (two) times daily.   DOXYCYCLINE (VIBRA-TABS) 100 MG TABLET    Take 1 tablet (100 mg total) by mouth 2 (two) times daily.   DULOXETINE (CYMBALTA) 30 MG CAPSULE    Take 1 capsule (30 mg total) by mouth daily.   GABAPENTIN (NEURONTIN) 300 MG CAPSULE    TAKE 1 CAPSULE IN THE MORNING AND 2 AT BEDTIME   MISC. DEVICES MISC    Cane. Diagnosis- lower back pain   MUPIROCIN OINTMENT (BACTROBAN) 2 %    Apply 1 Application topically 2 (two) times daily. X 1 week Prn boils   MUPIROCIN OINTMENT (BACTROBAN)  2 %    Place 1 Application into the nose 2 (two) times daily. For 5 days   VALACYCLOVIR (VALTREX) 500 MG TABLET    TAKE 1 TABLET BY MOUTH TWICE DAILY  Modified Medications   No medications on file  Discontinued Medications   No medications on file    Allergies: Allergies  Allergen Reactions   Dapsone Other (See Comments)    Other reaction(s): Other (See Comments) G6PD deficiency   Other Other (See Comments)    Seafood - can't speak   Primaquine     Other reaction(s): Other (See Comments) G6PD deficiency    Labs: Lab Results  Component Value Date   HIV1RNAQUANT 36 (H) 02/17/2023   HIV1RNAQUANT Not Detected 10/08/2022   HIV1RNAQUANT 60 (H) 05/13/2022   CD4TABS 277 (L) 02/17/2023   CD4TABS 277 (L) 05/13/2022   CD4TABS 196 (L) 01/12/2022    RPR and STI Lab Results  Component Value Date   LABRPR NON-REACTIVE 02/17/2023   LABRPR NON-REACTIVE 05/13/2022   LABRPR NON-REACTIVE 07/02/2021    STI Results GC CT  05/13/2022  9:10 AM Negative  Negative     Hepatitis B Lab Results  Component Value Date   HEPBSAG NON REACTIVE 06/13/2021   Hepatitis C No results found for: "HEPCAB", "HCVRNAPCRQN" Hepatitis A No results found for: "HAV" Lipids: Lab Results  Component Value Date   CHOL 130 02/17/2023  TRIG 111 02/17/2023   HDL 67 02/17/2023   CHOLHDL 1.9 02/17/2023   LDLCALC 44 02/17/2023    Current HIV Regimen: Biktarvy  Assessment: Chadric presents today to discuss initiation of Cabenuva. He is currently prescribed Biktarvy. Review of refill history shows approximately 2 delayed fills in last 6 months. He reports adherence to [facility], with occasional 1 missed dose every few weeks. Jacquelyn is very confident in his ability to make it to appointments, and would much prefer not having to keep tablets at home.  Counseled that [place] is two separate intramuscular injections in the gluteal muscle on each side for each visit. Explained that the second injection is 30 days  after the initial injection then every 2 months thereafter. Discussed the rare but significant chance of developing resistance despite compliance. Explained that showing up to injection appointments is very important and warned that if 2 appointments are missed, it will be reassessed by their provider whether they are a good candidate for injection therapy. Counseled on possible side effects associated with the injections such as injection site pain, which is usually mild to moderate in nature, injection site nodules, and injection site reactions.  Historical Genosure @ [facility] 02/10/2018 via Care Everywhere:    Notable resistances: Efavirenz, nevirapine NNRTI mutations: K103N NRTI mutations: N/a PI mutations: N/a INSTI mutations: N/a  We will obtain a Genosure today to confirm susceptibility to cabotegravir and rilpivirine given his previous use of Atripla (NNRTI regimen).  Plan: - Obtain updated Genosure - Will follow up results of Genosure to be sure no concerns of resistances - Call with any issues or questions  Lora Paula, PharmD PGY-2 Infectious Diseases Pharmacy Resident [facility] 03/16/2023 2:17 PM

## 2023-03-15 NOTE — Telephone Encounter (Signed)
Cece - do you mind reaching out to him to schedule a follow up with Dr. Luciana Axe to discuss and get lab work? If he isn't available in a reasonable amount of time, it is fine to schedule with me or Marchelle Folks. Thanks!

## 2023-03-16 ENCOUNTER — Other Ambulatory Visit: Payer: Self-pay

## 2023-03-16 ENCOUNTER — Encounter: Payer: Self-pay | Admitting: Physical Medicine and Rehabilitation

## 2023-03-16 ENCOUNTER — Encounter: Payer: Medicare HMO | Admitting: Physical Therapy

## 2023-03-16 ENCOUNTER — Ambulatory Visit (INDEPENDENT_AMBULATORY_CARE_PROVIDER_SITE_OTHER): Payer: Medicare HMO | Admitting: Pharmacist

## 2023-03-16 DIAGNOSIS — B20 Human immunodeficiency virus [HIV] disease: Secondary | ICD-10-CM

## 2023-03-22 ENCOUNTER — Ambulatory Visit: Payer: Medicaid Other | Admitting: Internal Medicine

## 2023-03-22 ENCOUNTER — Ambulatory Visit: Payer: Medicare HMO | Admitting: Internal Medicine

## 2023-03-22 LAB — SPECIMEN STATUS REPORT

## 2023-03-24 ENCOUNTER — Encounter: Payer: Medicare HMO | Admitting: Physical Therapy

## 2023-03-24 LAB — GENOSURE ARCHIVE(SM)

## 2023-03-24 LAB — SPECIMEN STATUS REPORT

## 2023-03-26 LAB — SPECIMEN STATUS REPORT

## 2023-03-30 ENCOUNTER — Encounter: Payer: Medicare HMO | Admitting: Physical Therapy

## 2023-04-01 ENCOUNTER — Ambulatory Visit
Admission: RE | Admit: 2023-04-01 | Discharge: 2023-04-01 | Disposition: A | Discharge status: Home / Self Care | Payer: Medicare HMO | Source: Ambulatory Visit | Attending: Physical Medicine and Rehabilitation | Admitting: Physical Medicine and Rehabilitation

## 2023-04-01 ENCOUNTER — Telehealth: Payer: Self-pay

## 2023-04-01 DIAGNOSIS — M47816 Spondylosis without myelopathy or radiculopathy, lumbar region: Secondary | ICD-10-CM | POA: Diagnosis not present

## 2023-04-01 DIAGNOSIS — R2681 Unsteadiness on feet: Secondary | ICD-10-CM

## 2023-04-01 DIAGNOSIS — M5442 Lumbago with sciatica, left side: Secondary | ICD-10-CM

## 2023-04-01 NOTE — Telephone Encounter (Signed)
Call from Tiffany at [facility] with results of MRI.    IMPRESSION: 1. Lung-RADS 3, probably benign findings. Short-term follow-up in 6 months is recommended with repeat low-dose chest CT without contrast (please use the following order, "CT CHEST LCS NODULE FOLLOW-UP W/O CM"). Solid 5.3 mm medial left lower lobe pulmonary nodule is new from 06/13/2021 chest CT. 2.  Emphysema (ICD10-J43.9)

## 2023-04-01 NOTE — Telephone Encounter (Signed)
Addressed.

## 2023-04-06 ENCOUNTER — Encounter: Payer: Medicare HMO | Admitting: Physical Therapy

## 2023-04-25 ENCOUNTER — Encounter: Payer: Self-pay | Admitting: Physical Medicine and Rehabilitation

## 2023-05-10 ENCOUNTER — Other Ambulatory Visit: Payer: Self-pay | Admitting: Family Medicine

## 2023-05-18 ENCOUNTER — Encounter: Payer: Medicare HMO | Attending: Physical Medicine and Rehabilitation | Admitting: Physical Medicine and Rehabilitation

## 2023-05-18 DIAGNOSIS — R2681 Unsteadiness on feet: Secondary | ICD-10-CM | POA: Insufficient documentation

## 2023-05-18 DIAGNOSIS — G63 Polyneuropathy in diseases classified elsewhere: Secondary | ICD-10-CM | POA: Insufficient documentation

## 2023-05-18 DIAGNOSIS — B20 Human immunodeficiency virus [HIV] disease: Secondary | ICD-10-CM | POA: Insufficient documentation

## 2023-05-18 DIAGNOSIS — M5442 Lumbago with sciatica, left side: Secondary | ICD-10-CM | POA: Insufficient documentation

## 2023-05-24 ENCOUNTER — Ambulatory Visit: Payer: Medicare HMO | Attending: Family Medicine | Admitting: Family Medicine

## 2023-05-24 ENCOUNTER — Encounter: Payer: Self-pay | Admitting: Family Medicine

## 2023-05-24 VITALS — BP 119/78 | HR 71 | Ht 72.0 in | Wt 181.4 lb

## 2023-05-24 DIAGNOSIS — J439 Emphysema, unspecified: Secondary | ICD-10-CM | POA: Insufficient documentation

## 2023-05-24 DIAGNOSIS — F17210 Nicotine dependence, cigarettes, uncomplicated: Secondary | ICD-10-CM

## 2023-05-24 DIAGNOSIS — Z1211 Encounter for screening for malignant neoplasm of colon: Secondary | ICD-10-CM

## 2023-05-24 DIAGNOSIS — B20 Human immunodeficiency virus [HIV] disease: Secondary | ICD-10-CM

## 2023-05-24 DIAGNOSIS — G629 Polyneuropathy, unspecified: Secondary | ICD-10-CM

## 2023-05-24 DIAGNOSIS — L0292 Furuncle, unspecified: Secondary | ICD-10-CM

## 2023-05-24 DIAGNOSIS — R911 Solitary pulmonary nodule: Secondary | ICD-10-CM

## 2023-05-24 MED ORDER — BUPROPION HCL ER (XL) 150 MG PO TB24: 150.0000 mg | ORAL_TABLET | Freq: Every day | ORAL | 1 refills | Status: DC

## 2023-05-24 MED ORDER — GABAPENTIN 300 MG PO CAPS: ORAL_CAPSULE | ORAL | 1 refills | Status: DC

## 2023-05-24 MED ORDER — DULOXETINE HCL 30 MG PO CPEP: 30.0000 mg | ORAL_CAPSULE | Freq: Every day | ORAL | 1 refills | Status: DC

## 2023-05-24 MED ORDER — CLINDAMYCIN HCL 300 MG PO CAPS: 300.0000 mg | ORAL_CAPSULE | Freq: Three times a day (TID) | ORAL | 0 refills | Status: DC

## 2023-05-24 NOTE — Patient Instructions (Signed)
 VISIT SUMMARY:  During today's visit, we discussed your recent loss of medications, recurrence of boils, smoking habits, emphysema, lung nodule, and the need for colon cancer screening. We have made several adjustments to your treatment plan to address these issues.  YOUR PLAN:   -RECURRENT BOILS: Boils are painful, pus-filled bumps that form under your skin when bacteria infect and inflame one or more of your hair follicles. You have developed boils under your arms again. We have prescribed a 7-day course of Clindamycin  to treat the current infection. Please take the medication as directed and monitor the area for any changes.  -TOBACCO USE: Smoking can cause serious health issues, including lung damage and cancer. You have started smoking again but expressed a desire to quit. We have prescribed medication to help reduce your cravings. Quitting smoking will help prevent further lung damage and reduce your risk of cancer.  -EMPHYSEMA: Emphysema is a lung condition that causes shortness of breath. It is often caused by smoking. We encourage you to quit smoking to prevent further lung damage. If your symptoms worsen, we can prescribe an inhaler to help you breathe more easily.  -LUNG NODULE: A lung nodule is a small growth in the lung that can be benign or cancerous. Your nodule is not currently cancerous, but we need to monitor it. We plan to repeat your CT scan in April to check for any changes.  -COLON CANCER SCREENING: Colon cancer screening is important for early detection and prevention. You have not had a colonoscopy yet. We will refer you to a GI specialist to schedule this important screening.    INSTRUCTIONS:  Please follow up with the GI specialist for your colonoscopy. We will repeat your CT scan in April to monitor the lung nodule. If you experience any worsening symptoms or have any concerns, please contact our office.

## 2023-05-24 NOTE — Progress Notes (Signed)
 Subjective:  Patient ID: Mason Moran, male    DOB: 16-Jul-1970  Age: 53 y.o. MRN: [id number]  CC: Medical Management of Chronic Issues (Recurrent boils/Refills on medication due to losing all medication)   HPI Mason Moran is a 53 y.o. 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  He presents for a medication refill after losing his medications. He reports that he has been able to maintain his HIV medication regimen. The patient also reports that he has started to develop boils again under his arms.  The patient also reports a recent boil under his arm that started to appear on Thursday. He denies any fever associated with the boil.      The patient has a history of smoking and has recently started smoking cigarettes again after a period of cessation. He reports that he only smokes in the mornings and that a pack of cigarettes lasts him three days. He expresses a desire to quit smoking again and is open to medication to help with cravings.  The patient has been diagnosed with emphysema is done his low-dose lung CT screen which also revealed presence of 5.3 mm medial left lower lobe pulmonary nodule.  He reports occasional coughing, particularly when he smokes in the mornings. He expresses a desire to quit smoking to reverse the emphysema and reduce his risk of lung cancer.     Past Medical History:  Diagnosis Date   HIV (human immunodeficiency virus infection) (HCC)     No past surgical history on file.  No family history on file.  Social History   Socioeconomic History   Marital status: Single    Spouse name: Not on file   Number of children: Not on file   Years of education: Not on file   Highest education  level: Not on file  Occupational History   Not on file  Tobacco Use   Smoking status: Every Day    Current packs/day: 0.50    Types: Cigarettes   Smokeless tobacco: Never  Vaping Use   Vaping status: Never Used  Substance and Sexual Activity   Alcohol use: Not Currently    Comment: none in a month   Drug use: Yes    Types: Marijuana    Comment: daily   Sexual activity: Not on file    Comment: declined condoms  Other Topics Concern   Not on file  Social History Narrative   Not on file   Social Drivers of Health   Financial Resource Strain: Low Risk  (05/24/2023)   Overall Financial Resource Strain (CARDIA)    Difficulty of Paying Living Expenses: Not very hard  Food Insecurity: No Food Insecurity (05/24/2023)   Hunger Vital Sign    Worried About Running Out of Food in the Last Year: Never true    Ran Out of Food in the Last Year: Never true  Transportation Needs: No Transportation Needs (05/24/2023)   PRAPARE - Administrator, Civil Service (Medical): No    Lack of Transportation (Non-Medical): No  Physical Activity: Sufficiently Active (05/24/2023)   Exercise Vital Sign    Days of Exercise per Week: 7 days    Minutes of Exercise per Session: 30 min  Stress: No Stress Concern Present (  05/24/2023)   [employer] - Occupational Stress Questionnaire    Feeling of Stress : Not at all  Social Connections: Moderately Integrated (05/24/2023)   Social Connection and Isolation Panel [NHANES]    Frequency of Communication with Friends and Family: Twice a week    Frequency of Social Gatherings with Friends and Family: More than three times a week    Attends Religious Services: 1 to 4 times per year    Active Member of [employer] or Organizations: Yes    Attends Engineer, Structural: More than 4 times per year    Marital Status: Never married    Allergies  Allergen Reactions   Dapsone Other (See Comments)    Other reaction(s): Other (See  Comments) G6PD deficiency   Other Other (See Comments)    Seafood - can't speak   Primaquine     Other reaction(s): Other (See Comments) G6PD deficiency    Outpatient Medications Prior to Visit  Medication Sig Dispense Refill   bictegravir-emtricitabine -tenofovir  AF (BIKTARVY ) 50-200-25 MG TABS tablet Take 1 tablet by mouth daily. 30 tablet 11   doxycycline  (VIBRA -TABS) 100 MG tablet Take 1 tablet (100 mg total) by mouth 2 (two) times daily. 14 tablet 0   valACYclovir  (VALTREX ) 500 MG tablet TAKE 1 TABLET BY MOUTH TWICE DAILY 60 tablet 1   DULoxetine  (CYMBALTA ) 30 MG capsule TAKE 1 CAPSULE(30 MG) BY MOUTH DAILY 30 capsule 0   baclofen  (LIORESAL ) 10 MG tablet Take 1 tablet (10 mg total) by mouth 2 (two) times daily as needed for muscle spasms. (Patient not taking: Reported on 05/24/2023) 60 each 3   chlorhexidine  (HIBICLENS ) 4 % external liquid Apply topically daily. For 5 days (Patient not taking: Reported on 05/24/2023) 473 mL 1   clotrimazole  (LOTRIMIN ) 1 % cream Apply 1 Application topically 2 (two) times daily. (Patient not taking: Reported on 05/24/2023) 85 g 3   Misc. Devices [employer] Diagnosis- lower back pain (Patient not taking: Reported on 05/24/2023) 1 each 0   mupirocin  ointment (BACTROBAN ) 2 % Apply 1 Application topically 2 (two) times daily. X 1 week Prn boils (Patient not taking: Reported on 05/24/2023) 30 g 2   mupirocin  ointment (BACTROBAN ) 2 % Place 1 Application into the nose 2 (two) times daily. For 5 days (Patient not taking: Reported on 05/24/2023) 22 g 1   clindamycin  (CLEOCIN ) 300 MG capsule Take 1 capsule (300 mg total) by mouth 3 (three) times daily. (Patient not taking: Reported on 05/24/2023) 21 capsule 0   gabapentin  (NEURONTIN ) 300 MG capsule TAKE 1 CAPSULE IN THE MORNING AND 2 AT BEDTIME (Patient not taking: Reported on 05/24/2023) 90 capsule 2   No facility-administered medications prior to visit.     ROS Review of Systems  Constitutional:  Negative for  activity change and appetite change.  HENT:  Negative for sinus pressure and sore throat.   Eyes:  Positive for visual disturbance.  Respiratory:  Negative for chest tightness, shortness of breath and wheezing.   Cardiovascular:  Negative for chest pain and palpitations.  Gastrointestinal:  Negative for abdominal distention, abdominal pain and constipation.  Genitourinary: Negative.   Musculoskeletal: Negative.   Psychiatric/Behavioral:  Negative for behavioral problems and dysphoric mood.     Objective:  BP 119/78   Pulse 71   Ht 6' (1.829 m)   Wt 181 lb 6.4 oz (82.3 kg)   SpO2 99%   BMI 24.60 kg/m      05/24/2023    2:04  PM 02/17/2023   11:08 AM 02/17/2023    9:51 AM  BP/Weight  Systolic BP 119 120 132  Diastolic BP 78 78 81  Wt. (Lbs) 181.4 167.8 168.6  BMI 24.6 kg/m2 22.76 kg/m2 22.87 kg/m2      Physical Exam Constitutional:      Appearance: He is well-developed.  Cardiovascular:     Rate and Rhythm: Normal rate.     Heart sounds: Normal heart sounds. No murmur heard. Pulmonary:     Effort: Pulmonary effort is normal.     Breath sounds: Normal breath sounds. No wheezing or rales.  Chest:     Chest wall: No tenderness.  Abdominal:     General: Bowel sounds are normal. There is no distension.     Palpations: Abdomen is soft. There is no mass.     Tenderness: There is no abdominal tenderness.  Musculoskeletal:        General: Normal range of motion.     Right lower leg: No edema.     Left lower leg: No edema.  Skin:    Comments: Slightly tender for en caul in right axilla not draining.  No erythema  Neurological:     Mental Status: He is alert and oriented to person, place, and time.  Psychiatric:        Mood and Affect: Mood normal.        Latest Ref Rng & Units 02/17/2023   11:32 PM 05/13/2022    9:22 AM 07/02/2021    4:03 PM  CMP  Glucose 65 - 99 mg/dL 89  68  86   BUN 7 - 25 mg/dL 8  8  11    Creatinine 0.70 - 1.30 mg/dL 9.07  9.03  9.13   Sodium  135 - 146 mmol/L 138  142  139   Potassium 3.5 - 5.3 mmol/L 4.2  4.3  4.3   Chloride 98 - 110 mmol/L 103  106  103   CO2 20 - 32 mmol/L 28  29  29    Calcium  8.6 - 10.3 mg/dL 9.8  9.5  9.4   Total Protein 6.1 - 8.1 g/dL 7.8  8.0  8.0   Total Bilirubin 0.2 - 1.2 mg/dL 0.6  0.6  0.2   AST 10 - 35 U/L 16  14  16    ALT 9 - 46 U/L 12  14  20      Lipid Panel     Component Value Date/Time   CHOL 130 02/17/2023 2332   TRIG 111 02/17/2023 2332   HDL 67 02/17/2023 2332   CHOLHDL 1.9 02/17/2023 2332   LDLCALC 44 02/17/2023 2332    CBC    Component Value Date/Time   WBC 6.2 02/17/2023 2332   RBC 5.07 02/17/2023 2332   HGB 15.0 02/17/2023 2332   HGB 12.3 (L) 07/21/2021 1531   HCT 47.8 02/17/2023 2332   HCT 36.9 (L) 07/21/2021 1531   PLT 278 02/17/2023 2332   PLT 277 07/21/2021 1531   MCV 94.3 02/17/2023 2332   MCV 90 07/21/2021 1531   MCH 29.6 02/17/2023 2332   MCHC 31.4 (L) 02/17/2023 2332   RDW 12.4 02/17/2023 2332   RDW 17.7 (H) 07/21/2021 1531   LYMPHSABS 1,773 02/17/2023 2332   LYMPHSABS 1.7 07/21/2021 1531   MONOABS 0.6 06/12/2021 1130   EOSABS 316 02/17/2023 2332   EOSABS 1.1 (H) 07/21/2021 1531   BASOSABS 62 02/17/2023 2332   BASOSABS 0.0 07/21/2021 1531    Lab Results  Component Value Date   HGBA1C 4.8 07/21/2021    Assessment & Plan:  Recurrent Boils Patient reported recurrence of boils under the arm. No fever reported. -Prescribe a 7-day course of Clindamycin  for current infection.  Tobacco Use Patient reported smoking cigarettes with a pack lasting three days. Patient expressed interest in quitting. -Prescribe bupropion  to help reduce cravings. -Encourage patient to quit smoking to prevent further lung damage and potential cancer risk. -Low-dose chest CT was done in 03/2023 with finding of lung nodule.  Follow-up imaging in 09/2023  Emphysema Patient reported coughing and shortness of breath, especially after smoking. Emphysema noted on previous CT  scan. -Encourage patient to quit smoking to prevent further lung damage. -Offer inhaler prescription if symptoms worsen as he declines inhaler initiation today.  Lung Nodule Nodule noted on previous CT scan, not currently cancerous. -Plan to repeat CT scan in May to monitor nodule growth.  Colon Cancer Screening Patient has not had a colonoscopy. -Refer patient to GI specialist for colonoscopy.          Meds ordered this encounter  Medications   gabapentin  (NEURONTIN ) 300 MG capsule    Sig: TAKE 1 CAPSULE IN THE MORNING AND 2 AT BEDTIME    Dispense:  270 capsule    Refill:  1   clindamycin  (CLEOCIN ) 300 MG capsule    Sig: Take 1 capsule (300 mg total) by mouth 3 (three) times daily.    Dispense:  21 capsule    Refill:  0   DULoxetine  (CYMBALTA ) 30 MG capsule    Sig: Take 1 capsule (30 mg total) by mouth daily.    Dispense:  90 capsule    Refill:  1   buPROPion  (WELLBUTRIN  XL) 150 MG 24 hr tablet    Sig: Take 1 tablet (150 mg total) by mouth daily. For smoking cessation    Dispense:  90 tablet    Refill:  1    Follow-up: Return in about 4 months (around 09/21/2023) for Chronic medical conditions.       Corrina Sabin, MD, FAAFP. [facility] [place], [place] [phone]   05/24/2023, 2:27 PM

## 2023-06-08 ENCOUNTER — Other Ambulatory Visit: Payer: Self-pay | Admitting: Internal Medicine

## 2023-06-08 ENCOUNTER — Other Ambulatory Visit: Payer: Self-pay | Admitting: Physical Medicine and Rehabilitation

## 2023-06-09 ENCOUNTER — Other Ambulatory Visit: Payer: Self-pay | Admitting: Family Medicine

## 2023-06-09 ENCOUNTER — Telehealth: Payer: Self-pay | Admitting: Pharmacist

## 2023-06-09 DIAGNOSIS — G629 Polyneuropathy, unspecified: Secondary | ICD-10-CM

## 2023-06-09 NOTE — Telephone Encounter (Signed)
Called pharmacy to verify receipt confirmed by pharmacy 05/24/23 at 2:25 pm. Pharmacy staff reports patient picked up Rx on 05/24/23.

## 2023-06-09 NOTE — Telephone Encounter (Signed)
Requested by interface surescripts. Receipt confirmed by pharmacy 05/24/23 at 2:25 pm. Called pharmacy and staff reports patient picked up Rx 05/24/23. Duplicate request.  Requested Prescriptions  Refused Prescriptions Disp Refills   gabapentin (NEURONTIN) 300 MG capsule [Pharmacy Med Name: GABAPENTIN 300MG  CAPSULES] 90 capsule     Sig: TAKE 1 CAPSULE IN THE MORNING AND 2 AT BEDTIME     Neurology: Anticonvulsants - gabapentin Passed - 06/09/2023  3:32 PM      Passed - Cr in normal range and within 360 days    Creat  Date Value Ref Range Status  02/17/2023 0.92 0.70 - 1.30 mg/dL Final         Passed - Completed PHQ-2 or PHQ-9 in the last 360 days      Passed - Valid encounter within last 12 months    Recent Outpatient Visits           2 weeks ago Neuropathy   [facility] - A Dept Of [place]. [facility] Hoy Register, MD   3 months ago Encounter for [employer] annual wellness exam   [facility] - A Dept Of [place]. [facility] Hoy Register, MD   6 months ago Symptomatic HIV infection [facility])   [facility] - A Dept Of [place]. [facility] Hoy Register, MD   10 months ago Neuropathy   [facility] - A Dept Of [place]. [facility] Hoy Register, MD   1 year ago Recurrent boils   [facility] - A Dept Of [place]. [facility] [place], Marzella Schlein, [place]       Future Appointments             In 2 months Comer, Belia Heman, MD [facility] - A Dept Of [place]. [facility]   In 3 months Hoy Register, MD [facility] - A Dept Of [place]. [facility]

## 2023-06-09 NOTE — Telephone Encounter (Signed)
Reviewed Genosure Archive results from November 2024. 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 Although archives are very sensitive and pick up most mutations, it certainly could have missed one of these additional mutations, so the conservative approach is to avoid Cabenuva altogether. Will attach a copy of the archive results to the media tab and include a genotype table below.  Reviewed results with patient who was very understanding and was ok with continuing Biktarvy. He will follow up with you in April.  Margarite Gouge, PharmD, CPP, BCIDP, AAHIVP Clinical Pharmacist Practitioner Infectious Diseases Clinical Pharmacist [facility] for Infectious Disease

## 2023-06-09 NOTE — Telephone Encounter (Signed)
Cumulative HIV Genotype Data  Genotype Dates: Genosure Archive 03/2023  RT Mutations K103N, V179VD, V35VI, D123E, D177E, G196GE, E248D, A272P, K281R, T286A, E297R, V317VA, S322T, P345HY, R358RK, A360T, K366R, T377IL, A400T  PI Mutations  T12Q, E35ED, L63P, I64L  Integrase Mutations  M50I, I84V, T122TI, M154I, V201I, K215N, N254K   Interpretation of Genotype Data per [employer] HIV Drug Resistance Database:  Nucleoside RTIs  Abacavir - susceptible Zidovudine - susceptible Emtricitabine - susceptible Lamivudine - susceptible Tenofovir - susceptible   Non-Nucleoside RTIs  Doravirine - susceptible Efavirenz - high-level resistance Etravirine - potential low-level resistance Nevirapine - high-level resistance Rilpivirine - potential low-level resistance   Protease Inhibitors  Atazanavir - susceptible Darunavir - susceptible Lopinavir - susceptible   Integrase Inhibitors  Bictegravir - susceptible Cabotegravir - susceptible Dolutegravir - susceptible Elvitegravir - susceptible Raltegravir - susceptible   Margarite Gouge, PharmD, CPP, BCIDP, AAHIVP Clinical Pharmacist Practitioner Infectious Diseases Clinical Pharmacist [facility]

## 2023-06-10 ENCOUNTER — Other Ambulatory Visit: Payer: Self-pay | Admitting: *Deleted

## 2023-07-15 ENCOUNTER — Other Ambulatory Visit: Payer: Self-pay | Admitting: Pharmacist

## 2023-07-15 ENCOUNTER — Telehealth: Payer: Self-pay

## 2023-07-15 MED ORDER — BICTEGRAVIR-EMTRICITAB-TENOFOV 50-200-25 MG PO TABS: 1.0000 | ORAL_TABLET | Freq: Every day | ORAL | Status: AC

## 2023-07-15 NOTE — Telephone Encounter (Signed)
 Patient called, states he lost his Biktarvy. Encouraged him to call [facility] to see if they can do a lost medication override. Asked him to call back if he's still having issues getting his medication.   Sandie Ano, RN

## 2023-07-15 NOTE — Progress Notes (Signed)
 Medication Samples have been provided to the patient.  Drug name: Biktarvy        Strength: 50/200/25 mg       Qty: 7 tablets (1 bottles) LOT: [id number]   Exp.Date: 6/27  Dosing instructions: Take one tablet by mouth once daily  The patient has been instructed regarding the correct time, dose, and frequency of taking this medication, including desired effects and most common side effects.   Margarite Gouge, PharmD, CPP, BCIDP, AAHIVP Clinical Pharmacist Practitioner Infectious Diseases Clinical Pharmacist [facility] for Infectious Disease

## 2023-07-15 NOTE — Telephone Encounter (Signed)
 Mason Moran called back, he states that [facility] will not be able to dispense his medication until Monday 3/10. He will come by the clinic to pick up Biktarvy sample to prevent lapse in medication.   Sandie Ano, RN

## 2023-07-19 ENCOUNTER — Telehealth: Payer: Self-pay

## 2023-07-19 NOTE — Telephone Encounter (Signed)
 Dental staff notified triage that patient came in for dental work today. He has a cyst under his right jaw that is the size of a golf ball and one on the left side that is the size of a grape.  Patient told them it started Saturday. Dental staff state they did an xray and imaging was not concerning for any dental issue.  Dental was wondering if patient could be worked in for evaluation today. Relayed that there is no availability.   Mason Moran, he reports the area was sore on Saturday. Denies any drainage from either site and states it is not interfering with his breathing.   Encouraged him to follow up with his PCP. If they're unable to see him, recommended he go to urgent care. Patient verbalized understanding and has no further questions.   Sandie Ano, RN

## 2023-07-21 NOTE — Progress Notes (Signed)
 Samples were "returned to stock" on 07/21/23 as patient never  came to pick them up.   Margarite Gouge, PharmD, CPP, BCIDP, AAHIVP Clinical Pharmacist Practitioner Infectious Diseases Clinical Pharmacist [facility] for Infectious Disease

## 2023-08-16 ENCOUNTER — Other Ambulatory Visit (HOSPITAL_COMMUNITY)
Admission: RE | Admit: 2023-08-16 | Discharge: 2023-08-16 | Disposition: A | Discharge status: Home / Self Care | Source: Ambulatory Visit | Attending: Internal Medicine | Admitting: Internal Medicine

## 2023-08-16 ENCOUNTER — Ambulatory Visit: Admitting: Internal Medicine

## 2023-08-16 ENCOUNTER — Other Ambulatory Visit: Payer: Self-pay

## 2023-08-16 ENCOUNTER — Ambulatory Visit

## 2023-08-16 ENCOUNTER — Encounter: Payer: Self-pay | Admitting: Internal Medicine

## 2023-08-16 VITALS — BP 127/81 | HR 57 | Temp 97.9°F | Wt 184.0 lb

## 2023-08-16 DIAGNOSIS — B20 Human immunodeficiency virus [HIV] disease: Secondary | ICD-10-CM

## 2023-08-16 DIAGNOSIS — B379 Candidiasis, unspecified: Secondary | ICD-10-CM

## 2023-08-16 DIAGNOSIS — Z79899 Other long term (current) drug therapy: Secondary | ICD-10-CM | POA: Diagnosis not present

## 2023-08-16 MED ORDER — CLOTRIMAZOLE 1 % EX CREA: 1.0000 | TOPICAL_CREAM | Freq: Two times a day (BID) | CUTANEOUS | 3 refills | Status: DC

## 2023-08-16 MED ORDER — BICTEGRAVIR-EMTRICITAB-TENOFOV 50-200-25 MG PO TABS: 1.0000 | ORAL_TABLET | Freq: Every day | ORAL | 11 refills | Status: AC

## 2023-08-16 NOTE — Progress Notes (Signed)
 RFV: follow up for hiv disease  Patient ID: Mason Moran, male   DOB: [birth year], 53 y.o.   MRN: [id number]  HPI  Mason Moran is a 53yo M with hiv disease, on biktarvy , cd 4 count of 277/ VL 36.   Hx of retinal detachment -on left eye. Blurry vision on right eye. He currently takes biktarvy . Reports late on a few doses but not missing doses. He reports that his sexual preference is with women. No recent unprotected sex. He does notice having rash/jock itch in groin area. Reports sweating in underwear   Outpatient Encounter Medications as of 08/16/2023  Medication Sig   baclofen  (LIORESAL ) 10 MG tablet TAKE 1 TABLET(10 MG) BY MOUTH TWICE DAILY AS NEEDED FOR MUSCLE SPASMS   bictegravir-emtricitabine -tenofovir  AF (BIKTARVY ) 50-200-25 MG TABS tablet Take 1 tablet by mouth daily.   buPROPion  (WELLBUTRIN  XL) 150 MG 24 hr tablet Take 1 tablet (150 mg total) by mouth daily. For smoking cessation   chlorhexidine  (HIBICLENS ) 4 % external liquid Apply topically daily. For 5 days (Patient not taking: Reported on 05/24/2023)   clindamycin  (CLEOCIN ) 300 MG capsule Take 1 capsule (300 mg total) by mouth 3 (three) times daily.   clotrimazole  (LOTRIMIN ) 1 % cream Apply 1 Application topically 2 (two) times daily. (Patient not taking: Reported on 05/24/2023)   doxycycline  (VIBRA -TABS) 100 MG tablet Take 1 tablet (100 mg total) by mouth 2 (two) times daily.   DULoxetine  (CYMBALTA ) 30 MG capsule Take 1 capsule (30 mg total) by mouth daily.   gabapentin  (NEURONTIN ) 300 MG capsule TAKE 1 CAPSULE IN THE MORNING AND 2 AT BEDTIME   Misc. Devices [employer] Diagnosis- lower back pain (Patient not taking: Reported on 05/24/2023)   mupirocin  ointment (BACTROBAN ) 2 % Apply 1 Application topically 2 (two) times daily. X 1 week Prn boils (Patient not taking: Reported on 05/24/2023)   mupirocin  ointment (BACTROBAN ) 2 % Place 1 Application into the nose 2 (two) times daily. For 5 days (Patient not taking: Reported on 05/24/2023)    valACYclovir  (VALTREX ) 500 MG tablet TAKE 1 TABLET BY MOUTH TWICE DAILY   No facility-administered encounter medications on file as of 08/16/2023.     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  HENT: Negative for congestion, sore throat, rhinorrhea, sneezing, trouble swallowing and sinus pressure.  Eyes: Negative for photophobia and visual disturbance.  Respiratory: Negative for cough, chest tightness, shortness of breath, wheezing and stridor.  Cardiovascular: Negative for chest pain, palpitations and leg swelling.  Gastrointestinal: Negative for nausea, vomiting, abdominal pain, diarrhea, constipation, blood in stool, abdominal distention and  anal bleeding.  Genitourinary: Negative for dysuria, hematuria, flank pain and difficulty urinating.  Musculoskeletal: Negative for myalgias, back pain, joint swelling, arthralgias and gait problem.  Skin: Negative for color change, pallor, rash and wound.  Neurological: Negative for dizziness, tremors, weakness  and light-headedness.  Hematological: Negative for adenopathy. Does not bruise/bleed easily.  Psychiatric/Behavioral: Negative for behavioral problems, confusion, sleep disturbance, dysphoric mood, decreased concentration and agitation.   Physical Exam   BP 127/81   Pulse (!) 57   Temp 97.9 F (36.6 C) (Oral)   Wt 184 lb (83.5 kg)   SpO2 97%   BMI 24.95 kg/m  Physical Exam  Constitutional: He is oriented to person, place, and time. He appears well-developed and well-nourished. No distress.  HENT:  Mouth/Throat: Oropharynx is clear and moist. No oropharyngeal exudate.  Cardiovascular: Normal rate, regular rhythm and normal heart sounds. Exam reveals no gallop and no friction rub.  No murmur heard.  Pulmonary/Chest: Effort normal and breath sounds normal. No respiratory distress. He has no wheezes.  Abdominal: Soft. Bowel sounds are normal. He exhibits no distension. There is no tenderness.  Lymphadenopathy:  He has no cervical adenopathy.  Neurological: He is alert and oriented to person, place, and time.  Skin: Skin is warm and dry. No rash noted. No erythema.  Psychiatric: He has a normal mood and affect. His behavior is normal.    Lab Results  Component Value Date   CD4TCELL 18 (L) 02/17/2023   Lab Results  Component Value Date   CD4TABS 277 (L) 02/17/2023   CD4TABS 277 (L) 05/13/2022   CD4TABS 196 (L) 01/12/2022   Lab Results  Component Value Date   HIV1RNAQUANT 36 (H) 02/17/2023   No results found for: "HEPBSAB" Lab Results  Component Value Date   LABRPR NON-REACTIVE 02/17/2023    CBC Lab Results  Component Value Date   WBC 6.2 02/17/2023   RBC 5.07 02/17/2023   HGB 15.0 02/17/2023   HCT 47.8 02/17/2023   PLT 278 02/17/2023   MCV 94.3 02/17/2023   MCH 29.6 02/17/2023   MCHC 31.4 (L) 02/17/2023   RDW 12.4 02/17/2023   LYMPHSABS 1,773 02/17/2023   MONOABS 0.6 06/12/2021   EOSABS 316 02/17/2023    BMET Lab Results  Component Value Date   NA 138  02/17/2023   K 4.2 02/17/2023   CL 103 02/17/2023   CO2 28 02/17/2023   GLUCOSE 89 02/17/2023   BUN 8 02/17/2023   CREATININE 0.92 02/17/2023   CALCIUM  9.8 02/17/2023   GFRNONAA >60 06/16/2021      Assessment and Plan  Candidal skin infection = recommend a course of clotrimazole ; wear cotton loose underwear. Change underwear if prespiration  Hiv disease = plan to continue on biktarvy . We Will check hiv labs  Long term medication management = will also check cr to see that it is stable  Health maintenance = will check lipids at next visit in 6 months Declined pneumonia vaccine

## 2023-08-17 LAB — URINE CYTOLOGY ANCILLARY ONLY
Chlamydia: NEGATIVE
Comment: NEGATIVE
Comment: NORMAL
Neisseria Gonorrhea: NEGATIVE

## 2023-08-17 LAB — T-HELPER CELL (CD4) - (RCID CLINIC ONLY)
CD4 % Helper T Cell: 18 % — ABNORMAL LOW (ref 33–65)
CD4 T Cell Abs: 220 /uL — ABNORMAL LOW (ref 400–1790)

## 2023-08-18 ENCOUNTER — Ambulatory Visit: Payer: Medicare HMO | Admitting: Internal Medicine

## 2023-08-19 LAB — LIPID PANEL
Cholesterol: 123 mg/dL (ref <200)
HDL: 49 mg/dL (ref >=40)
LDL Cholesterol (Calc): 60 mg/dL
Non-HDL Cholesterol (Calc): 74 mg/dL (ref <130)
Total CHOL/HDL Ratio: 2.5 (calc) (ref <5.0)
Triglycerides: 68 mg/dL (ref <150)

## 2023-08-19 LAB — COMPLETE METABOLIC PANEL WITHOUT GFR
AG Ratio: 1.5 (calc) (ref 1.0–2.5)
ALT: 17 U/L (ref 9–46)
AST: 19 U/L (ref 10–35)
Albumin: 4.5 g/dL (ref 3.6–5.1)
Alkaline phosphatase (APISO): 62 U/L (ref 35–144)
BUN/Creatinine Ratio: 6 (calc) (ref 6–22)
BUN: 6 mg/dL — ABNORMAL LOW (ref 7–25)
CO2: 30 mmol/L (ref 20–32)
Calcium: 9.5 mg/dL (ref 8.6–10.3)
Chloride: 104 mmol/L (ref 98–110)
Creat: 1.01 mg/dL (ref 0.70–1.30)
Globulin: 3 g/dL (ref 1.9–3.7)
Glucose, Bld: 89 mg/dL (ref 65–99)
Potassium: 4.1 mmol/L (ref 3.5–5.3)
Sodium: 138 mmol/L (ref 135–146)
Total Bilirubin: 0.5 mg/dL (ref 0.2–1.2)
Total Protein: 7.5 g/dL (ref 6.1–8.1)

## 2023-08-19 LAB — CBC WITH DIFFERENTIAL/PLATELET
Absolute Lymphocytes: 1404 {cells}/uL (ref 850–3900)
Absolute Monocytes: 384 {cells}/uL (ref 200–950)
Basophils Absolute: 30 {cells}/uL (ref 0–200)
Basophils Relative: 0.5 %
Eosinophils Absolute: 218 {cells}/uL (ref 15–500)
Eosinophils Relative: 3.7 %
HCT: 49.8 % (ref 38.5–50.0)
Hemoglobin: 15.9 g/dL (ref 13.2–17.1)
MCH: 29.4 pg (ref 27.0–33.0)
MCHC: 31.9 g/dL — ABNORMAL LOW (ref 32.0–36.0)
MCV: 92.2 fL (ref 80.0–100.0)
MPV: 10.6 fL (ref 7.5–12.5)
Monocytes Relative: 6.5 %
Neutro Abs: 3865 {cells}/uL (ref 1500–7800)
Neutrophils Relative %: 65.5 %
Platelets: 300 10*3/uL (ref 140–400)
RBC: 5.4 10*6/uL (ref 4.20–5.80)
RDW: 11.8 % (ref 11.0–15.0)
Total Lymphocyte: 23.8 %
WBC: 5.9 10*3/uL (ref 3.8–10.8)

## 2023-08-19 LAB — HIV-1 RNA QUANT-NO REFLEX-BLD
HIV 1 RNA Quant: NOT DETECTED {copies}/mL
HIV-1 RNA Quant, Log: NOT DETECTED {Log_copies}/mL

## 2023-08-19 LAB — RPR: RPR Ser Ql: NONREACTIVE

## 2023-09-21 ENCOUNTER — Encounter: Payer: Self-pay | Admitting: Family Medicine

## 2023-09-21 ENCOUNTER — Ambulatory Visit: Payer: Medicare HMO | Attending: Family Medicine | Admitting: Family Medicine

## 2023-09-21 VITALS — BP 122/78 | HR 71 | Ht 72.0 in | Wt 174.4 lb

## 2023-09-21 DIAGNOSIS — J302 Other seasonal allergic rhinitis: Secondary | ICD-10-CM | POA: Diagnosis not present

## 2023-09-21 DIAGNOSIS — L0292 Furuncle, unspecified: Secondary | ICD-10-CM | POA: Diagnosis not present

## 2023-09-21 DIAGNOSIS — J439 Emphysema, unspecified: Secondary | ICD-10-CM

## 2023-09-21 DIAGNOSIS — G629 Polyneuropathy, unspecified: Secondary | ICD-10-CM | POA: Diagnosis not present

## 2023-09-21 DIAGNOSIS — G43809 Other migraine, not intractable, without status migrainosus: Secondary | ICD-10-CM | POA: Diagnosis not present

## 2023-09-21 DIAGNOSIS — B20 Human immunodeficiency virus [HIV] disease: Secondary | ICD-10-CM

## 2023-09-21 DIAGNOSIS — R911 Solitary pulmonary nodule: Secondary | ICD-10-CM | POA: Diagnosis not present

## 2023-09-21 MED ORDER — FLUTICASONE PROPIONATE 50 MCG/ACT NA SUSP: 2.0000 | Freq: Every day | NASAL | 1 refills | Status: DC

## 2023-09-21 MED ORDER — ALBUTEROL SULFATE HFA 108 (90 BASE) MCG/ACT IN AERS
2.0000 | INHALATION_SPRAY | Freq: Four times a day (QID) | RESPIRATORY_TRACT | 2 refills | Status: AC | PRN
Start: 2023-09-21 — End: ?

## 2023-09-21 MED ORDER — DULOXETINE HCL 30 MG PO CPEP: 30.0000 mg | ORAL_CAPSULE | Freq: Every day | ORAL | 1 refills | Status: DC

## 2023-09-21 MED ORDER — DOXYCYCLINE HYCLATE 100 MG PO TABS: 100.0000 mg | ORAL_TABLET | Freq: Two times a day (BID) | ORAL | 0 refills | Status: DC

## 2023-09-21 MED ORDER — RIZATRIPTAN BENZOATE 10 MG PO TABS: 10.0000 mg | ORAL_TABLET | ORAL | 1 refills | Status: DC | PRN

## 2023-09-21 MED ORDER — LEVOCETIRIZINE DIHYDROCHLORIDE 5 MG PO TABS: 5.0000 mg | ORAL_TABLET | Freq: Every evening | ORAL | 1 refills | Status: DC

## 2023-09-21 MED ORDER — CHLORHEXIDINE GLUCONATE 4 % EX SOLN: Freq: Every day | CUTANEOUS | 1 refills | Status: DC

## 2023-09-21 NOTE — Patient Instructions (Signed)
 VISIT SUMMARY:  During your visit, we discussed several health concerns including recurrent boils, emphysema, tobacco use, a lung nodule, migraine headaches, neuropathy, and allergic rhinitis. We reviewed your symptoms, current medications, and developed a plan to address each issue.  YOUR PLAN:  -RECURRENT BOILS: Recurrent boils, possibly hidradenitis suppurativa, are painful lumps under the skin that can become infected. We will start you on doxycycline  to control the infection and refer you to a dermatologist for further evaluation. Please also perform bleach baths to reduce bacteria on your skin.  -EMPHYSEMA: Emphysema is a lung condition that causes shortness of breath. We prescribed an inhaler to help with your symptoms and strongly encourage you to quit smoking to prevent further lung damage.  -TOBACCO USE DISORDER: Continued smoking is harmful to your lungs and overall health. We discussed the importance of quitting smoking and will continue to support you in your cessation efforts.  -LUNG NODULE: A lung nodule is a small growth in the lung that needs to be monitored, especially given your smoking history. We have ordered a CT chest scan to follow up on this nodule.  -MIGRAINE HEADACHES: Migraines are severe headaches often accompanied by nausea and sensitivity to light. We prescribed Maxalt to take at the onset of a migraine and again in two hours if needed.  -NEUROPATHY: Neuropathy is nerve pain that you have been managing with Cymbalta . We will discontinue gabapentin  due to its sedative effects and continue with Cymbalta .  -ALLERGIC RHINITIS: Allergic rhinitis is inflammation of the nasal passages causing a runny nose and congestion. We prescribed Xyzal and Flonase nasal spray to help manage your allergy symptoms.  INSTRUCTIONS:  Please follow up with the dermatologist as referred for your recurrent boils. Schedule a CT chest scan for your lung nodule follow-up. Continue to work on  smoking cessation and use the prescribed inhaler for emphysema symptoms. Take Maxalt at the onset of migraine headaches and repeat in two hours if needed. Use Xyzal and Flonase as directed for allergy management.

## 2023-09-21 NOTE — Progress Notes (Signed)
 Subjective:  Patient ID: Mason Moran, male    DOB: 03-04-1971  Age: 53 y.o. MRN: [id number]  CC: Medical Management of Chronic Issues (Headaches/Allergies/Boils under arms and neck)     Discussed the use of AI scribe software for clinical note transcription with the patient, who gave verbal consent to proceed.  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  He experiences painful boils with 'white cores' primarily under his arms and chin, occasionally appearing on his thighs.  He has requested prescriptions for antibiotics on several occasions.  At the moment none of the lesions are draining.  He has not used bleach baths or Hibiclens  as previously prescribed due to skin dryness and has previously been prescribed antibiotics.  He experiences headaches between 1 and 3 PM, primarily on the right side, with photophobia in the right eye. These occur approximately once every other week, with the most recent episode last Thursday. The headaches are severe, requiring him to lie down, with associated gagging but no emesis.  He has allergies with rhinorrhea, managed with steam showers. Claritin has been ineffective.  He has a smoking history and experiences dyspnea upon waking, attributing it to smoking. He uses bupropion  for smoking cessation but continues to smoke. CT chest for lung cancer screening from 03/2023 revealed: IMPRESSION: 1. Lung-RADS 3, probably benign findings. Short-term follow-up in 6 months is recommended with repeat low-dose chest CT without contrast (please use the following order, "CT CHEST LCS NODULE FOLLOW-UP W/O CM"). Solid 5.3 mm medial left lower lobe pulmonary nodule is new from 06/13/2021 chest CT. 2.  Emphysema (ICD10-J43.9).  His  current medications include gabapentin  and Cymbalta  for neuropathy. Gabapentin  causes somnolence, so he takes it at night. He wishes to discontinue gabapentin  but continue Cymbalta .    Past Medical History:  Diagnosis Date   HIV (human immunodeficiency virus infection) (HCC)     No past surgical history on file.  No family history on file.  Social History   Socioeconomic History   Marital status: Single    Spouse name: Not on file   Number of children: Not on file   Years of education: Not on file   Highest education level: Not on file  Occupational History   Not on file  Tobacco Use   Smoking status: Every Day    Current packs/day: 0.50    Types: Cigarettes   Smokeless tobacco: Never  Vaping Use   Vaping status: Never Used  Substance and Sexual Activity   Alcohol use: Not Currently    Comment: none in a month   Drug use: Yes    Types: Marijuana    Comment: daily   Sexual activity: Not on file    Comment: declined condoms  Other Topics Concern   Not on file  Social History Narrative   Not on file   Social Drivers of Health   Financial Resource Strain: Low Risk  (05/24/2023)   Overall Financial Resource Strain (CARDIA)    Difficulty of Paying Living Expenses: Not very hard  Food Insecurity: No Food Insecurity (05/24/2023)   Hunger Vital Sign    Worried About Running Out of Food in the Last Year: Never true    Ran Out of Food in the Last Year: Never true  Transportation Needs:  No Transportation Needs (05/24/2023)   PRAPARE - Administrator, Civil Service (Medical): No    Lack of Transportation (Non-Medical): No  Physical Activity: Sufficiently Active (05/24/2023)   Exercise Vital Sign    Days of Exercise per Week: 7 days    Minutes of Exercise per Session: 30 min  Stress: No Stress Concern Present (05/24/2023)   [employer] of Occupational Health - Occupational Stress Questionnaire    Feeling of Stress : Not at all  Social Connections:  Moderately Integrated (05/24/2023)   Social Connection and Isolation Panel [NHANES]    Frequency of Communication with Friends and Family: Twice a week    Frequency of Social Gatherings with Friends and Family: More than three times a week    Attends Religious Services: 1 to 4 times per year    Active Member of [employer] or Organizations: Yes    Attends Engineer, structural: More than 4 times per year    Marital Status: Never married    Allergies  Allergen Reactions   Dapsone Other (See Comments)    Other reaction(s): Other (See Comments) G6PD deficiency   Other Other (See Comments)    Seafood - can't speak   Primaquine     Other reaction(s): Other (See Comments) G6PD deficiency    Outpatient Medications Prior to Visit  Medication Sig Dispense Refill   baclofen  (LIORESAL ) 10 MG tablet TAKE 1 TABLET(10 MG) BY MOUTH TWICE DAILY AS NEEDED FOR MUSCLE SPASMS 60 tablet 3   bictegravir-emtricitabine -tenofovir  AF (BIKTARVY ) 50-200-25 MG TABS tablet Take 1 tablet by mouth daily. 30 tablet 11   buPROPion  (WELLBUTRIN  XL) 150 MG 24 hr tablet Take 1 tablet (150 mg total) by mouth daily. For smoking cessation 90 tablet 1   clotrimazole  (LOTRIMIN ) 1 % cream Apply 1 Application topically 2 (two) times daily. 85 g 3   Misc. Devices [employer] Diagnosis- lower back pain (Patient not taking: Reported on 08/16/2023) 1 each 0   valACYclovir  (VALTREX ) 500 MG tablet TAKE 1 TABLET BY MOUTH TWICE DAILY (Patient not taking: Reported on 08/16/2023) 60 tablet 1   chlorhexidine  (HIBICLENS ) 4 % external liquid Apply topically daily. For 5 days (Patient not taking: Reported on 08/16/2023) 473 mL 1   clindamycin  (CLEOCIN ) 300 MG capsule Take 1 capsule (300 mg total) by mouth 3 (three) times daily. (Patient not taking: Reported on 08/16/2023) 21 capsule 0   doxycycline  (VIBRA -TABS) 100 MG tablet Take 1 tablet (100 mg total) by mouth 2 (two) times daily. (Patient not taking: Reported on 08/16/2023) 14 tablet 0   DULoxetine   (CYMBALTA ) 30 MG capsule Take 1 capsule (30 mg total) by mouth daily. 90 capsule 1   gabapentin  (NEURONTIN ) 300 MG capsule TAKE 1 CAPSULE IN THE MORNING AND 2 AT BEDTIME 270 capsule 1   mupirocin  ointment (BACTROBAN ) 2 % Apply 1 Application topically 2 (two) times daily. X 1 week Prn boils (Patient not taking: Reported on 08/16/2023) 30 g 2   mupirocin  ointment (BACTROBAN ) 2 % Place 1 Application into the nose 2 (two) times daily. For 5 days (Patient not taking: Reported on 08/16/2023) 22 g 1   No facility-administered medications prior to visit.     ROS Review of Systems  Constitutional:  Negative for activity change and appetite change.  HENT:  Positive for sneezing. Negative for sinus pressure and sore throat.   Eyes:  Positive for visual disturbance.  Respiratory:  Negative for chest tightness, shortness of breath and wheezing.   Cardiovascular:  Negative for chest pain and palpitations.  Gastrointestinal:  Negative for abdominal distention, abdominal pain and constipation.  Genitourinary: Negative.   Musculoskeletal: Negative.   Skin:  Positive for rash.  Neurological:  Positive for headaches.  Psychiatric/Behavioral:  Negative for behavioral problems and dysphoric mood.     Objective:  BP 122/78   Pulse 71   Ht 6' (1.829 m)   Wt 174 lb 6.4 oz (79.1 kg)   SpO2 99%   BMI 23.65 kg/m      09/21/2023    8:43 AM 08/16/2023   10:32 AM 05/24/2023    2:04 PM  BP/Weight  Systolic BP 122 127 119  Diastolic BP 78 81 78  Wt. (Lbs) 174.4 184 181.4  BMI 23.65 kg/m2 24.95 kg/m2 24.6 kg/m2      Physical Exam Constitutional:      Appearance: He is well-developed.  Cardiovascular:     Rate and Rhythm: Normal rate.     Heart sounds: Normal heart sounds. No murmur heard. Pulmonary:     Effort: Pulmonary effort is normal.     Breath sounds: Normal breath sounds. No wheezing or rales.  Chest:     Chest wall: No tenderness.  Abdominal:     General: Bowel sounds are normal. There is  no distension.     Palpations: Abdomen is soft. There is no mass.     Tenderness: There is no abdominal tenderness.  Musculoskeletal:        General: Normal range of motion.     Right lower leg: No edema.     Left lower leg: No edema.  Skin:    Comments: Boils underneath chin and in right axilla with no drainage, not tender  Neurological:     Mental Status: He is alert and oriented to person, place, and time.  Psychiatric:        Mood and Affect: Mood normal.        Latest Ref Rng & Units 08/16/2023   11:03 AM 02/17/2023   11:32 PM 05/13/2022    9:22 AM  CMP  Glucose 65 - 99 mg/dL 89  89  68   BUN 7 - 25 mg/dL 6  8  8    Creatinine 0.70 - 1.30 mg/dL 1.61  0.96  0.45   Sodium 135 - 146 mmol/L 138  138  142   Potassium 3.5 - 5.3 mmol/L 4.1  4.2  4.3   Chloride 98 - 110 mmol/L 104  103  106   CO2 20 - 32 mmol/L 30  28  29    Calcium  8.6 - 10.3 mg/dL 9.5  9.8  9.5   Total Protein 6.1 - 8.1 g/dL 7.5  7.8  8.0   Total Bilirubin 0.2 - 1.2 mg/dL 0.5  0.6  0.6   AST 10 - 35 U/L 19  16  14    ALT 9 - 46 U/L 17  12  14      Lipid Panel     Component Value Date/Time   CHOL 123 08/16/2023 1103   TRIG 68 08/16/2023 1103   HDL 49 08/16/2023 1103   CHOLHDL 2.5 08/16/2023 1103   LDLCALC 60 08/16/2023 1103    CBC    Component Value Date/Time   WBC 5.9 08/16/2023 1103   RBC 5.40 08/16/2023 1103   HGB 15.9 08/16/2023 1103   HGB 12.3 (L) 07/21/2021 1531   HCT 49.8 08/16/2023 1103   HCT 36.9 (L) 07/21/2021 1531   PLT 300 08/16/2023 1103  PLT 277 07/21/2021 1531   MCV 92.2 08/16/2023 1103   MCV 90 07/21/2021 1531   MCH 29.4 08/16/2023 1103   MCHC 31.9 (L) 08/16/2023 1103   RDW 11.8 08/16/2023 1103   RDW 17.7 (H) 07/21/2021 1531   LYMPHSABS 1,773 02/17/2023 2332   LYMPHSABS 1.7 07/21/2021 1531   MONOABS 0.6 06/12/2021 1130   EOSABS 218 08/16/2023 1103   EOSABS 1.1 (H) 07/21/2021 1531   BASOSABS 30 08/16/2023 1103   BASOSABS 0.0 07/21/2021 1531    Lab Results  Component Value  Date   HGBA1C 4.8 07/21/2021       Assessment & Plan Recurrent Boils Recurrent boils under arms and chin, possibly hidradenitis suppurativa. Previous treatment adherence issues. Dermatology referral needed. Discussed careful antibiotic use. - Prescribe doxycycline  for infection control. - Refer to dermatology for further evaluation and management. - Instruct to perform bleach baths to reduce bacterial load.  Emphysema Emphysema with nocturnal dyspnea, likely exacerbated by smoking. Emphasized smoking cessation and inhaler use. - Prescribe inhaler for symptomatic relief; he is declining initiation of inhalers - Encourage smoking cessation.  Tobacco Use Disorder Continued tobacco use despite cessation attempts. Smoking exacerbates emphysema and increases lung cancer risk. Discussed cessation importance and previous bupropion  use. - Encourage smoking cessation and discuss potential strategies.  Lung Nodule Previously identified lung nodule requiring follow-up due to smoking history and lung cancer risk. - Order CT chest scan for nodule follow-up.  Migraine Headaches New onset migraines biweekly with photophobia and gagging. Discussed potential triggers and management strategies. - Prescribe Maxalt for acute migraine management, to be taken at onset and repeated in two hours if needed.  Neuropathy Neuropathy managed with Cymbalta . Gabapentin  discontinued due to sedation. - Discontinue gabapentin  due to sedation. - Continue Cymbalta  for neuropathic pain management.  Allergic Rhinitis Runny nose and nasal congestion not relieved by Claritin. Discussed alternative treatments. - Prescribe Xyzal for allergy management. - Prescribe Flonase nasal spray for additional symptom control.       Meds ordered this encounter  Medications   chlorhexidine  (HIBICLENS ) 4 % external liquid    Sig: Apply topically daily. For 5 days    Dispense:  473 mL    Refill:  1    For decolonization    doxycycline  (VIBRA -TABS) 100 MG tablet    Sig: Take 1 tablet (100 mg total) by mouth 2 (two) times daily.    Dispense:  14 tablet    Refill:  0   DULoxetine  (CYMBALTA ) 30 MG capsule    Sig: Take 1 capsule (30 mg total) by mouth daily.    Dispense:  90 capsule    Refill:  1   fluticasone (FLONASE) 50 MCG/ACT nasal spray    Sig: Place 2 sprays into both nostrils daily.    Dispense:  16 g    Refill:  1   levocetirizine (XYZAL) 5 MG tablet    Sig: Take 1 tablet (5 mg total) by mouth every evening.    Dispense:  30 tablet    Refill:  1   rizatriptan (MAXALT) 10 MG tablet    Sig: Take 1 tablet (10 mg total) by mouth as needed for migraine. May repeat in 2 hours if needed    Dispense:  10 tablet    Refill:  1   albuterol (VENTOLIN HFA) 108 (90 Base) MCG/ACT inhaler    Sig: Inhale 2 puffs into the lungs every 6 (six) hours as needed for wheezing or shortness of breath.    Dispense:  8 g    Refill:  2    Follow-up: Return in about 6 months (around 03/23/2024) for Chronic medical conditions.       Joaquin Mulberry, MD, FAAFP. [facility] [place], [place] [phone]   09/21/2023, 12:19 PM

## 2023-10-13 ENCOUNTER — Ambulatory Visit

## 2023-10-13 DIAGNOSIS — L0292 Furuncle, unspecified: Secondary | ICD-10-CM | POA: Diagnosis not present

## 2023-12-07 ENCOUNTER — Ambulatory Visit: Admitting: Internal Medicine

## 2023-12-07 ENCOUNTER — Encounter: Payer: Self-pay | Admitting: Internal Medicine

## 2023-12-07 ENCOUNTER — Other Ambulatory Visit: Payer: Self-pay

## 2023-12-07 ENCOUNTER — Other Ambulatory Visit (HOSPITAL_COMMUNITY)
Admission: RE | Admit: 2023-12-07 | Discharge: 2023-12-07 | Disposition: A | Discharge status: Home / Self Care | Source: Ambulatory Visit | Attending: Internal Medicine | Admitting: Internal Medicine

## 2023-12-07 VITALS — BP 133/73 | HR 82 | Temp 97.9°F | Ht 72.0 in | Wt 171.0 lb

## 2023-12-07 DIAGNOSIS — L0292 Furuncle, unspecified: Secondary | ICD-10-CM | POA: Diagnosis not present

## 2023-12-07 DIAGNOSIS — Z113 Encounter for screening for infections with a predominantly sexual mode of transmission: Secondary | ICD-10-CM

## 2023-12-07 DIAGNOSIS — Z79899 Other long term (current) drug therapy: Secondary | ICD-10-CM | POA: Diagnosis not present

## 2023-12-07 DIAGNOSIS — B20 Human immunodeficiency virus [HIV] disease: Secondary | ICD-10-CM | POA: Insufficient documentation

## 2023-12-07 MED ORDER — DOXYCYCLINE HYCLATE 100 MG PO TABS: 100.0000 mg | ORAL_TABLET | Freq: Two times a day (BID) | ORAL | 0 refills | Status: DC
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
## 2023-12-08 LAB — URINE CYTOLOGY ANCILLARY ONLY
Chlamydia: NEGATIVE
Comment: NEGATIVE
Comment: NORMAL
Neisseria Gonorrhea: NEGATIVE

## 2023-12-09 DIAGNOSIS — H40013 Open angle with borderline findings, low risk, bilateral: Secondary | ICD-10-CM | POA: Diagnosis not present

## 2023-12-09 DIAGNOSIS — H40053 Ocular hypertension, bilateral: Secondary | ICD-10-CM | POA: Diagnosis not present

## 2023-12-09 LAB — CBC WITH DIFFERENTIAL/PLATELET
Absolute Lymphocytes: 1480 {cells}/uL (ref 850–3900)
Absolute Monocytes: 380 {cells}/uL (ref 200–950)
Basophils Absolute: 39 {cells}/uL (ref 0–200)
Basophils Relative: 0.7 %
Eosinophils Absolute: 297 {cells}/uL (ref 15–500)
Eosinophils Relative: 5.4 %
HCT: 48.1 % (ref 38.5–50.0)
Hemoglobin: 15.2 g/dL (ref 13.2–17.1)
MCH: 29.9 pg (ref 27.0–33.0)
MCHC: 31.6 g/dL — ABNORMAL LOW (ref 32.0–36.0)
MCV: 94.7 fL (ref 80.0–100.0)
MPV: 10.5 fL (ref 7.5–12.5)
Monocytes Relative: 6.9 %
Neutro Abs: 3306 {cells}/uL (ref 1500–7800)
Neutrophils Relative %: 60.1 %
Platelets: 248 Thousand/uL (ref 140–400)
RBC: 5.08 Million/uL (ref 4.20–5.80)
RDW: 12 % (ref 11.0–15.0)
Total Lymphocyte: 26.9 %
WBC: 5.5 Thousand/uL (ref 3.8–10.8)

## 2023-12-09 LAB — COMPREHENSIVE METABOLIC PANEL WITH GFR
AG Ratio: 1.3 (calc) (ref 1.0–2.5)
ALT: 16 U/L (ref 9–46)
AST: 22 U/L (ref 10–35)
Albumin: 4.3 g/dL (ref 3.6–5.1)
Alkaline phosphatase (APISO): 57 U/L (ref 35–144)
BUN: 14 mg/dL (ref 7–25)
CO2: 26 mmol/L (ref 20–32)
Calcium: 9.4 mg/dL (ref 8.6–10.3)
Chloride: 104 mmol/L (ref 98–110)
Creat: 1.03 mg/dL (ref 0.70–1.30)
Globulin: 3.3 g/dL (ref 1.9–3.7)
Glucose, Bld: 95 mg/dL (ref 65–99)
Potassium: 4.4 mmol/L (ref 3.5–5.3)
Sodium: 136 mmol/L (ref 135–146)
Total Bilirubin: 0.6 mg/dL (ref 0.2–1.2)
Total Protein: 7.6 g/dL (ref 6.1–8.1)
eGFR: 86 mL/min/1.73m2 (ref >=60)

## 2023-12-09 LAB — RPR: RPR Ser Ql: NONREACTIVE

## 2023-12-09 LAB — HIV-1 RNA QUANT-NO REFLEX-BLD
HIV 1 RNA Quant: NOT DETECTED {copies}/mL
HIV-1 RNA Quant, Log: NOT DETECTED {Log_copies}/mL

## 2023-12-09 LAB — T-HELPER CELLS (CD4) COUNT (NOT AT ARMC)
CD4 % Helper T Cell: 19 % — ABNORMAL LOW (ref 33–65)
CD4 T Cell Abs: 263 /uL — ABNORMAL LOW (ref 400–1790)

## 2023-12-12 ENCOUNTER — Emergency Department (HOSPITAL_BASED_OUTPATIENT_CLINIC_OR_DEPARTMENT_OTHER)
Admission: EM | Admit: 2023-12-12 | Discharge: 2023-12-12 | Disposition: A | Discharge status: Home / Self Care | Attending: Emergency Medicine | Admitting: Emergency Medicine

## 2023-12-12 ENCOUNTER — Emergency Department (HOSPITAL_BASED_OUTPATIENT_CLINIC_OR_DEPARTMENT_OTHER)

## 2023-12-12 ENCOUNTER — Encounter (HOSPITAL_BASED_OUTPATIENT_CLINIC_OR_DEPARTMENT_OTHER): Payer: Self-pay

## 2023-12-12 ENCOUNTER — Other Ambulatory Visit: Payer: Self-pay

## 2023-12-12 ENCOUNTER — Emergency Department (HOSPITAL_COMMUNITY)

## 2023-12-12 DIAGNOSIS — Z21 Asymptomatic human immunodeficiency virus [HIV] infection status: Secondary | ICD-10-CM | POA: Diagnosis not present

## 2023-12-12 DIAGNOSIS — M19011 Primary osteoarthritis, right shoulder: Secondary | ICD-10-CM | POA: Diagnosis not present

## 2023-12-12 DIAGNOSIS — M62838 Other muscle spasm: Secondary | ICD-10-CM | POA: Diagnosis not present

## 2023-12-12 DIAGNOSIS — M25512 Pain in left shoulder: Secondary | ICD-10-CM

## 2023-12-12 DIAGNOSIS — Y92410 Unspecified street and highway as the place of occurrence of the external cause: Secondary | ICD-10-CM | POA: Diagnosis not present

## 2023-12-12 DIAGNOSIS — M25511 Pain in right shoulder: Secondary | ICD-10-CM | POA: Diagnosis not present
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
## 2023-12-12 NOTE — Discharge Instructions (Addendum)
 You came in because you were having stiffness and spasms of your right shoulder. We gave you something to help you with the pain and also took some imaging of your shoulder. This x ray did not show any signs of fracture and just showed some age related changes. We recommend that you follow up with your primary care doctor for any worsening shoulder pain. In the mean time you can use some over the counter voltaren gel, lidocaine patches to help with the pain as well. You can also take ibuprofen  as needed as well.

## 2023-12-12 NOTE — ED Notes (Signed)
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
## 2024-01-12 DIAGNOSIS — H40053 Ocular hypertension, bilateral: Secondary | ICD-10-CM | POA: Diagnosis not present

## 2024-01-12 DIAGNOSIS — H40013 Open angle with borderline findings, low risk, bilateral: Secondary | ICD-10-CM | POA: Diagnosis not present

## 2024-01-16 DIAGNOSIS — L0292 Furuncle, unspecified: Secondary | ICD-10-CM | POA: Diagnosis not present

## 2024-01-16 DIAGNOSIS — L853 Xerosis cutis: Secondary | ICD-10-CM | POA: Diagnosis not present

## 2024-02-13 ENCOUNTER — Ambulatory Visit: Payer: Self-pay

## 2024-02-13 ENCOUNTER — Emergency Department (HOSPITAL_BASED_OUTPATIENT_CLINIC_OR_DEPARTMENT_OTHER)

## 2024-02-13 ENCOUNTER — Encounter (HOSPITAL_BASED_OUTPATIENT_CLINIC_OR_DEPARTMENT_OTHER): Payer: Self-pay

## 2024-02-13 ENCOUNTER — Emergency Department (HOSPITAL_BASED_OUTPATIENT_CLINIC_OR_DEPARTMENT_OTHER)
Admission: EM | Admit: 2024-02-13 | Discharge: 2024-02-13 | Disposition: A | Discharge status: Home / Self Care | Attending: Emergency Medicine | Admitting: Emergency Medicine

## 2024-02-13 ENCOUNTER — Other Ambulatory Visit: Payer: Self-pay

## 2024-02-13 DIAGNOSIS — S199XXA Unspecified injury of neck, initial encounter: Secondary | ICD-10-CM | POA: Diagnosis not present

## 2024-02-13 DIAGNOSIS — I771 Stricture of artery: Secondary | ICD-10-CM | POA: Diagnosis not present

## 2024-02-13 DIAGNOSIS — E11649 Type 2 diabetes mellitus with hypoglycemia without coma: Secondary | ICD-10-CM | POA: Diagnosis not present

## 2024-02-13 DIAGNOSIS — E162 Hypoglycemia, unspecified: Secondary | ICD-10-CM | POA: Diagnosis not present

## 2024-02-13 DIAGNOSIS — M47812 Spondylosis without myelopathy or radiculopathy, cervical region: Secondary | ICD-10-CM | POA: Diagnosis not present

## 2024-02-13 DIAGNOSIS — Z21 Asymptomatic human immunodeficiency virus [HIV] infection status: Secondary | ICD-10-CM | POA: Diagnosis not present

## 2024-02-13 DIAGNOSIS — M542 Cervicalgia: Secondary | ICD-10-CM | POA: Diagnosis not present

## 2024-02-13 DIAGNOSIS — L299 Pruritus, unspecified: Secondary | ICD-10-CM | POA: Diagnosis not present

## 2024-02-13 DIAGNOSIS — R55 Syncope and collapse: Secondary | ICD-10-CM | POA: Diagnosis not present

## 2024-02-13 DIAGNOSIS — N481 Balanitis: Secondary | ICD-10-CM | POA: Insufficient documentation

## 2024-02-13 DIAGNOSIS — M4802 Spinal stenosis, cervical region: Secondary | ICD-10-CM | POA: Diagnosis not present

## 2024-02-13 LAB — CBC WITH DIFFERENTIAL/PLATELET
Abs Immature Granulocytes: 0.03 K/uL (ref 0.00–0.07)
Basophils Absolute: 0 K/uL (ref 0.0–0.1)
Basophils Relative: 1 %
Eosinophils Absolute: 0.3 K/uL (ref 0.0–0.5)
Eosinophils Relative: 6 %
HCT: 41.1 % (ref 39.0–52.0)
Hemoglobin: 13.5 g/dL (ref 13.0–17.0)
Immature Granulocytes: 1 %
Lymphocytes Relative: 25 %
Lymphs Abs: 1.3 K/uL (ref 0.7–4.0)
MCH: 30.1 pg (ref 26.0–34.0)
MCHC: 32.8 g/dL (ref 30.0–36.0)
MCV: 91.7 fL (ref 80.0–100.0)
Monocytes Absolute: 0.4 K/uL (ref 0.1–1.0)
Monocytes Relative: 7 %
Neutro Abs: 3.3 K/uL (ref 1.7–7.7)
Neutrophils Relative %: 60 %
Platelets: 221 K/uL (ref 150–400)
RBC: 4.48 MIL/uL (ref 4.22–5.81)
RDW: 12.9 % (ref 11.5–15.5)
WBC: 5.4 K/uL (ref 4.0–10.5)
nRBC: 0 % (ref 0.0–0.2)

## 2024-02-13 LAB — BASIC METABOLIC PANEL WITH GFR
Anion gap: 10 (ref 5–15)
BUN: 8 mg/dL (ref 6–20)
CO2: 26 mmol/L (ref 22–32)
Calcium: 9 mg/dL (ref 8.9–10.3)
Chloride: 105 mmol/L (ref 98–111)
Creatinine, Ser: 0.95 mg/dL (ref 0.61–1.24)
GFR, Estimated: >60 mL/min (ref >60)
Glucose, Bld: 61 mg/dL — ABNORMAL LOW (ref 70–99)
Potassium: 3.9 mmol/L (ref 3.5–5.1)
Sodium: 141 mmol/L (ref 135–145)

## 2024-02-13 LAB — CBG MONITORING, ED: Glucose-Capillary: 90 mg/dL (ref 70–99)

## 2024-02-13 LAB — MAGNESIUM: Magnesium: 2 mg/dL (ref 1.7–2.4)

## 2024-02-13 LAB — TROPONIN T, HIGH SENSITIVITY: Troponin T High Sensitivity: <15 ng/L (ref 0–19)

## 2024-02-13 MED ORDER — SODIUM CHLORIDE 0.9 % IV BOLUS
500.0000 mL | Freq: Once | INTRAVENOUS | Status: AC
  Administered 2024-02-13: 500 mL via INTRAVENOUS

## 2024-02-13 MED ORDER — CLOTRIMAZOLE 1 % EX CREA: TOPICAL_CREAM | CUTANEOUS | 0 refills | Status: DC

## 2024-02-13 MED ORDER — IOHEXOL 350 MG/ML SOLN
100.0000 mL | Freq: Once | INTRAVENOUS | Status: AC | PRN
Start: 2024-02-13 — End: 2024-02-13
  Administered 2024-02-13: 75 mL via INTRAVENOUS

## 2024-02-13 NOTE — Discharge Instructions (Addendum)
 The episodes of passing out are likely due to a drop in your blood pressure when you changed positions.  Please continue to keep well-hydrated at home with water, your urine should be a light yellow color. Move slowly when changing positions.  You had a low blood sugar upon arrival to the ER.  Please continue to eat regular meals throughout the day and snack when you feel hungry.  This can cause you to feel dizzy if your blood sugar drops.  Your other labs including your electrolytes, blood counts, kidney function were normal today.  Your cardiac enzyme (troponin) was normal today. Your EKG which is a measure of the heart's electrical activity and rhythm is normal today. These would both show abnormalities if you were having a heart attack  The CT imaging of your head and neck did not show any brain bleeds or fractures from your fall.  It does show some degenerative changes of the spine which occur with age.  You may take up to 1000mg  of tylenol  every 6 hours as needed for pain.  Do not take more then 4g per day. You may use up to 600mg  ibuprofen  every 6 hours as needed for pain.  Do not exceed 2.4g of ibuprofen  per day.  Please apply heating packs to your neck to help with neck pain and stiffness.   Based on your exam, appears you have a yeast infection on your penis.  You have been prescribed clotrimazole  cream to apply to your penis twice daily for the next 14 days to help this infection.  Please follow-up with your PCP if your symptoms do not start to improve within the next 2 weeks  Please return to the ER for further episodes of passing out, chest pain, shortness of breath, any other new or concerning symptoms

## 2024-02-13 NOTE — ED Triage Notes (Addendum)
 States he had intercourse last week then the next day became dizzy and had syncopal episode x 2. Also c/o genital itching and burning.

## 2024-02-13 NOTE — Telephone Encounter (Signed)
 FYI Only or Action Required?: FYI only for provider.  Patient was last seen in primary care on 09/21/2023 by Newlin, Enobong, MD.  Called Nurse Triage reporting Loss of Consciousness and Dysuria.  Symptoms began several days ago.  Interventions attempted: Rest, hydration, or home remedies.  Symptoms are: unchanged.  Triage Disposition: Go to ED Now (or PCP Triage)  Patient/caregiver understands and will follow disposition?:    Copied from CRM [id number]. Topic: Clinical - Red Word Triage >> Feb 13, 2024  9:30 AM Wess RAMAN wrote: Red Word that prompted transfer to Nurse Triage: Patient has been having blackouts and fell on Wednesday and Saturday. Patient stated he was walking and felt dizzy and then blacked out.  Burning sensation during urination. Reason for Disposition  [1] Age > 50 years AND [2] now alert and feels fine  All other males with painful urination  [1] MODERATE dizziness (e.g., interferes with normal activities) AND [2] has NOT been evaluated by doctor (or NP/PA) for this  (Exception: Dizziness caused by heat exposure, sudden standing, or poor fluid intake.)  Answer Assessment - Initial Assessment Questions Additional info: Patient is already in hospital parking lot during this call.    1. ONSET: How long were you unconscious? (e.g., minutes, seconds) When did it happen?     unsure 2. CONTENT: What happened during the period of unconsciousness? (e.g., seizure activity)      unsure 3. MENTAL STATUS: Alert and oriented now? (e.g., oriented x 3 = name, month, location)      A&O 4. TRIGGER: What do you think caused the fainting? What were you doing just before you fainted?  (e.g., exercise, sudden standing up, prolonged standing)     Wednesday while mowing law, Saturday lightheaded fainted 5. RECURRENT SYMPTOM: Have you ever passed out before? If Yes, ask: When was the last time? and What happened that time?      Wednesday and Saturday  6. INJURY:  Did you hurt yourself when you fell?      No, now has stiff neck 7. CARDIAC SYMPTOMS: Have you had any of the following symptoms: chest pain, difficulty breathing, palpitations?     no 8. NEUROLOGIC SYMPTOMS: Have you had any of the following symptoms: headache, numbness, vertigo, weakness?     lightheaded 9. GI SYMPTOMS: Have you had any of the following symptoms: abdomen pain, vomiting, diarrhea, blood in stools?     no 10. OTHER SYMPTOMS: Do you have any other symptoms?       dysuria  Answer Assessment - Initial Assessment Questions 1. SEVERITY: How bad is the pain?  (e.g., Scale 1-10; mild, moderate, or severe)     burning 2. FREQUENCY: How many times have you had painful urination today?      unsure 3. PATTERN: Is pain present every time you urinate or just sometimes?      each 4. ONSET: When did the painful urination start?      Over weekend  5. FEVER: Do you have a fever? If Yes, ask: What is your temperature, how was it measured, and when did it start?     Denies  6. PAST UTI: Have you had a urine infection before? If Yes, ask: When was the last time? and What happened that time?      denies 7. CAUSE: What do you think is causing the painful urination?      sex 8. OTHER SYMPTOMS: Do you have any other symptoms? (e.g., flank pain, penis discharge, scrotal pain,  blood in urine)     Dizziness, syncope Wednesday and Saturday I'm not drinking  Protocols used: Urination Pain - Male-A-AH, Dizziness - Lightheadedness-A-AH, Fainting-A-AH

## 2024-02-13 NOTE — ED Notes (Signed)
 Pt reports genital itchiness sp intercourse 1 week ago. Pt states this is not a new partner and the relationship is monogamous. Denies penile discharge/fever. Pt states that the next day, while doing yard work, he bent over and stood back up quickly which lead to a syncopal episode. Pt states he has been intermittently dizzy ever since. Pt states that, over the past week, he has intentionally been limiting his PO fluid intake and taking amoxicillin  and doxycycline  in order to help him pass a drug test.

## 2024-02-13 NOTE — ED Provider Notes (Signed)
 [facility] Provider Note   CSN: [id number] Arrival date & time: 02/13/24  0945     Patient presents with: Loss of Consciousness   Mason Moran is a 53 y.o. male with history of HIV on Biktarvy , presents with concern for 2 episodes of syncope last week.  Reports during both of these episodes, he had bent down to reach for something, stood up fast, and then started to get lightheaded and felt flushed.  He did hit his head during the syncopal episodes.  Denies any headaches, vision changes, nausea or vomiting since.  Denies any chest pain or shortness of breath during these episodes. No dizziness at rest. He reports he has not been drinking much water and has not been eating well, and thinks this may have contributed to by his syncopal episodes.  He also reports itching to the glans of his penis for the past week or so.  He denies any penile discharge, lesions, testicular pain, scrotal swelling, abdominal pain, dysuria, hematuria, or increased frequency.  He does report sexually active with 1 male partner.  No known exposure to STIs.    Loss of Consciousness      Prior to Admission medications   Medication Sig Start Date End Date Taking? Authorizing Provider  clotrimazole  (LOTRIMIN ) 1 % cream Apply to affected area 2 times daily for 14 days 02/13/24  Yes Veta Palma, PA-C  albuterol  (VENTOLIN  HFA) 108 (90 Base) MCG/ACT inhaler Inhale 2 puffs into the lungs every 6 (six) hours as needed for wheezing or shortness of breath. 09/21/23   Newlin, Enobong, MD  baclofen  (LIORESAL ) 10 MG tablet TAKE 1 TABLET(10 MG) BY MOUTH TWICE DAILY AS NEEDED FOR MUSCLE SPASMS 06/10/23   Emeline Search C, DO  bictegravir-emtricitabine -tenofovir  AF (BIKTARVY ) 50-200-25 MG TABS tablet Take 1 tablet by mouth daily. 08/16/23   Luiz Channel, MD  buPROPion  (WELLBUTRIN  XL) 150 MG 24 hr tablet Take 1 tablet (150 mg total) by mouth daily. For smoking cessation 05/24/23    Newlin, Enobong, MD  chlorhexidine  (HIBICLENS ) 4 % external liquid Apply topically daily. For 5 days 09/21/23   Newlin, Enobong, MD  doxycycline  (VIBRA -TABS) 100 MG tablet Take 1 tablet (100 mg total) by mouth 2 (two) times daily. 09/21/23   Newlin, Enobong, MD  doxycycline  (VIBRA -TABS) 100 MG tablet Take 1 tablet (100 mg total) by mouth 2 (two) times daily. Take on full stomach. With lots of water 12/07/23   Luiz Channel, MD  DULoxetine  (CYMBALTA ) 30 MG capsule Take 1 capsule (30 mg total) by mouth daily. 09/21/23   Newlin, Enobong, MD  fluticasone  (FLONASE ) 50 MCG/ACT nasal spray Place 2 sprays into both nostrils daily. 09/21/23   Newlin, Enobong, MD  levocetirizine (XYZAL ) 5 MG tablet Take 1 tablet (5 mg total) by mouth every evening. 09/21/23   Newlin, Enobong, MD  Misc. [employer] Diagnosis- lower back pain 07/22/22   Delbert Clam, MD  rizatriptan  (MAXALT ) 10 MG tablet Take 1 tablet (10 mg total) by mouth as needed for migraine. May repeat in 2 hours if needed 09/21/23   Newlin, Enobong, MD  valACYclovir  (VALTREX ) 500 MG tablet TAKE 1 TABLET BY MOUTH TWICE DAILY 02/02/22   Comer, Lamar ORN, MD    Allergies: Dapsone, Other, and Primaquine    Review of Systems  Cardiovascular:  Positive for syncope.    Updated Vital Signs BP 134/81 (BP Location: Left Arm)   Pulse 64   Temp 98.1 F (36.7 C) (Oral)  Resp 17   Ht 6' (1.829 m)   Wt 77.1 kg   SpO2 100%   BMI 23.06 kg/m   Physical Exam Vitals and nursing note reviewed. Exam conducted with a chaperone present.  Constitutional:      General: He is not in acute distress.    Appearance: He is well-developed.  HENT:     Head: Normocephalic and atraumatic.  Eyes:     Extraocular Movements: Extraocular movements intact.     Conjunctiva/sclera: Conjunctivae normal.     Pupils: Pupils are equal, round, and reactive to light.     Comments: No nystagmus Right eye legally blind with cataract noted  Neck:     Comments: Moves neck  left and right, up and down without difficulty Cardiovascular:     Rate and Rhythm: Normal rate and regular rhythm.     Heart sounds: No murmur heard. Pulmonary:     Effort: Pulmonary effort is normal. No respiratory distress.     Breath sounds: Normal breath sounds.  Abdominal:     Palpations: Abdomen is soft.     Tenderness: There is no abdominal tenderness.  Genitourinary:    Comments: GU exam performed with RN Aleck Molt present Patient with circumcised penis.  Mild edema and erythema just proximal to the glans of  Mild erythema.  No lesions or ulcerations.  No penile discharge.  No swelling or erythema of the scrotum.  No testicular tenderness to palpation. Musculoskeletal:        General: No swelling.     Cervical back: Neck supple.     Comments: Mild tenderness at the base of the cervical spine and to the musculature left of the cervical spine and into the trapezius on the left. No thoracic or lumbar spinal tenderness palpation.  No tenderness to the chest wall diffusely.  No tenderness to the upper or lower extremities diffusely.  Moves all extremities without difficulty.  Ambulates without difficulty  Skin:    General: Skin is warm and dry.     Capillary Refill: Capillary refill takes less than 2 seconds.  Neurological:     General: No focal deficit present.     Mental Status: He is alert and oriented to person, place, and time.     Comments: Mental status: Alert and oriented to self, place, and month  Speech: Answers questions appropriately  Cranial Nerves: III, IV, VI: EOM intact, Pupils equal round and reactive, no gaze preference or deviation, no nystagmus. V: normal sensation in V1, V2, and V3 segments bilaterally VII: smiles, puffs cheeks, raises eyebrows, and closes eyes without asymmetry.  VIII: normal hearing to speech IX, X: normal palatal elevation, no uvular deviation XI: 5/5 head turn and 5/5 shoulder shrug bilaterally XII: midline tongue  protrusion  Motor: 5/5 strength with resisted plantarflexion and dorsiflexion bilaterally, wrist flexion extension bilaterally   Sensory: Intact sensation in upper and lower extremity bilaterally    Coordination: Normal finger to nose and heel to shin, no tremor, no dysmetria  Gait: Normal  Psychiatric:        Mood and Affect: Mood normal.     (all labs ordered are listed, but only abnormal results are displayed) Labs Reviewed  BASIC METABOLIC PANEL WITH GFR - Abnormal; Notable for the following components:      Result Value   Glucose, Bld 61 (*)    All other components within normal limits  CBC WITH DIFFERENTIAL/PLATELET  MAGNESIUM   CBG MONITORING, ED  GC/CHLAMYDIA PROBE AMP ([place])  NOT AT [facility]  TROPONIN T, HIGH SENSITIVITY    EKG: EKG Interpretation Date/Time:  Monday February 13 2024 10:06:00 EDT Ventricular Rate:  71 PR Interval:  143 QRS Duration:  96 QT Interval:  390 QTC Calculation: 424 R Axis:   -78  Text Interpretation: Sinus rhythm LAD, consider left anterior fascicular block Confirmed by Darra Chew [phone]) on 02/13/2024 10:13:54 AM  Radiology: CT C-SPINE NO CHARGE Result Date: 02/13/2024 EXAM: CT CERVICAL SPINE WITHOUT CONTRAST 02/13/2024 12:24:55 PM TECHNIQUE: CT of the cervical spine was performed without the administration of intravenous contrast. Multiplanar reformatted images are provided for review. Automated exposure control, iterative reconstruction, and/or weight based adjustment of the mA/kV was utilized to reduce the radiation dose to as low as reasonably achievable. COMPARISON: None available. CLINICAL HISTORY: Neck trauma, midline tenderness (Age 49-64y). While doing yard work last week, he bent over and stood back up quickly which lead to a syncopal episode. Pt states he has been intermittently dizzy ever since, left sided neck pains, no other complaints. FINDINGS: CERVICAL SPINE: BONES AND ALIGNMENT: The cervical vertebrae maintain the  height and alignment. No acute fracture or traumatic malalignment. DEGENERATIVE CHANGES: There is mild disc space narrowing and endplate ridging at C6-C7, causing mild central spinal canal stenosis and bilateral neural foraminal stenosis. There are anterior osteophytes within the lower cervical spine. SOFT TISSUES: No prevertebral soft tissue swelling. LUNGS: There are subpleural blebs noted in the lung apices. IMPRESSION: 1. No acute abnormality of the cervical spine. 2. Mild degenerative changes at C6-7 with mild central canal and bilateral neural foraminal stenosis. Electronically signed by: Evalene Coho MD 02/13/2024 12:40 PM EDT RP Workstation: [id number]   CT ANGIO HEAD NECK W WO CM Result Date: 02/13/2024 EXAM: CTA HEAD AND NECK WITH AND WITHOUT 02/13/2024 12:24:55 PM TECHNIQUE: CTA of the head and neck was performed with and without the administration of 75 mL of intravenous contrast (iohexol  (OMNIPAQUE ) 350 MG/ML injection 100 mL IOHEXOL  350 MG/ML SOLN) at 4 cc/sec. Multiplanar 2D and/or 3D reformatted images are provided for review. Automated exposure control, iterative reconstruction, and/or weight based adjustment of the mA/kV was utilized to reduce the radiation dose to as low as reasonably achievable. Stenosis of the internal carotid arteries measured using NASCET criteria. COMPARISON: CT of the head dated 06/12/2021. CLINICAL HISTORY: Post-coital dizziness. While doing yard work last week, he bent over and stood back up quickly which lead to a syncopal episode. Pt states he has been intermittently dizzy ever since, left sided neck pains, no other complaints. FINDINGS: CTA NECK: AORTIC ARCH AND ARCH VESSELS: No dissection or arterial injury. No significant stenosis of the brachiocephalic or subclavian arteries. CERVICAL CAROTID ARTERIES: The cervical segment of the left internal carotid artery is moderately tortuous but normal in caliber. No dissection, arterial injury, or hemodynamically  significant stenosis by NASCET criteria. CERVICAL VERTEBRAL ARTERIES: No dissection, arterial injury, or significant stenosis. LUNGS AND MEDIASTINUM: Unremarkable. SOFT TISSUES: No acute abnormality. BONES: No acute abnormality. CTA HEAD: ANTERIOR CIRCULATION: No significant stenosis of the internal carotid arteries. No significant stenosis of the anterior cerebral arteries. No significant stenosis of the middle cerebral arteries. No aneurysm. POSTERIOR CIRCULATION: No significant stenosis of the posterior cerebral arteries. No significant stenosis of the basilar artery. No significant stenosis of the vertebral arteries. No aneurysm. OTHER: No dural venous sinus thrombosis on this non-dedicated study. IMPRESSION: 1. No large vessel occlusion, hemodynamically significant stenosis, or aneurysm in the head or neck. Electronically signed by: Evalene Coho MD 02/13/2024 12:37  PM EDT RP Workstation: [id number]     Procedures   Medications Ordered in the ED  sodium chloride  0.9 % bolus 500 mL (0 mLs Intravenous Stopped 02/13/24 1200)  iohexol  (OMNIPAQUE ) 350 MG/ML injection 100 mL (75 mLs Intravenous Contrast Given 02/13/24 1209)                                    Medical Decision Making Amount and/or Complexity of Data Reviewed Labs: ordered. Radiology: ordered.  Risk Prescription drug management.     Differential diagnosis includes but is not limited to orthostatic hypotension, hypoglycemia, electrolyte abnormality, ACS, arrhythmia, TIA, CVA, benign paroxysmal positional vertigo, Mnire's disease, vestibular neuritis, STI, yeast infection, intracranial hemorrhage, skull fracture  ED Course:  Upon initial evaluation, patient is very well-appearing, no acute distress.  Stable vitals.  Not actively dizzy.  Denies any active complaints currently.  No neurologic deficits on exam.  No shaded nystagmus on exam.  On GU exam with RN chaperone present, patient with mild amount of erythema and edema  at the base of the glans of the penis consistent with balanitis.  No penile discharge.  No ulcerations, lesions, scrotal erythema or edema to suggest epididymitis, orchitis, STI.   Labs Ordered: I Ordered, and personally interpreted labs.  The pertinent results include:   CBC within normal limits BMP with low glucose at 61, otherwise within normal limits Magnesium  within normal limits Troponin within normal limits  Imaging Studies ordered: I ordered imaging studies including CTA head and neck, CT cervical spine I independently visualized the imaging with scope of interpretation limited to determining acute life threatening conditions related to emergency care. Imaging showed  CTA head and neck: IMPRESSION:  1. No large vessel occlusion, hemodynamically significant stenosis, or aneurysm  in the head or neck.   CT cervical spine: IMPRESSION:  1. No acute abnormality of the cervical spine.  2. Mild degenerative changes at C6-7 with mild central canal and bilateral  neural foraminal stenosis.    I agree with the radiologist interpretation   Cardiac Monitoring: / EKG: The patient was maintained on a cardiac monitor.  I personally viewed and interpreted the cardiac monitored which showed an underlying rhythm of: Normal sinus rhythm  Medications Given: 500 mL NS bolus  Upon re-evaluation, patient remains well-appearing with stable vitals.  His blood sugar was low at 61 upon arrival, he was given juice and this improved to 90.  He states that he has not been eating and drinking well at home, and question if he may have had a hypoglycemic episode which could have led to his episode of syncope this week.  The remainder of his workup is reassuring.  CBC within normal limits.  Electrolytes normal.  Troponin within normal limits.  He is denying any chest pain, shortness of breath, and EKG with normal sinus rhythm and no ST changes, low concern for cardiac etiology to his syncopal episode.  He  states he bet over and then stood up fast during the syncopal episode and did have prodromal lightheadedness, questions if he may have had a drop in blood pressure that lead to the syncopal episode.  He was ambulated here without difficulty.  He does not have any dizziness when changing positions here, no nystagmus on exam, concern for vertigo.  No other neurologic deficits, no concern for TIA or CVA.  He did hit his head when he passed out, but CTA head  and neck and CT cervical spine without acute abnormality such as skull fracture, intracranial hemorrhage. Patient stable and appropriate for discharge home   Impression: Hypoglycemia Syncopal episode, likely due to orthostatic hypotension Balanitis  Disposition:  The patient was discharged home with instructions to apply clotrimazole  cream to penis 2 times daily for the next 14 days. Drink plenty of water at home and eat regular meals throughout the day to prevent hypoglycemia.  Follow-up with PCP within the next 2 weeks if symptoms not improving. Return precautions given.    Record Review: External records from outside source obtained and reviewed including infectious disease visit from 12/07/2023     This chart was dictated using voice recognition software, Dragon. Despite the best efforts of this provider to proofread and correct errors, errors may still occur which can change documentation meaning.       Final diagnoses:  Balanitis  Syncope and collapse  Hypoglycemia    ED Discharge Orders          Ordered    clotrimazole  (LOTRIMIN ) 1 % cream        02/13/24 1252               Veta Palma, PA-C 02/13/24 1304    Long, Joshua G, MD 02/13/24 1454

## 2024-02-13 NOTE — ED Notes (Signed)
 CBG in lab 61. Pt is asymptomatic. Pt given apple juice. Provider aware. No further orders at this time.

## 2024-02-13 NOTE — ED Notes (Signed)
 Per [place] PA, repeat troponin level is not needed. Order is to be cancelled.

## 2024-02-14 LAB — GC/CHLAMYDIA PROBE AMP (~~LOC~~) NOT AT ARMC
Chlamydia: NEGATIVE
Comment: NEGATIVE
Comment: NORMAL
Neisseria Gonorrhea: NEGATIVE

## 2024-03-07 ENCOUNTER — Ambulatory Visit (INDEPENDENT_AMBULATORY_CARE_PROVIDER_SITE_OTHER): Admitting: Internal Medicine

## 2024-03-07 ENCOUNTER — Other Ambulatory Visit: Payer: Self-pay

## 2024-03-07 ENCOUNTER — Encounter: Payer: Self-pay | Admitting: Internal Medicine

## 2024-03-07 VITALS — BP 120/87 | HR 92 | Temp 98.5°F | Wt 178.0 lb

## 2024-03-07 DIAGNOSIS — G43009 Migraine without aura, not intractable, without status migrainosus: Secondary | ICD-10-CM

## 2024-03-07 DIAGNOSIS — R55 Syncope and collapse: Secondary | ICD-10-CM | POA: Diagnosis not present

## 2024-03-07 DIAGNOSIS — B20 Human immunodeficiency virus [HIV] disease: Secondary | ICD-10-CM

## 2024-03-07 DIAGNOSIS — Z79899 Other long term (current) drug therapy: Secondary | ICD-10-CM | POA: Diagnosis not present

## 2024-03-07 DIAGNOSIS — Z716 Tobacco abuse counseling: Secondary | ICD-10-CM

## 2024-03-07 MED ORDER — ROSUVASTATIN CALCIUM 10 MG PO TABS: 10.0000 mg | ORAL_TABLET | Freq: Every day | ORAL | 11 refills | Status: AC

## 2024-03-07 MED ORDER — BUPROPION HCL ER (XL) 150 MG PO TB24: 150.0000 mg | ORAL_TABLET | Freq: Every day | ORAL | 1 refills | Status: DC

## 2024-03-07 MED ORDER — RIZATRIPTAN BENZOATE 10 MG PO TABS: 10.0000 mg | ORAL_TABLET | ORAL | 1 refills | Status: AC | PRN

## 2024-03-07 NOTE — Progress Notes (Signed)
 RFV: follow up for hiv disease Patient ID: Mason Moran, male   DOB: January 16, 1971, 53 y.o.   MRN: [id number]  HPI Mason Moran is a 53yo M with HIV disease, currently taking biktarvy . Hx of migraines, and smoking. Since our last visit He reports passing out while at gas station. - syncopal event - but he is unable to recall events leading up to the event. He states that he didn't sustain any injury, loss of consciousness In review of chart, he went to the ED to evaluate rash to groin, where he was diagnosed with  yeast infection. now improved since using anti-fungal cream. ROS:  Has at least once a week headache/migraines affecting left eye.  Drinks 2 pots of coffee - before 6:30 am body is used to wake up at 4:30 am. Goes to bed at 8:30pm.  Outpatient Encounter Medications as of 03/07/2024  Medication Sig   bictegravir-emtricitabine -tenofovir  AF (BIKTARVY ) 50-200-25 MG TABS tablet Take 1 tablet by mouth daily.   albuterol  (VENTOLIN  HFA) 108 (90 Base) MCG/ACT inhaler Inhale 2 puffs into the lungs every 6 (six) hours as needed for wheezing or shortness of breath.   baclofen  (LIORESAL ) 10 MG tablet TAKE 1 TABLET(10 MG) BY MOUTH TWICE DAILY AS NEEDED FOR MUSCLE SPASMS   buPROPion  (WELLBUTRIN  XL) 150 MG 24 hr tablet Take 1 tablet (150 mg total) by mouth daily. For smoking cessation   chlorhexidine  (HIBICLENS ) 4 % external liquid Apply topically daily. For 5 days   clotrimazole  (LOTRIMIN ) 1 % cream Apply to affected area 2 times daily for 14 days   doxycycline  (VIBRA -TABS) 100 MG tablet Take 1 tablet (100 mg total) by mouth 2 (two) times daily.   doxycycline  (VIBRA -TABS) 100 MG tablet Take 1 tablet (100 mg total) by mouth 2 (two) times daily. Take on full stomach. With lots of water   DULoxetine  (CYMBALTA ) 30 MG capsule Take 1 capsule (30 mg total) by mouth daily.   fluticasone  (FLONASE ) 50 MCG/ACT nasal spray Place 2 sprays into both nostrils daily.   levocetirizine (XYZAL ) 5 MG tablet Take 1 tablet  (5 mg total) by mouth every evening.   Misc. [employer] Diagnosis- lower back pain   rizatriptan  (MAXALT ) 10 MG tablet Take 1 tablet (10 mg total) by mouth as needed for migraine. May repeat in 2 hours if needed   valACYclovir  (VALTREX ) 500 MG tablet TAKE 1 TABLET BY MOUTH TWICE DAILY   No facility-administered encounter medications on file as of 03/07/2024.     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 06/12/2021   Anemia 06/12/2021   Elevated platelet count 06/12/2021   Transaminitis 06/12/2021   Vision loss      Health Maintenance Due  Topic Date Due   COVID-19 Vaccine (1) Never done   Pneumococcal Vaccine: 50+ Years (1 of 2 - PCV) Never done   Hepatitis B Vaccines 19-59 Average Risk (1 of 3 - 19+ 3-dose series) Never done  Zoster Vaccines- Shingrix (1 of 2) Never done   Colonoscopy  Never done   Influenza Vaccine  Never done   Medicare Annual Wellness (AWV)  02/17/2024   Lung Cancer Screening  03/03/2024     Review of Systems 12 point ros is otherwise negative Physical Exam   Wt 178 lb (80.7 kg)   BMI 24.14 kg/m   Physical Exam  Constitutional: He is oriented to person, place, and time. He appears well-developed and well-nourished. No distress.  HENT:  Mouth/Throat: Oropharynx is clear and moist. No oropharyngeal exudate.  Cardiovascular: Normal rate, regular rhythm and  normal heart sounds. Exam reveals no gallop and no friction rub.  No murmur heard.  Pulmonary/Chest: Effort normal and breath sounds normal. No respiratory distress. He has no wheezes.  Lymphadenopathy:  He has no cervical adenopathy.  Neurological: He is alert and oriented to person, place, and time.  Skin: Skin is warm and dry. No rash noted. No erythema.  Psychiatric: He has a normal mood and affect. His behavior is normal.   Lab Results  Component Value Date   CD4TCELL 19 (L) 12/07/2023   Lab Results  Component Value Date   CD4TABS 263 (L) 12/07/2023   CD4TABS 220 (L) 08/16/2023   CD4TABS 277 (L) 02/17/2023   Lab Results  Component Value Date   HIV1RNAQUANT NOT DETECTED 12/07/2023   No results found for: HEPBSAB Lab Results  Component Value Date   LABRPR NON-REACTIVE 12/07/2023    CBC Lab Results  Component Value Date   WBC 5.4 02/13/2024   RBC 4.48 02/13/2024   HGB 13.5 02/13/2024   HCT 41.1 02/13/2024   PLT 221 02/13/2024   MCV 91.7 02/13/2024   MCH 30.1 02/13/2024   MCHC 32.8 02/13/2024   RDW 12.9 02/13/2024   LYMPHSABS 1.3 02/13/2024   MONOABS 0.4 02/13/2024   EOSABS 0.3 02/13/2024    BMET Lab Results  Component Value Date   NA 141 02/13/2024   K 3.9 02/13/2024   CL 105 02/13/2024   CO2 26 02/13/2024   GLUCOSE 61 (L) 02/13/2024   BUN 8 02/13/2024   CREATININE 0.95 02/13/2024   CALCIUM  9.0 02/13/2024   GFRNONAA >60 02/13/2024      Assessment and Plan HIV disease= plan to  check cd 4 count and HIV VL to see that he still has good virologic control  Long term medication management = cr reviewed is stable.   Smoking cessation counseling and Will give wellbutrin  to help with smoking cessation  Health maintenance = will start on rosuvastatin 10mg  daily; check lipids today  Migraines = refill on maxalt .  Syncopal episode = if recurrence will need further work up.   Declined flu shot  I personally spent a total of 42 minutes in the care  of the patient today including preparing to see the patient, getting/reviewing separately obtained history, performing a medically appropriate exam/evaluation, counseling and educating, placing orders, documenting clinical information in the EHR, independently interpreting results, and communicating results.

## 2024-03-07 NOTE — Patient Instructions (Signed)
 Smoking Cessation: QuitlineNC 1-800-QUIT-NOW [phone]); Espaol: 1-855-Djelo-Ya (1-737-271-8849) [email or web address]

## 2024-03-08 LAB — T-HELPER CELL (CD4) - (RCID CLINIC ONLY)
CD4 % Helper T Cell: 19 % — ABNORMAL LOW (ref 33–65)
CD4 T Cell Abs: 284 /uL — ABNORMAL LOW (ref 400–1790)

## 2024-03-09 LAB — LIPID PANEL
Cholesterol: 154 mg/dL (ref <200)
HDL: 69 mg/dL (ref >=40)
LDL Cholesterol (Calc): 69 mg/dL
Non-HDL Cholesterol (Calc): 85 mg/dL (ref <130)
Total CHOL/HDL Ratio: 2.2 (calc) (ref <5.0)
Triglycerides: 82 mg/dL (ref <150)

## 2024-03-09 LAB — HIV-1 RNA QUANT-NO REFLEX-BLD
HIV 1 RNA Quant: <20 {copies}/mL — AB
HIV-1 RNA Quant, Log: <1.3 {Log_copies}/mL — AB

## 2024-03-26 ENCOUNTER — Ambulatory Visit: Attending: Family Medicine | Admitting: Family Medicine

## 2024-03-26 ENCOUNTER — Encounter: Payer: Self-pay | Admitting: Family Medicine

## 2024-03-26 VITALS — BP 126/78 | HR 68 | Temp 98.3°F | Ht 72.0 in | Wt 178.8 lb

## 2024-03-26 DIAGNOSIS — L403 Pustulosis palmaris et plantaris: Secondary | ICD-10-CM | POA: Diagnosis not present

## 2024-03-26 DIAGNOSIS — B20 Human immunodeficiency virus [HIV] disease: Secondary | ICD-10-CM | POA: Diagnosis not present

## 2024-03-26 DIAGNOSIS — M79676 Pain in unspecified toe(s): Secondary | ICD-10-CM | POA: Diagnosis not present

## 2024-03-26 DIAGNOSIS — Z1211 Encounter for screening for malignant neoplasm of colon: Secondary | ICD-10-CM | POA: Diagnosis not present

## 2024-03-26 DIAGNOSIS — R911 Solitary pulmonary nodule: Secondary | ICD-10-CM

## 2024-03-26 DIAGNOSIS — F17210 Nicotine dependence, cigarettes, uncomplicated: Secondary | ICD-10-CM | POA: Diagnosis not present

## 2024-03-26 NOTE — Progress Notes (Signed)
 Subjective:  Patient ID: Mason Moran, male    DOB: [birth year]  Age: 53 y.o. MRN: [id number]  CC: Medical Management of Chronic Issues (Referral to podiatry for toe nail concerns/Referral to dermatology/ )     Discussed the use of AI scribe software for clinical note transcription with the patient, who gave verbal consent to proceed.  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  He seeks a podiatrist referral due to toenail issues. A pedicure technician advised removal of one toenail, though it is not painful.  He also seeks a dermatologist referral for recurring hand rash. He experiences itching and small vesicles on his hands that release a pus-like substance when ruptured. Previous treatment provided some relief, but he ran out of medication and did not receive a refill. His last dermatology visit was two months ago.  He has a smoking history, currently smoking about a pack a day, and is attempting to quit. He reports improved breathing after reducing smoking. A lung nodule was identified in a CT scan in 2024 and a CT chest LCS nodule f/u was ordered but never completed.  He is under HIV specialist care and on prescribed medication. He has not yet undergone a colonoscopy.    Past Medical History:  Diagnosis Date   HIV (human immunodeficiency virus infection) (HCC)     No past surgical history on file.  No family history on file.  Social History   Socioeconomic History   Marital status: Single    Spouse name: Not on file   Number of children: Not on file   Years of education: Not on file   Highest education level: Not on file  Occupational History   Not on file  Tobacco Use   Smoking status: Every Day    Current packs/day: 0.50     Types: Cigarettes   Smokeless tobacco: Never  Vaping Use   Vaping status: Never Used  Substance and Sexual Activity   Alcohol use: Not Currently   Drug use: Yes    Types: Marijuana    Comment: daily   Sexual activity: Not Currently    Comment: declined condoms  Other Topics Concern   Not on file  Social History Narrative   Not on file   Social Drivers of Health   Financial Resource Strain: Low Risk  (05/24/2023)   Overall Financial Resource Strain (CARDIA)    Difficulty of Paying Living Expenses: Not very hard  Food Insecurity: No Food Insecurity (05/24/2023)   Hunger Vital Sign    Worried About Running Out of Food in the Last Year: Never true    Ran Out of Food in the Last Year: Never true  Transportation Needs: No Transportation Needs (05/24/2023)   PRAPARE - Administrator, Civil Service (Medical): No    Lack of Transportation (Non-Medical): No  Physical Activity: Sufficiently Active (05/24/2023)   Exercise Vital Sign    Days of Exercise per Week: 7 days    Minutes of Exercise per Session: 30 min  Stress: No Stress Concern Present (05/24/2023)   [employer] of Occupational Health - Occupational Stress Questionnaire    Feeling of Stress : Not at all  Social Connections: Moderately Integrated (05/24/2023)   Social Connection and Isolation Panel    Frequency of  Communication with Friends and Family: Twice a week    Frequency of Social Gatherings with Friends and Family: More than three times a week    Attends Religious Services: 1 to 4 times per year    Active Member of [employer] or Organizations: Yes    Attends Engineer, Structural: More than 4 times per year    Marital Status: Never married    Allergies  Allergen Reactions   Dapsone Other (See Comments)    Other reaction(s): Other (See Comments) G6PD deficiency   Other Other (See Comments)    Seafood - can't speak   Primaquine     Other reaction(s): Other (See Comments) G6PD deficiency     Outpatient Medications Prior to Visit  Medication Sig Dispense Refill   albuterol  (VENTOLIN  HFA) 108 (90 Base) MCG/ACT inhaler Inhale 2 puffs into the lungs every 6 (six) hours as needed for wheezing or shortness of breath. 8 g 2   bictegravir-emtricitabine -tenofovir  AF (BIKTARVY ) 50-200-25 MG TABS tablet Take 1 tablet by mouth daily. 30 tablet 11   buPROPion  (WELLBUTRIN  XL) 150 MG 24 hr tablet Take 1 tablet (150 mg total) by mouth daily. For smoking cessation 90 tablet 1   rizatriptan  (MAXALT ) 10 MG tablet Take 1 tablet (10 mg total) by mouth as needed for migraine. May repeat in 2 hours if needed 10 tablet 1   rosuvastatin (CRESTOR) 10 MG tablet Take 1 tablet (10 mg total) by mouth daily. 30 tablet 11   Misc. Devices [employer] Diagnosis- lower back pain 1 each 0   No facility-administered medications prior to visit.     ROS Review of Systems  Constitutional:  Negative for activity change and appetite change.  HENT:  Negative for sinus pressure and sore throat.   Respiratory:  Negative for chest tightness, shortness of breath and wheezing.   Cardiovascular:  Negative for chest pain and palpitations.  Gastrointestinal:  Negative for abdominal distention, abdominal pain and constipation.  Genitourinary: Negative.   Musculoskeletal: Negative.   Skin:  Positive for rash.  Psychiatric/Behavioral:  Negative for behavioral problems and dysphoric mood.     Objective:  BP 126/78   Pulse 68   Temp 98.3 F (36.8 C) (Oral)   Ht 6' (1.829 m)   Wt 178 lb 12.8 oz (81.1 kg)   SpO2 100%   BMI 24.25 kg/m      03/26/2024    8:59 AM 03/07/2024    9:04 AM 02/13/2024   12:20 PM  BP/Weight  Systolic BP 126 120 134  Diastolic BP 78 87 81  Wt. (Lbs) 178.8 178   BMI 24.25 kg/m2 24.14 kg/m2       Physical Exam Constitutional:      Appearance: He is well-developed.  Cardiovascular:     Rate and Rhythm: Normal rate.     Heart sounds: Normal heart sounds. No murmur heard. Pulmonary:      Effort: Pulmonary effort is normal.     Breath sounds: Normal breath sounds. No wheezing or rales.  Chest:     Chest wall: No tenderness.  Abdominal:     General: Bowel sounds are normal. There is no distension.     Palpations: Abdomen is soft. There is no mass.     Tenderness: There is no abdominal tenderness.  Musculoskeletal:        General: Normal range of motion.     Right lower leg: No edema.     Left lower leg: No edema.  Skin:  Comments: See image below  Neurological:     Mental Status: He is alert and oriented to person, place, and time.  Psychiatric:        Mood and Affect: Mood normal.        Latest Ref Rng & Units 02/13/2024   10:52 AM 12/07/2023   10:15 AM 08/16/2023   11:03 AM  CMP  Glucose 70 - 99 mg/dL 61  95  89   BUN 6 - 20 mg/dL 8  14  6    Creatinine 0.61 - 1.24 mg/dL 9.04  8.96  8.98   Sodium 135 - 145 mmol/L 141  136  138   Potassium 3.5 - 5.1 mmol/L 3.9  4.4  4.1   Chloride 98 - 111 mmol/L 105  104  104   CO2 22 - 32 mmol/L 26  26  30    Calcium  8.9 - 10.3 mg/dL 9.0  9.4  9.5   Total Protein 6.1 - 8.1 g/dL  7.6  7.5   Total Bilirubin 0.2 - 1.2 mg/dL  0.6  0.5   AST 10 - 35 U/L  22  19   ALT 9 - 46 U/L  16  17     Lipid Panel     Component Value Date/Time   CHOL 154 03/07/2024 0938   TRIG 82 03/07/2024 0938   HDL 69 03/07/2024 0938   CHOLHDL 2.2 03/07/2024 0938   LDLCALC 69 03/07/2024 0938    CBC    Component Value Date/Time   WBC 5.4 02/13/2024 1052   RBC 4.48 02/13/2024 1052   HGB 13.5 02/13/2024 1052   HGB 12.3 (L) 07/21/2021 1531   HCT 41.1 02/13/2024 1052   HCT 36.9 (L) 07/21/2021 1531   PLT 221 02/13/2024 1052   PLT 277 07/21/2021 1531   MCV 91.7 02/13/2024 1052   MCV 90 07/21/2021 1531   MCH 30.1 02/13/2024 1052   MCHC 32.8 02/13/2024 1052   RDW 12.9 02/13/2024 1052   RDW 17.7 (H) 07/21/2021 1531   LYMPHSABS 1.3 02/13/2024 1052   LYMPHSABS 1.7 07/21/2021 1531   MONOABS 0.4 02/13/2024 1052   EOSABS 0.3 02/13/2024 1052    EOSABS 1.1 (H) 07/21/2021 1531   BASOSABS 0.0 02/13/2024 1052   BASOSABS 0.0 07/21/2021 1531    Lab Results  Component Value Date   HGBA1C 4.8 07/21/2021       Assessment & Plan 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 - Advised follow-up with Dermatologist  for ongoing management. -He was last seen 2 months ago and has been advised to call the Clinic for a follow up   Lung nodule under surveillance Lung nodule identified on CT scan in 2024 requires follow-up imaging. - CT chest LCS nodule f/o ordered but not completed by patient - Ordered follow-up CT scan of the lungs as he is due for lung ca screening  Smoking greater than 20 year  Continued nicotine dependence with reduced smoking.  - Encouraged smoking cessation to improve respiratory health.  Colon cancer screening, due Colon cancer screening is due. Colonoscopy recommended for individuals over 60 years old. - Referred for colonoscopy for colon cancer screening.  Pain around toenails Onychodystrophy of toenails requiring removal. - Referred to podiatrist for toenail removal.   HIV Disease Stable Management as per Infectious disease     No orders of the defined types were placed in this encounter.   Follow-up: Return in about 6 months (around 09/23/2024).  Corrina Sabin, MD, FAAFP. [facility] [place], [place] [phone]   03/26/2024, 12:59 PM

## 2024-03-26 NOTE — Patient Instructions (Signed)
 VISIT SUMMARY:  During today's visit, we discussed your need for referrals to a dermatologist and a podiatrist. We also reviewed your ongoing health concerns, including your hand condition, lung nodule, smoking habits, and the need for a colonoscopy.  YOUR PLAN:  -PALMAR PUSTULOSIS: Palmar pustulosis is a skin condition that causes recurring blisters and pustules on the palms of your hands. You should follow up with Dr. Marnee, your dermatologist, for ongoing management of this condition.  -LUNG NODULE UNDER SURVEILLANCE: A lung nodule is a small growth in the lung that needs to be monitored for any changes. A follow-up CT scan of your lungs has been ordered to check the status of the nodule.  -NICOTINE DEPENDENCE AND EMPHYSEMA: Nicotine dependence means you are addicted to smoking, which can worsen emphysema, a lung condition that makes it hard to breathe. You are encouraged to continue your efforts to quit smoking to improve your respiratory health.  -COLON CANCER SCREENING, DUE: Colon cancer screening is important for detecting early signs of colon cancer. Since you are over 86 years old, a colonoscopy has been recommended for you.  -ONYCHODYSTROPHY, TOENAILS: Onychodystrophy refers to abnormal growth and appearance of the toenails. You have been referred to a podiatrist for the removal of the affected toenail.  INSTRUCTIONS:  Please schedule a follow-up appointment with Dr. Marnee, your dermatologist, for your hand condition. Additionally, make an appointment for a CT scan of your lungs to monitor the lung nodule. You should also arrange for a colonoscopy for colon cancer screening and visit a podiatrist for toenail removal. Continue your efforts to quit smoking to improve your overall health.

## 2024-04-02 ENCOUNTER — Ambulatory Visit
Admission: RE | Admit: 2024-04-02 | Discharge: 2024-04-02 | Disposition: A | Discharge status: Home / Self Care | Source: Ambulatory Visit | Attending: Family Medicine | Admitting: Family Medicine

## 2024-04-02 DIAGNOSIS — L853 Xerosis cutis: Secondary | ICD-10-CM | POA: Diagnosis not present

## 2024-04-02 DIAGNOSIS — F17210 Nicotine dependence, cigarettes, uncomplicated: Secondary | ICD-10-CM

## 2024-04-02 DIAGNOSIS — L0292 Furuncle, unspecified: Secondary | ICD-10-CM | POA: Diagnosis not present

## 2024-04-03 ENCOUNTER — Ambulatory Visit (INDEPENDENT_AMBULATORY_CARE_PROVIDER_SITE_OTHER): Admitting: Podiatry

## 2024-04-03 DIAGNOSIS — M79672 Pain in left foot: Secondary | ICD-10-CM | POA: Diagnosis not present

## 2024-04-03 DIAGNOSIS — M79671 Pain in right foot: Secondary | ICD-10-CM

## 2024-04-03 DIAGNOSIS — B351 Tinea unguium: Secondary | ICD-10-CM | POA: Diagnosis not present

## 2024-04-03 LAB — HEPATIC FUNCTION PANEL
AG Ratio: 1.2 (calc) (ref 1.0–2.5)
ALT: 24 U/L (ref 9–46)
AST: 24 U/L (ref 10–35)
Albumin: 4.1 g/dL (ref 3.6–5.1)
Alkaline phosphatase (APISO): 58 U/L (ref 35–144)
Bilirubin, Direct: 0.1 mg/dL (ref 0.0–0.2)
Globulin: 3.3 g/dL (ref 1.9–3.7)
Indirect Bilirubin: 0.5 mg/dL (ref 0.2–1.2)
Total Bilirubin: 0.6 mg/dL (ref 0.2–1.2)
Total Protein: 7.4 g/dL (ref 6.1–8.1)

## 2024-04-03 MED ORDER — TERBINAFINE HCL 250 MG PO TABS: 250.0000 mg | ORAL_TABLET | Freq: Every day | ORAL | 1 refills | Status: AC

## 2024-04-03 NOTE — Progress Notes (Signed)
   Subjective:    HPI Presents with complaint of thick painful nails that causes discomfort with  walking and wearing shoes.  Have been this way for many years and have been getting worse.  Hallux nails bother him especially.  Painful with wearing shoes   Objective:  Physical Exam   General: AAO x3, NAD  Vascular: DP and PT pulses palpable bilaterally.  Immedate capillary fill time digits. No significant lower extremity edema bilaterally.  Dermatological:  Onychomycotic mycotic changes nails 1 through 5 with discoloration nail and subungual debris and thickening of the nail with redness along the nail folds.  Tenderness with pressure on nail plates..  Neruologic:  Grossly intact B/L.  Achilles tendon reflex bilaterally.  Light touch intact.  Musculoskeletal:  Normal lower extremity muscle strength.  Assessment:  Painful onychomycotic nails 1 through 5 bilaterally. Pain feet b/l     Plan:  - New patient office visit level 3 for evaluation and management -Discussed with with patient onychomycosis and etiology and treatment.  Discussed topical versus oral agents.  Discussed risk and benefits of both.  No history of hepatic or renal disease.  Patient would like to start oral Lamisil  treatment.  Explained that we would check labs every 6 weeks during course of treatment to monitor for any side effects.  Discussed that if the hallux nails continue to bother him long-term they can always be permanently removed. -Rx: Lamisil  250 mg p.o. daily, refill x 1 -Labs ordered today for liver function tests to monitor for any hepatic side effects from the Lamisil .  Return 6 weeks Lamisil  3 and LFTs

## 2024-04-09 ENCOUNTER — Ambulatory Visit: Payer: Self-pay

## 2024-04-09 NOTE — Telephone Encounter (Signed)
 FYI Only or Action Required?: FYI only for provider: appointment scheduled on 04/10/24.  Patient was last seen in primary care on 03/26/2024 by Delbert Clam, MD.  Called Nurse Triage reporting Mass.  Symptoms began today.  Interventions attempted: Nothing.  Symptoms are: stable.  Triage Disposition: See Physician Within 24 Hours  Patient/caregiver understands and will follow disposition?: Yes              Copied from CRM [id number]. Topic: Clinical - Red Word Triage >> Apr 09, 2024 11:41 AM Rosina BIRCH wrote: Red Word that prompted transfer to Nurse Triage: knot above his belly button and his stomach feels funny and is sore Reason for Disposition  [1] Swelling is painful to touch AND [2] no fever  Answer Assessment - Initial Assessment Questions 1. APPEARANCE of SWELLING: What does it look like?     Knot.  2. SIZE: How large is the swelling? (e.g., inches, cm; or compare to size of pinhead, tip of pen, eraser, coin, pea, grape, ping pong ball)      About the size of pinky nail.  3. LOCATION: Where is the swelling located?     Just above the belly button.  4. ONSET: When did the swelling start?     This morning when he woke up.  5. COLOR: What color is it? Is there more than one color?     No redness, normal skin color.  6. PAIN: Is there any pain? If Yes, ask: How bad is the pain? (Scale 1-10; or mild, moderate, severe)       Not sever, more like sore.  7. ITCH: Does it itch? If Yes, ask: How bad is the itch?      No.  8. CAUSE: What do you think caused the swelling?     Unsure.  9 OTHER SYMPTOMS: Do you have any other symptoms? (e.g., fever)     Strong, foul odor to urine. No fever, SOB or difficulty breathing.  Protocols used: Skin Lump or Localized Swelling-A-AH

## 2024-04-10 ENCOUNTER — Telehealth: Payer: Self-pay | Admitting: Family Medicine

## 2024-04-10 ENCOUNTER — Other Ambulatory Visit: Payer: Self-pay | Admitting: Family Medicine

## 2024-04-10 ENCOUNTER — Ambulatory Visit: Attending: Family Medicine | Admitting: Family Medicine

## 2024-04-10 ENCOUNTER — Encounter: Payer: Self-pay | Admitting: Family Medicine

## 2024-04-10 VITALS — BP 123/76 | HR 63 | Temp 98.2°F | Ht 72.0 in | Wt 179.0 lb

## 2024-04-10 DIAGNOSIS — R399 Unspecified symptoms and signs involving the genitourinary system: Secondary | ICD-10-CM

## 2024-04-10 DIAGNOSIS — R918 Other nonspecific abnormal finding of lung field: Secondary | ICD-10-CM

## 2024-04-10 DIAGNOSIS — K429 Umbilical hernia without obstruction or gangrene: Secondary | ICD-10-CM

## 2024-04-10 LAB — POCT URINALYSIS DIP (CLINITEK)
Bilirubin, UA: NEGATIVE
Blood, UA: NEGATIVE
Glucose, UA: NEGATIVE mg/dL
Ketones, POC UA: NEGATIVE mg/dL
Leukocytes, UA: NEGATIVE
Nitrite, UA: NEGATIVE
POC PROTEIN,UA: NEGATIVE
Spec Grav, UA: 1.015 (ref 1.010–1.025)
Urobilinogen, UA: 0.2 U/dL
pH, UA: 7 (ref 5.0–8.0)

## 2024-04-10 NOTE — Telephone Encounter (Signed)
 Routing to PCP for review.

## 2024-04-10 NOTE — Telephone Encounter (Signed)
 Addressed.

## 2024-04-10 NOTE — Patient Instructions (Signed)
 Please call to schedule your colonoscopy: Placed in [facility] [address] [place], [place] [address] PH# [phone]

## 2024-04-10 NOTE — Progress Notes (Signed)
 Subjective:  Patient ID: Mason Moran, male    DOB: [birth year]  Age: 53 y.o. MRN: [id number]  CC: Cyst (Knot on upper abdomen for 2 days)     Discussed the use of AI scribe software for clinical note transcription with the patient, who gave verbal consent to proceed.  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  He noticed a tender knot in his umbilicus starting Sunday, with a vague 'queasy' feeling in his stomach. He has no nausea, vomiting, or constipation.  His urine has had a strong odor since the weekend without dysuria or pain. He thinks he may be dehydrated and is trying to increase fluids by drinking water from a large jar.    Past Medical History:  Diagnosis Date   HIV (human immunodeficiency virus infection) (HCC)     No past surgical history on file.  No family history on file.  Social History   Socioeconomic History   Marital status: Single    Spouse name: Not on file   Number of children: Not on file   Years of education: Not on file   Highest education level: Not on file  Occupational History   Not on file  Tobacco Use   Smoking status: Every Day    Current packs/day: 0.50    Types: Cigarettes   Smokeless tobacco: Never  Vaping Use   Vaping status: Never Used  Substance and Sexual Activity   Alcohol use: Not Currently   Drug use: Yes    Types: Marijuana    Comment: daily   Sexual activity: Not Currently    Comment: declined condoms  Other Topics Concern   Not on file  Social History Narrative   Not on file   Social Drivers of Health   Financial Resource Strain: Low Risk  (05/24/2023)   Overall Financial Resource Strain (CARDIA)    Difficulty of Paying Living Expenses: Not very hard  Food Insecurity: No  Food Insecurity (05/24/2023)   Hunger Vital Sign    Worried About Running Out of Food in the Last Year: Never true    Ran Out of Food in the Last Year: Never true  Transportation Needs: No Transportation Needs (05/24/2023)   PRAPARE - Administrator, Civil Service (Medical): No    Lack of Transportation (Non-Medical): No  Physical Activity: Sufficiently Active (05/24/2023)   Exercise Vital Sign    Days of Exercise per Week: 7 days    Minutes of Exercise per Session: 30 min  Stress: No Stress Concern Present (05/24/2023)   [employer] of Occupational Health - Occupational Stress Questionnaire    Feeling of Stress : Not at all  Social Connections: Moderately Integrated (05/24/2023)   Social Connection and Isolation Panel    Frequency of Communication with Friends and Family: Twice a week    Frequency of Social Gatherings with Friends and Family: More than three times a week    Attends Religious Services: 1 to 4 times per year    Active Member of [employer] or Organizations: Yes    Attends Engineer, Structural: More than 4 times per year    Marital Status: Never married    Allergies  Allergen Reactions   Dapsone Other (See Comments)    Other reaction(s):  Other (See Comments) G6PD deficiency   Other Other (See Comments)    Seafood - can't speak   Primaquine     Other reaction(s): Other (See Comments) G6PD deficiency    Outpatient Medications Prior to Visit  Medication Sig Dispense Refill   albuterol  (VENTOLIN  HFA) 108 (90 Base) MCG/ACT inhaler Inhale 2 puffs into the lungs every 6 (six) hours as needed for wheezing or shortness of breath. 8 g 2   bictegravir-emtricitabine -tenofovir  AF (BIKTARVY ) 50-200-25 MG TABS tablet Take 1 tablet by mouth daily. 30 tablet 11   buPROPion  (WELLBUTRIN  XL) 150 MG 24 hr tablet Take 1 tablet (150 mg total) by mouth daily. For smoking cessation 90 tablet 1   Misc. Devices [employer] Diagnosis- lower back pain 1 each 0   rizatriptan   (MAXALT ) 10 MG tablet Take 1 tablet (10 mg total) by mouth as needed for migraine. May repeat in 2 hours if needed 10 tablet 1   rosuvastatin  (CRESTOR ) 10 MG tablet Take 1 tablet (10 mg total) by mouth daily. 30 tablet 11   terbinafine  (LAMISIL ) 250 MG tablet Take 1 tablet (250 mg total) by mouth daily. 30 tablet 1   No facility-administered medications prior to visit.     ROS Review of Systems  Constitutional:  Negative for activity change and appetite change.  HENT:  Negative for sinus pressure and sore throat.   Respiratory:  Negative for chest tightness, shortness of breath and wheezing.   Cardiovascular:  Negative for chest pain and palpitations.  Gastrointestinal:  Negative for abdominal distention, abdominal pain and constipation.  Genitourinary: Negative.   Musculoskeletal: Negative.   Psychiatric/Behavioral:  Negative for behavioral problems and dysphoric mood.     Objective:  BP 123/76   Pulse 63   Temp 98.2 F (36.8 C) (Oral)   Ht 6' (1.829 m)   Wt 179 lb (81.2 kg)   SpO2 100%   BMI 24.28 kg/m      04/10/2024    8:41 AM 03/26/2024    8:59 AM 03/07/2024    9:04 AM  BP/Weight  Systolic BP 123 126 120  Diastolic BP 76 78 87  Wt. (Lbs) 179 178.8 178  BMI 24.28 kg/m2 24.25 kg/m2 24.14 kg/m2      Physical Exam Constitutional:      Appearance: He is well-developed.  Cardiovascular:     Rate and Rhythm: Normal rate.     Heart sounds: Normal heart sounds. No murmur heard. Pulmonary:     Effort: Pulmonary effort is normal.     Breath sounds: Normal breath sounds. No wheezing or rales.  Chest:     Chest wall: No tenderness.  Abdominal:     General: Bowel sounds are normal. There is no distension.     Palpations: Abdomen is soft. There is no mass.     Tenderness: There is no abdominal tenderness.     Hernia: A hernia (supraubilical, slightly TTP reducible) is present.  Musculoskeletal:        General: Normal range of motion.     Right lower leg: No edema.      Left lower leg: No edema.  Neurological:     Mental Status: He is alert and oriented to person, place, and time.  Psychiatric:        Mood and Affect: Mood normal.        Latest Ref Rng & Units 04/03/2024   10:08 AM 02/13/2024   10:52 AM 12/07/2023   10:15 AM  CMP  Glucose  70 - 99 mg/dL  61  95   BUN 6 - 20 mg/dL  8  14   Creatinine 9.38 - 1.24 mg/dL  9.04  8.96   Sodium 864 - 145 mmol/L  141  136   Potassium 3.5 - 5.1 mmol/L  3.9  4.4   Chloride 98 - 111 mmol/L  105  104   CO2 22 - 32 mmol/L  26  26   Calcium  8.9 - 10.3 mg/dL  9.0  9.4   Total Protein 6.1 - 8.1 g/dL 7.4   7.6   Total Bilirubin 0.2 - 1.2 mg/dL 0.6   0.6   AST 10 - 35 U/L 24   22   ALT 9 - 46 U/L 24   16     Lipid Panel     Component Value Date/Time   CHOL 154 03/07/2024 0938   TRIG 82 03/07/2024 0938   HDL 69 03/07/2024 0938   CHOLHDL 2.2 03/07/2024 0938   LDLCALC 69 03/07/2024 0938    CBC    Component Value Date/Time   WBC 5.4 02/13/2024 1052   RBC 4.48 02/13/2024 1052   HGB 13.5 02/13/2024 1052   HGB 12.3 (L) 07/21/2021 1531   HCT 41.1 02/13/2024 1052   HCT 36.9 (L) 07/21/2021 1531   PLT 221 02/13/2024 1052   PLT 277 07/21/2021 1531   MCV 91.7 02/13/2024 1052   MCV 90 07/21/2021 1531   MCH 30.1 02/13/2024 1052   MCHC 32.8 02/13/2024 1052   RDW 12.9 02/13/2024 1052   RDW 17.7 (H) 07/21/2021 1531   LYMPHSABS 1.3 02/13/2024 1052   LYMPHSABS 1.7 07/21/2021 1531   MONOABS 0.4 02/13/2024 1052   EOSABS 0.3 02/13/2024 1052   EOSABS 1.1 (H) 07/21/2021 1531   BASOSABS 0.0 02/13/2024 1052   BASOSABS 0.0 07/21/2021 1531    Lab Results  Component Value Date   HGBA1C 4.8 07/21/2021       Assessment & Plan Umbilical hernia Suspected umbilical hernia with palpable, tender knot. Differential includes hernia; imaging needed for confirmation. -No symptoms or evidence of obstruction - Ordered CT scan of abdomen for confirmation. - If confirmed, refer to surgeon for evaluation and  management.  Urinary symptoms Strong-smelling urine without dysuria or increased frequency. Normal urinalysis, UTI ruled out. - Advised increased fluid intake.  General Health Maintenance Colonoscopy referral pending, not yet completed. - Provided colonoscopy referral contact information. - Encouraged scheduling of colonoscopy.    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  Urgent referral has been placed to pulmonary.   No orders of the defined types were placed in this encounter.   Follow-up: Return for previously scheduled appointment.       Corrina Sabin, MD, FAAFP. [facility] [place], [place] [phone]   04/10/2024, 1:14 PM

## 2024-04-10 NOTE — Telephone Encounter (Signed)
 Copied from CRM #[id number]. Topic: Clinical - Lab/Test Results >> Apr 10, 2024 10:15 AM Burnard DEL wrote:  Reason for CRM: [facility] health radiology called to make sure provider has received and reviewed patient results from lung screening CT that was completed on 04/02/2024.

## 2024-04-11 ENCOUNTER — Encounter: Payer: Self-pay | Admitting: Internal Medicine

## 2024-04-13 ENCOUNTER — Inpatient Hospital Stay: Admission: RE | Admit: 2024-04-13 | Discharge: 2024-04-13 | Attending: Family Medicine | Admitting: Family Medicine

## 2024-04-13 DIAGNOSIS — R109 Unspecified abdominal pain: Secondary | ICD-10-CM | POA: Diagnosis not present

## 2024-04-13 DIAGNOSIS — K429 Umbilical hernia without obstruction or gangrene: Secondary | ICD-10-CM

## 2024-04-13 MED ORDER — IOPAMIDOL (ISOVUE-300) INJECTION 61%
100.0000 mL | Freq: Once | INTRAVENOUS | Status: AC | PRN
  Administered 2024-04-13: 100 mL via INTRAVENOUS

## 2024-04-17 ENCOUNTER — Ambulatory Visit

## 2024-04-17 ENCOUNTER — Telehealth: Payer: Self-pay | Admitting: Family Medicine

## 2024-04-17 VITALS — BP 126/72 | HR 64 | Temp 98.3°F | Ht 72.0 in | Wt 177.8 lb

## 2024-04-17 DIAGNOSIS — F17210 Nicotine dependence, cigarettes, uncomplicated: Secondary | ICD-10-CM | POA: Diagnosis not present

## 2024-04-17 DIAGNOSIS — R911 Solitary pulmonary nodule: Secondary | ICD-10-CM | POA: Diagnosis not present

## 2024-04-17 DIAGNOSIS — Z720 Tobacco use: Secondary | ICD-10-CM

## 2024-04-17 DIAGNOSIS — Z21 Asymptomatic human immunodeficiency virus [HIV] infection status: Secondary | ICD-10-CM | POA: Diagnosis not present

## 2024-04-17 NOTE — Telephone Encounter (Signed)
 Patient was called and informed that results from CT scan has not been uploaded at this time and he will be called once results are in.

## 2024-04-17 NOTE — Patient Instructions (Signed)
  VISIT SUMMARY: During your visit, we discussed the new spot found on your right lung, your smoking habits, and your overall lung health. We also reviewed your history of HIV, hernias, and vision issues. We focused on the importance of monitoring the lung nodule and strategies for smoking cessation to improve your health.  YOUR PLAN: -SOLITARY PULMONARY NODULE OF THE RIGHT LUNG: A solitary pulmonary nodule is a small, round growth in the lung that can be caused by various factors, including inflammation or infection. We will monitor this nodule with a repeat CT scan in two months. If the nodule persists, we may consider a PET scan or biopsy. It is important to stop smoking to help resolve the nodule.  -TOBACCO USE DISORDER: Tobacco use disorder is a condition where a person is dependent on tobacco. Quitting smoking is crucial for your lung health and to help resolve the lung nodule. We recommend using Wellbutrin , nicotine patches, and lozenges to help you quit. You can also contact the [place]  quit line for free nicotine patches and gum. Additionally, consider using marijuana edibles instead of smoking to avoid further lung damage.  INSTRUCTIONS: Please schedule a repeat CT scan in two months to monitor the lung nodule. Follow the smoking cessation plan using Wellbutrin , nicotine patches, and lozenges, and contact the [place]  quit line for additional support. If you have any new symptoms or concerns, please contact our office.  Call 1-800-quit-NOW [phone] 596-9263) to get free nicotine replacement and counseling from the state of [place]      Contains text generated by [employer].

## 2024-04-17 NOTE — Progress Notes (Signed)
 Subjective:   PATIENT ID: Mason Moran GENDER: male DOB: 02/16/1971, MRN: [id number]   HPI Discussed the use of AI scribe software for clinical note transcription with the patient, who gave verbal consent to proceed.  History of Present Illness Mason Moran is a 53 year old male with HIV who presents with a new nodular infiltrate on the right lung.  A recent scan revealed a new spot on the tip of his right lung, which was not present in a scan from the previous year. He has a history of smoking and recently resumed smoking cigarettes, consuming about a pack every two days. He is on Wellbutrin , taking one pill daily.  He has been HIV positive for over 20 years and is currently on HIV medications, which he takes daily. He is concerned about maintaining privacy regarding his HIV status from those accompanying him.  He experiences shortness of breath, particularly when lying down, which he attributes to his hernias. He has a history of multiple hernias in his abdomen, which he feels are affecting his breathing. He also mentions coughing up thick sputum, which started about a month ago. No hemoptysis.  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 He uses marijuana to help with his vision issues, stating that it helps him see clearly.  He reports strong-smelling urine but denies any burning sensation. He recently had a yeast infection and is concerned about the possibility of another infection.     Past Medical History:  Diagnosis Date   HIV (human immunodeficiency virus infection) (HCC)      Family History  Problem Relation Age of Onset   Lung cancer Mother      Social History   Socioeconomic History   Marital status: Single    Spouse name: Not on file   Number of children: Not on file   Years of education: Not on file   Highest education level: Not on file  Occupational History   Not on file  Tobacco Use    Smoking status: Every Day    Current packs/day: 1.00    Average packs/day: 1 pack/day for 37.0 years (37.0 ttl pk-yrs)    Types: Cigarettes    Start date: 04/18/1987   Smokeless tobacco: Never  Vaping Use   Vaping status: Never Used  Substance and Sexual Activity   Alcohol use: Not Currently   Drug use: Yes    Types: Marijuana    Comment: daily   Sexual activity: Not Currently    Comment: declined condoms  Other Topics Concern   Not on file  Social History Narrative   Not on file   Social Drivers of Health   Financial Resource Strain: Low Risk  (05/24/2023)   Overall Financial Resource Strain (CARDIA)    Difficulty of Paying Living Expenses: Not very hard  Food Insecurity: No Food Insecurity (05/24/2023)   Hunger Vital Sign    Worried About Running Out of Food in the Last Year: Never true    Ran Out of Food in the Last Year: Never true  Transportation Needs: No Transportation Needs (05/24/2023)   PRAPARE - Administrator, Civil Service (Medical): No    Lack of Transportation (Non-Medical): No  Physical Activity: Sufficiently Active (05/24/2023)   Exercise Vital Sign    Days of Exercise per Week: 7 days    Minutes of Exercise per Session: 30 min  Stress: No Stress Concern Present (  05/24/2023)   [employer] - Occupational Stress Questionnaire    Feeling of Stress : Not at all  Social Connections: Moderately Integrated (05/24/2023)   Social Connection and Isolation Panel    Frequency of Communication with Friends and Family: Twice a week    Frequency of Social Gatherings with Friends and Family: More than three times a week    Attends Religious Services: 1 to 4 times per year    Active Member of [employer] or Organizations: Yes    Attends Banker Meetings: More than 4 times per year    Marital Status: Never married  Intimate Partner Violence: Not At Risk (05/24/2023)   Humiliation, Afraid, Rape, and Kick questionnaire    Fear  of Current or Ex-Partner: No    Emotionally Abused: No    Physically Abused: No    Sexually Abused: No     Allergies  Allergen Reactions   Dapsone Other (See Comments)    Other reaction(s): Other (See Comments) G6PD deficiency   Other Other (See Comments)    Seafood - can't speak   Primaquine     Other reaction(s): Other (See Comments) G6PD deficiency     Outpatient Medications Prior to Visit  Medication Sig Dispense Refill   albuterol  (VENTOLIN  HFA) 108 (90 Base) MCG/ACT inhaler Inhale 2 puffs into the lungs every 6 (six) hours as needed for wheezing or shortness of breath. 8 g 2   bictegravir-emtricitabine -tenofovir  AF (BIKTARVY ) 50-200-25 MG TABS tablet Take 1 tablet by mouth daily. 30 tablet 11   buPROPion  (WELLBUTRIN  XL) 150 MG 24 hr tablet Take 1 tablet (150 mg total) by mouth daily. For smoking cessation 90 tablet 1   minocycline (MINOCIN) 100 MG capsule Take 100 mg by mouth 2 (two) times daily.     Misc. [employer] Diagnosis- lower back pain 1 each 0   rizatriptan  (MAXALT ) 10 MG tablet Take 1 tablet (10 mg total) by mouth as needed for migraine. May repeat in 2 hours if needed 10 tablet 1   rosuvastatin  (CRESTOR ) 10 MG tablet Take 1 tablet (10 mg total) by mouth daily. 30 tablet 11   terbinafine  (LAMISIL ) 250 MG tablet Take 1 tablet (250 mg total) by mouth daily. 30 tablet 1   No facility-administered medications prior to visit.    ROS Reviewed all systems and reported negative except as above     Objective:   Vitals:   04/17/24 0953  BP: 126/72  Pulse: 64  Temp: 98.3 F (36.8 C)  TempSrc: Oral  SpO2: 99%  Weight: 177 lb 12.8 oz (80.6 kg)  Height: 6' (1.829 m)    Physical Exam Physical Exam GENERAL: Appropriate to age, no acute distress. HEAD EYES EARS NOSE THROAT: Moist mucous membranes, atraumatic, normocephalic. CHEST: Clear to auscultation bilaterally, no wheezing, no crackles, no rales. CARDIAC: Regular rate and rhythm, normal S1, normal  S2, no murmurs, no rubs, no gallops. ABDOMEN: Soft, nontender. NEUROLOGICAL: Motor and sensation grossly intact, alert and oriented times X 3. EXTREMITIES: Warm, well perfused, no edema.     CBC    Component Value Date/Time   WBC 5.4 02/13/2024 1052   RBC 4.48 02/13/2024 1052   HGB 13.5 02/13/2024 1052   HGB 12.3 (L) 07/21/2021 1531   HCT 41.1 02/13/2024 1052   HCT 36.9 (L) 07/21/2021 1531   PLT 221 02/13/2024 1052   PLT 277 07/21/2021 1531   MCV 91.7 02/13/2024 1052   MCV 90 07/21/2021 1531  MCH 30.1 02/13/2024 1052   MCHC 32.8 02/13/2024 1052   RDW 12.9 02/13/2024 1052   RDW 17.7 (H) 07/21/2021 1531   LYMPHSABS 1.3 02/13/2024 1052   LYMPHSABS 1.7 07/21/2021 1531   MONOABS 0.4 02/13/2024 1052   EOSABS 0.3 02/13/2024 1052   EOSABS 1.1 (H) 07/21/2021 1531   BASOSABS 0.0 02/13/2024 1052   BASOSABS 0.0 07/21/2021 1531     Chest imaging: I reviewed his CT chest performed on 04/02/2024 which shows multiple right apical densities the largest measuring 2 cm concerning for malignancy versus an inflammatory process.  He is also noted to have a left lower lobe nodule that appears stable and unchanged from prior exam. There is also noted multiple nodules or lobe nodules that are subcentimeter in his lower lobes.       Assessment & Plan:   Assessment and Plan Assessment & Plan Multiple pulmonary nodule, of which the most concerning is in the right apex measuring up to 2 cm New flat solitary pulmonary nodule in the right lung, differential diagnosis includes bronchogenic carcinoma and a patient with HIV versus inflammatory lesions.  Notably his pulmonary emphysema has a slightly thicker wall and could represent Langerhans although less likely.  Discussed with the patient options of treatment including bronchoscopy and biopsy versus obtaining PET scan versus repeat CT in 2 months.  After discussion with the patient about the options he would like to stop smoking cigarettes and  repeat the CT in 2 months to reassess which is a reasonable option.  Discussed that if his lesions persist on CT after 2 months we will plan for a PET scan and biopsy.  - Ordered repeat CT scan in two months to monitor nodule. - Advised smoking cessation to aid in nodule resolution. - Discussed potential for PET scan and biopsy if nodule persists.  Tobacco use disorder Chronic tobacco use disorder with recent relapse. Smoking cessation critical for lung health and nodule resolution. Previous Wellbutrin  use noted, additional support required. Discussed marijuana edibles to avoid lung damage. - Advised smoking cessation using Wellbutrin , nicotine patches, and lozenges. - Provided [place]  quit line number for free nicotine patches and gum. - Encouraged stopping smoking marijuana.  Smoking/Tobacco Cessation Counseling Arty Lantzy is a current user of tobacco or nicotine products. He is ready to quit at this time. Counseling provided today addressed the risks of continued use and the benefits of cessation. Discussed tobacco/nicotine use history, readiness to quit, and evidence-based treatment options including behavioral strategies, support resources, and pharmacologic therapies. Provided encouragement and educational materials on steps and resources to quit smoking. Patient questions were addressed, and follow-up recommended for continued support. Total time spent on counseling: 3 minutes.           Zola Herter, MD [facility] Office: [phone]

## 2024-04-17 NOTE — Telephone Encounter (Signed)
 Copied from CRM #[id number]. Topic: Clinical - Lab/Test Results >> Apr 17, 2024  3:26 PM Larissa S wrote:  Reason for CRM: Patient requesting Xray results.

## 2024-04-20 ENCOUNTER — Ambulatory Visit: Payer: Self-pay | Admitting: Family Medicine

## 2024-04-24 ENCOUNTER — Ambulatory Visit

## 2024-04-24 VITALS — Ht 72.0 in | Wt 172.0 lb

## 2024-04-24 DIAGNOSIS — Z1211 Encounter for screening for malignant neoplasm of colon: Secondary | ICD-10-CM

## 2024-04-24 MED ORDER — PEG 3350-KCL-NA BICARB-NACL 420 G PO SOLR: 4000.0000 mL | Freq: Once | ORAL | 0 refills | Status: AC

## 2024-04-24 NOTE — Progress Notes (Signed)
 Pre visit completed via phone call; Patient verified name, DOB, and address; No egg or soy allergy known to patient;  No issues known to pt with past sedation with any surgeries or procedures; Patient denies ever being told they had issues or difficulty with intubation;  No FH of Malignant Hyperthermia; Pt is not on diet pills; Pt is not on home 02;  Pt is not on blood thinners;  Pt denies issues with constipation; No A fib or A flutter; Have any cardiac testing pending--NO Insurance verified during PV appt--- [employer] Medicare and Medicaid Pt can ambulate without assistance;  Pt denies use of chewing tobacco; Discussed diabetic/weight loss medication holds; Discussed NSAID holds; Checked BMI to be less than 50; Pt instructed to use Single[employer] or GoodRx for a price reduction on prep  Patient's chart reviewed by Norleen Schillings CNRA prior to previsit and patient appropriate for the [facility];  Pre visit completed and red dot placed by patient's name on their procedure day (on provider's schedule);   Instructions printed and put on 2nd floor for patient to pick up per his request;

## 2024-05-01 ENCOUNTER — Ambulatory Visit (INDEPENDENT_AMBULATORY_CARE_PROVIDER_SITE_OTHER): Admitting: Podiatry

## 2024-05-01 ENCOUNTER — Encounter: Payer: Self-pay | Admitting: Podiatry

## 2024-05-01 VITALS — Ht 72.0 in | Wt 172.0 lb

## 2024-05-01 DIAGNOSIS — B351 Tinea unguium: Secondary | ICD-10-CM

## 2024-05-01 DIAGNOSIS — B353 Tinea pedis: Secondary | ICD-10-CM

## 2024-05-01 DIAGNOSIS — M79672 Pain in left foot: Secondary | ICD-10-CM

## 2024-05-01 DIAGNOSIS — M79671 Pain in right foot: Secondary | ICD-10-CM | POA: Diagnosis not present

## 2024-05-01 MED ORDER — TERBINAFINE HCL 250 MG PO TABS: 250.0000 mg | ORAL_TABLET | Freq: Every day | ORAL | 0 refills | Status: AC

## 2024-05-01 NOTE — Progress Notes (Signed)
" ° °  Subjective:    HPI Presents for follow-up onychomycosis treatment with p.o. Lamisil .  No problems taking medicine with no side effects noted.  Tenderness around toes with walking and wearing shoes.   Objective:  Physical Exam   General: AAO x3, NAD  Vascular: DP and PT pulses palpable bilaterally.  Immedate capillary fill time digits. No significant lower extremity edema bilaterally.  Dermatological: Onychomycotic mycotic changes nails 1 through 5 with discoloration nail and subungual debris and thickening of the nail. 0% Clearance of onychomycotic nail changes noted. Tenderness with walking and wearing shoes.  Chronic tinea pedis is 60 to 70% improved which much less scaling and redness present now.  Neruologic: Grossly intact B/L  Musculoskeletal:   Assessment:  Painful onychomycotic nails 1 through 5 bilaterally. Pain feet b/l Tinea pedis bilaterally     Plan:  -Established office visit level 3 for evaluation and management -Patient is tolerating Lamisil  treatment for onychomycotic nails well.  No side effects noted. Will continue this treatment. -Rx: Lamisil  250 mg p.o. daily - Labs ordered today for liver function tests to monitor for any hepatic side effects from the Lamisil .  - Return in 8 weeks for  Lamisil  final  "

## 2024-05-02 LAB — HEPATIC FUNCTION PANEL
AG Ratio: 1.5 (calc) (ref 1.0–2.5)
ALT: 41 U/L (ref 9–46)
AST: 54 U/L — ABNORMAL HIGH (ref 10–35)
Albumin: 4.6 g/dL (ref 3.6–5.1)
Alkaline phosphatase (APISO): 78 U/L (ref 35–144)
Bilirubin, Direct: 0.2 mg/dL (ref 0.0–0.2)
Globulin: 3.1 g/dL (ref 1.9–3.7)
Indirect Bilirubin: 0.5 mg/dL (ref 0.2–1.2)
Total Bilirubin: 0.7 mg/dL (ref 0.2–1.2)
Total Protein: 7.7 g/dL (ref 6.1–8.1)

## 2024-05-08 ENCOUNTER — Encounter: Payer: Self-pay | Admitting: Internal Medicine

## 2024-05-08 ENCOUNTER — Ambulatory Visit (AMBULATORY_SURGERY_CENTER): Admitting: Internal Medicine

## 2024-05-08 VITALS — BP 125/80 | HR 65 | Temp 97.4°F | Resp 12 | Ht 72.0 in | Wt 172.0 lb

## 2024-05-08 DIAGNOSIS — K648 Other hemorrhoids: Secondary | ICD-10-CM | POA: Diagnosis not present

## 2024-05-08 DIAGNOSIS — Z1211 Encounter for screening for malignant neoplasm of colon: Secondary | ICD-10-CM | POA: Diagnosis present

## 2024-05-08 MED ORDER — SODIUM CHLORIDE 0.9 % IV SOLN: 500.0000 mL | Freq: Once | INTRAVENOUS | Status: DC

## 2024-05-08 NOTE — Op Note (Signed)
 [facility] Patient Name: Mason Moran Procedure Date: 05/08/2024 11:10 AM MRN: [id number] Endoscopist: Norleen SAILOR. Abran , MD, [id number] Age: 53 Referring MD:  Date of Birth: [birth year] Gender: Male Account #: [id number] Procedure:                Colonoscopy Indications:              Screening for colorectal malignant neoplasm Medicines:                Monitored Anesthesia Care Procedure:                Pre-Anesthesia Assessment:                           - Prior to the procedure, a History and Physical                            was performed, and patient medications and                            allergies were reviewed. The patient's tolerance of                            previous anesthesia was also reviewed. The risks                            and benefits of the procedure and the sedation                            options and risks were discussed with the patient.                            All questions were answered, and informed consent                            was obtained. Prior Anticoagulants: The patient has                            taken no anticoagulant or antiplatelet agents.                            After reviewing the risks and benefits, the patient                            was deemed in satisfactory condition to undergo the                            procedure.                           After obtaining informed consent, the colonoscope                            was passed under direct vision. Throughout the  procedure, the patient's blood pressure, pulse, and                            oxygen saturations were monitored continuously. The                            Olympus Scope SN [id number] was introduced through the                            anus and advanced to the the cecum, identified by                            appendiceal orifice and ileocecal valve. The                            ileocecal valve, appendiceal  orifice, and rectum                            were photographed. The quality of the bowel                            preparation was excellent. The colonoscopy was                            performed without difficulty. The patient tolerated                            the procedure well. The bowel preparation used was                            SUPREP via split dose instruction. Scope In: 11:18:15 AM Scope Out: 11:28:02 AM Scope Withdrawal Time: 0 hours 7 minutes 20 seconds  Total Procedure Duration: 0 hours 9 minutes 47 seconds  Findings:                 Internal hemorrhoids were found during                            retroflexion. The hemorrhoids were moderate.                           The exam was otherwise without abnormality on                            direct and retroflexion views. Complications:            No immediate complications. Estimated blood loss:                            None. Estimated Blood Loss:     Estimated blood loss: none. Impression:               - Internal hemorrhoids.                           - The examination was otherwise normal on direct  and retroflexion views.                           - No specimens collected. Recommendation:           - Repeat colonoscopy in 10 years for screening                            purposes.                           - Patient has a contact number available for                            emergencies. The signs and symptoms of potential                            delayed complications were discussed with the                            patient. Return to normal activities tomorrow.                            Written discharge instructions were provided to the                            patient.                           - Resume previous diet.                           - Continue present medications. Norleen SAILOR. Abran, MD 05/08/2024 11:31:45 AM This report has been signed electronically.

## 2024-05-08 NOTE — Progress Notes (Deleted)
 Transferred to PACU via stretcher.  Not responding to stimulation at this time.  VSS upon leaving procedure room.

## 2024-05-08 NOTE — Patient Instructions (Signed)
   HANDOUT ON HEMORRHOIDS GIVEN TO YOU TODAY   YOU HAD AN ENDOSCOPIC PROCEDURE TODAY AT [facility]:   Refer to the procedure report that was given to you for any specific questions about what was found during the examination.  If the procedure report does not answer your questions, please call your gastroenterologist to clarify.  If you requested that your care partner not be given the details of your procedure findings, then the procedure report has been included in a sealed envelope for you to review at your convenience later.  YOU SHOULD EXPECT: Some feelings of bloating in the abdomen. Passage of more gas than usual.  Walking can help get rid of the air that was put into your GI tract during the procedure and reduce the bloating. If you had a lower endoscopy (such as a colonoscopy or flexible sigmoidoscopy) you may notice spotting of blood in your stool or on the toilet paper. If you underwent a bowel prep for your procedure, you may not have a normal bowel movement for a few days.  Please Note:  You might notice some irritation and congestion in your nose or some drainage.  This is from the oxygen used during your procedure.  There is no need for concern and it should clear up in a day or so.  SYMPTOMS TO REPORT IMMEDIATELY:  Following lower endoscopy (colonoscopy or flexible sigmoidoscopy):  Excessive amounts of blood in the stool  Significant tenderness or worsening of abdominal pains  Swelling of the abdomen that is new, acute  Fever of 100F or higher  For urgent or emergent issues, a gastroenterologist can be reached at any hour by calling [phone]. Do not use MyChart messaging for urgent concerns.    DIET:  We do recommend a small meal at first, but then you may proceed to your regular diet.  Drink plenty of fluids but you should avoid alcoholic beverages for 24 hours.  ACTIVITY:  You should plan to take it easy for the rest of today and you should NOT DRIVE  or use heavy machinery until tomorrow (because of the sedation medicines used during the test).    FOLLOW UP: Our staff will call the number listed on your records the next business day following your procedure.  We will call around 7:15- 8:00 am to check on you and address any questions or concerns that you may have regarding the information given to you following your procedure. If we do not reach you, we will leave a message.     If any biopsies were taken you will be contacted by phone or by letter within the next 1-3 weeks.  Please call us at [phone] if you have not heard about the biopsies in 3 weeks.    SIGNATURES/CONFIDENTIALITY: You and/or your care partner have signed paperwork which will be entered into your electronic medical record.  These signatures attest to the fact that that the information above on your After Visit Summary has been reviewed and is understood.  Full responsibility of the confidentiality of this discharge information lies with you and/or your care-partner.

## 2024-05-08 NOTE — Progress Notes (Signed)
 HISTORY OF PRESENT ILLNESS:  Mason Moran is a 53 y.o. male sent direct for screening colonoscopy.  No complaints  REVIEW OF SYSTEMS:  All non-GI ROS negative except for  Past Medical History:  Diagnosis Date   HIV (human immunodeficiency virus infection) (HCC)    Neuropathy     History reviewed. No pertinent surgical history.  Social History [name]  reports that he has quit smoking. His smoking use included cigarettes. He has never used smokeless tobacco. He reports that he does not currently use alcohol. He reports current drug use. Frequency: 7.00 times per week. Drug: Marijuana.  family history includes Breast cancer (age of onset: 77) in his mother; Lung cancer (age of onset: 83) in his mother.  Allergies[1]     PHYSICAL EXAMINATION: Vital signs: BP 125/82   Pulse 72   Temp (!) 97.4 F (36.3 C) (Temporal)   Ht 6' (1.829 m)   Wt 172 lb (78 kg)   SpO2 98%   BMI 23.33 kg/m  General: Well-developed, well-nourished, no acute distress HEENT: Sclerae are anicteric, conjunctiva pink. Oral mucosa intact Lungs: Clear Heart: Regular Abdomen: soft, nontender, nondistended, no obvious ascites, no peritoneal signs, normal bowel sounds. No organomegaly. Extremities: No edema Psychiatric: alert and oriented x3. Cooperative     ASSESSMENT:  Colon cancer screening   PLAN:  Screening colonoscopy         [1]  Allergies Allergen Reactions   Other Other (See Comments)    Seafood - can't speak   Dapsone Other (See Comments)    Other reaction(s): Other (See Comments) G6PD deficiency   Primaquine Other (See Comments)    Other reaction(s): Other (See Comments) G6PD deficiency

## 2024-05-08 NOTE — Progress Notes (Signed)
 Transferred to PACU via stretcher.  Not responding to stimulation at this time.  VSS upon leaving procedure room.

## 2024-05-11 ENCOUNTER — Telehealth: Payer: Self-pay

## 2024-05-11 NOTE — Telephone Encounter (Signed)
" °  Follow up Call-     05/08/2024   10:28 AM  Call back number  Post procedure Call Back phone  # [phone]  Permission to leave phone message Yes     Patient questions:  Do you have a fever, pain , or abdominal swelling? No. Pain Score  0 *  Have you tolerated food without any problems? Yes.    Have you been able to return to your normal activities? Yes.    Do you have any questions about your discharge instructions: Diet   No. Medications  No. Follow up visit  No.  Do you have questions or concerns about your Care? No.  Actions: * If pain score is 4 or above: No action needed, pain <4.   "

## 2024-05-22 NOTE — Progress Notes (Signed)
 The 10-year ASCVD risk score (Arnett DK, et al., 2019) is: 5.2%   Values used to calculate the score:     Age: 54 years     Clinically relevant sex: Male     Is Non-Hispanic African American: Yes     Diabetic: No     Tobacco smoker: No     Systolic Blood Pressure: 125 mmHg     Is BP treated: No     HDL Cholesterol: 69 mg/dL     Total Cholesterol: 154 mg/dL  Currently prescribed rosuvastatin  10 mg.  Jevon Shells, BSN, RN

## 2024-06-06 ENCOUNTER — Ambulatory Visit: Admitting: Internal Medicine

## 2024-06-06 ENCOUNTER — Encounter: Payer: Self-pay | Admitting: Internal Medicine

## 2024-06-06 ENCOUNTER — Other Ambulatory Visit: Payer: Self-pay

## 2024-06-06 VITALS — BP 132/89 | HR 71 | Temp 97.5°F | Ht 72.0 in | Wt 172.0 lb

## 2024-06-06 DIAGNOSIS — Z79899 Other long term (current) drug therapy: Secondary | ICD-10-CM | POA: Diagnosis not present

## 2024-06-06 DIAGNOSIS — Z113 Encounter for screening for infections with a predominantly sexual mode of transmission: Secondary | ICD-10-CM | POA: Diagnosis not present

## 2024-06-06 DIAGNOSIS — B20 Human immunodeficiency virus [HIV] disease: Secondary | ICD-10-CM

## 2024-06-06 DIAGNOSIS — R911 Solitary pulmonary nodule: Secondary | ICD-10-CM

## 2024-06-06 DIAGNOSIS — Z716 Tobacco abuse counseling: Secondary | ICD-10-CM

## 2024-06-06 DIAGNOSIS — F17210 Nicotine dependence, cigarettes, uncomplicated: Secondary | ICD-10-CM | POA: Diagnosis not present
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
## 2024-06-07 LAB — T-HELPER CELL (CD4) - (RCID CLINIC ONLY)
CD4 % Helper T Cell: 19 % — ABNORMAL LOW (ref 33–65)
CD4 T Cell Abs: 282 /uL — ABNORMAL LOW (ref 400–1790)

## 2024-06-08 LAB — SYPHILIS: RPR W/REFLEX TO RPR TITER AND TREPONEMAL ANTIBODIES, TRADITIONAL SCREENING AND DIAGNOSIS ALGORITHM: RPR Ser Ql: NONREACTIVE

## 2024-06-08 LAB — COMPLETE METABOLIC PANEL WITHOUT GFR
AG Ratio: 1.4 (calc) (ref 1.0–2.5)
ALT: 24 U/L (ref 9–46)
AST: 25 U/L (ref 10–35)
Albumin: 4.8 g/dL (ref 3.6–5.1)
Alkaline phosphatase (APISO): 70 U/L (ref 35–144)
BUN: 11 mg/dL (ref 7–25)
CO2: 31 mmol/L (ref 20–32)
Calcium: 10 mg/dL (ref 8.6–10.3)
Chloride: 104 mmol/L (ref 98–110)
Creat: 1.09 mg/dL (ref 0.70–1.30)
Globulin: 3.4 g/dL (ref 1.9–3.7)
Glucose, Bld: 72 mg/dL (ref 65–99)
Potassium: 4.5 mmol/L (ref 3.5–5.3)
Sodium: 140 mmol/L (ref 135–146)
Total Bilirubin: 0.6 mg/dL (ref 0.2–1.2)
Total Protein: 8.2 g/dL — ABNORMAL HIGH (ref 6.1–8.1)

## 2024-06-08 LAB — CBC WITH DIFFERENTIAL/PLATELET
Absolute Lymphocytes: 1585 {cells}/uL (ref 850–3900)
Absolute Monocytes: 410 {cells}/uL (ref 200–950)
Basophils Absolute: 40 {cells}/uL (ref 0–200)
Basophils Relative: 0.8 %
Eosinophils Absolute: 150 {cells}/uL (ref 15–500)
Eosinophils Relative: 3 %
HCT: 44.8 % (ref 39.4–51.1)
Hemoglobin: 14.3 g/dL (ref 13.2–17.1)
MCH: 29.5 pg (ref 27.0–33.0)
MCHC: 31.9 g/dL (ref 31.6–35.4)
MCV: 92.6 fL (ref 81.4–101.7)
MPV: 10.9 fL (ref 7.5–12.5)
Monocytes Relative: 8.2 %
Neutro Abs: 2815 {cells}/uL (ref 1500–7800)
Neutrophils Relative %: 56.3 %
Platelets: 279 10*3/uL (ref 140–400)
RBC: 4.84 Million/uL (ref 4.20–5.80)
RDW: 12 % (ref 11.0–15.0)
Total Lymphocyte: 31.7 %
WBC: 5 10*3/uL (ref 3.8–10.8)

## 2024-06-08 LAB — HIV-1 RNA QUANT-NO REFLEX-BLD
HIV 1 RNA Quant: NOT DETECTED {copies}/mL
HIV-1 RNA Quant, Log: NOT DETECTED {Log_copies}/mL

## 2024-06-08 NOTE — Addendum Note (Signed)
 Addended by: LUIZ CHANNEL on: 06/08/2024 04:34 PM   Modules accepted: Level of Service

## 2024-06-15 ENCOUNTER — Other Ambulatory Visit: Payer: Self-pay

## 2024-06-15 ENCOUNTER — Emergency Department (HOSPITAL_BASED_OUTPATIENT_CLINIC_OR_DEPARTMENT_OTHER)
Admission: EM | Admit: 2024-06-15 | Discharge: 2024-06-15 | Disposition: A | Discharge status: Home / Self Care | Source: Home / Self Care | Attending: Emergency Medicine | Admitting: Emergency Medicine

## 2024-06-15 ENCOUNTER — Encounter (HOSPITAL_BASED_OUTPATIENT_CLINIC_OR_DEPARTMENT_OTHER): Payer: Self-pay

## 2024-06-15 DIAGNOSIS — M545 Low back pain, unspecified: Secondary | ICD-10-CM

## 2024-06-15 DIAGNOSIS — M542 Cervicalgia: Secondary | ICD-10-CM

## 2024-06-15 MED ORDER — LIDOCAINE 5 % EX PTCH
1.0000 | MEDICATED_PATCH | CUTANEOUS | Status: AC
  Administered 2024-06-15: 1 via TRANSDERMAL
  Filled 2024-06-15: qty 1

## 2024-06-15 MED ORDER — ACETAMINOPHEN 500 MG PO TABS
1000.0000 mg | ORAL_TABLET | Freq: Once | ORAL | Status: AC
  Administered 2024-06-15: 1000 mg via ORAL
  Filled 2024-06-15: qty 2

## 2024-06-15 MED ORDER — LIDOCAINE 5 % EX PTCH: 1.0000 | MEDICATED_PATCH | CUTANEOUS | 0 refills | Status: AC

## 2024-06-15 MED ORDER — METHOCARBAMOL 500 MG PO TABS: 500.0000 mg | ORAL_TABLET | Freq: Two times a day (BID) | ORAL | 0 refills | Status: AC | PRN
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
## 2024-06-20 ENCOUNTER — Other Ambulatory Visit

## 2024-06-27 ENCOUNTER — Ambulatory Visit: Admitting: Podiatry

## 2024-07-05 ENCOUNTER — Ambulatory Visit

## 2024-07-17 ENCOUNTER — Ambulatory Visit: Payer: Self-pay

## 2024-08-29 ENCOUNTER — Ambulatory Visit: Payer: Self-pay | Admitting: Internal Medicine

## 2024-09-24 ENCOUNTER — Ambulatory Visit: Admitting: Family Medicine
